# Patient Record
Sex: Male | Born: 1955 | Race: White | Hispanic: No | Marital: Married | Smoking: Former smoker
Health system: Southern US, Community
[De-identification: ages and names within clinical notes are randomized; demographics above are authoritative.]

## PROBLEM LIST (undated history)

## (undated) DIAGNOSIS — M1990 Unspecified osteoarthritis, unspecified site: Secondary | ICD-10-CM

## (undated) DIAGNOSIS — C641 Malignant neoplasm of right kidney, except renal pelvis: Secondary | ICD-10-CM

## (undated) DIAGNOSIS — I10 Essential (primary) hypertension: Secondary | ICD-10-CM

## (undated) DIAGNOSIS — T7840XA Allergy, unspecified, initial encounter: Secondary | ICD-10-CM

## (undated) DIAGNOSIS — Z800 Family history of malignant neoplasm of digestive organs: Secondary | ICD-10-CM

## (undated) DIAGNOSIS — D649 Anemia, unspecified: Secondary | ICD-10-CM

## (undated) DIAGNOSIS — Z85528 Personal history of other malignant neoplasm of kidney: Secondary | ICD-10-CM

## (undated) DIAGNOSIS — Z8042 Family history of malignant neoplasm of prostate: Secondary | ICD-10-CM

## (undated) DIAGNOSIS — K219 Gastro-esophageal reflux disease without esophagitis: Secondary | ICD-10-CM

## (undated) DIAGNOSIS — E042 Nontoxic multinodular goiter: Secondary | ICD-10-CM

## (undated) HISTORY — DX: Unspecified osteoarthritis, unspecified site: M19.90

## (undated) HISTORY — PX: CHOLECYSTECTOMY: SHX55

## (undated) HISTORY — PX: MENISECTOMY: SHX5181

## (undated) HISTORY — PX: HERNIA REPAIR: SHX51

## (undated) HISTORY — PX: THYROIDECTOMY, PARTIAL: SHX18

## (undated) HISTORY — PX: MENISCUS REPAIR: SHX5179

## (undated) HISTORY — DX: Allergy, unspecified, initial encounter: T78.40XA

## (undated) HISTORY — PX: NEPHRECTOMY RADICAL: SUR878

## (undated) HISTORY — DX: Gastro-esophageal reflux disease without esophagitis: K21.9

## (undated) HISTORY — DX: Family history of malignant neoplasm of prostate: Z80.42

## (undated) HISTORY — DX: Nontoxic multinodular goiter: E04.2

## (undated) HISTORY — PX: INCISION AND DRAINAGE / EXCISION THYROGLOSSAL CYST: SUR667

## (undated) HISTORY — DX: Personal history of other malignant neoplasm of kidney: Z85.528

## (undated) HISTORY — DX: Malignant neoplasm of right kidney, except renal pelvis: C64.1

## (undated) HISTORY — DX: Family history of malignant neoplasm of digestive organs: Z80.0

## (undated) HISTORY — DX: Anemia, unspecified: D64.9

## (undated) HISTORY — DX: Essential (primary) hypertension: I10

---

## 2019-09-02 LAB — PROTIME-INR: INR: 1.1 (ref 0.9–1.1)

## 2019-09-16 ENCOUNTER — Encounter: Payer: Self-pay | Admitting: Gastroenterology

## 2019-10-31 ENCOUNTER — Ambulatory Visit (INDEPENDENT_AMBULATORY_CARE_PROVIDER_SITE_OTHER): Payer: Medicare Other | Admitting: Gastroenterology

## 2019-10-31 ENCOUNTER — Encounter (INDEPENDENT_AMBULATORY_CARE_PROVIDER_SITE_OTHER): Payer: Self-pay

## 2019-10-31 ENCOUNTER — Encounter: Payer: Self-pay | Admitting: Gastroenterology

## 2019-10-31 VITALS — BP 136/68 | HR 93 | Ht 77.0 in | Wt 270.0 lb

## 2019-10-31 DIAGNOSIS — D509 Iron deficiency anemia, unspecified: Secondary | ICD-10-CM

## 2019-10-31 MED ORDER — SUTAB 1479-225-188 MG PO TABS: 1.0000 | ORAL_TABLET | Freq: Once | ORAL | 0 refills | Status: AC

## 2019-10-31 NOTE — Patient Instructions (Signed)
If you are age 66 or older, your body mass index should be between 23-30. Your Body mass index is 32.02 kg/m. If this is out of the aforementioned range listed, please consider follow up with your Primary Care Provider.  If you are age 58 or younger, your body mass index should be between 19-25. Your Body mass index is 32.02 kg/m. If this is out of the aformentioned range listed, please consider follow up with your Primary Care Provider.   You have been scheduled for an endoscopy and colonoscopy. Please follow the written instructions given to you at your visit today. Please pick up your prep supplies at the pharmacy within the next 1-3 days. If you use inhalers (even only as needed), please bring them with you on the day of your procedure.  Due to recent changes in healthcare laws, you may see the results of your imaging and laboratory studies on MyChart before your provider has had a chance to review them.  We understand that in some cases there may be results that are confusing or concerning to you. Not all laboratory results come back in the same time frame and the provider may be waiting for multiple results in order to interpret others.  Please give [place] 48 hours in order for your provider to thoroughly review all the results before contacting the office for clarification of your results.

## 2019-10-31 NOTE — Progress Notes (Signed)
10/31/2019 John [name] [id number] April 18, 1955   HISTORY OF PRESENT ILLNESS: This is a pleasant 64 year old male who is new to our practice.  He has been referred here by his PCP, Dr. Tawny Asal, for evaluation regarding new issues with anemia.  He tells me that he was supposed to have a knee replacement performed, but now they will not clear him because he is anemic.  He tells me that last year his hemoglobin was around 14 g.  Then when it was checked several months ago it was down to 12.6 g.  More recently in [name] it was down to 10.6 g.  MCV is low at 78.4.  Iron studies are low with serum iron 14, iron saturation less than 4.  TIBC is elevated at 414.  He tells me that recently he has seen a little bit of bright red blood on the toilet paper and has seen that on occasion in the past, but no significant amounts of bleeding.  No black or tarry looking stools.  He admits to NSAID use on and off for arthritic type pains.  He has never had EGD and colonoscopy in the past.  His PCP recommended that he take iron supplements twice daily.  He admits that he is only started them once a day because he had issues with taking iron supplements back in the 90s when he had his nephrectomy performed.   Past Medical History:  Diagnosis Date  . Anemia   . Arthritis   . Cancer of right kidney ([place])   . Hypertension   . Multiple thyroid nodules    Past Surgical History:  Procedure Laterality Date  . CHOLECYSTECTOMY    . HERNIA REPAIR    . INCISION AND DRAINAGE / EXCISION THYROGLOSSAL CYST    . MENISCUS REPAIR Left   . MENISECTOMY Right   . NEPHRECTOMY RADICAL    . THYROIDECTOMY, PARTIAL      reports that he quit smoking about 28 years ago. He has never used smokeless tobacco. He reports current alcohol use. He reports that he does not use drugs. family history includes Colon cancer in his maternal grandfather; Prostate cancer in his maternal grandfather; Stroke in his maternal grandfather; Valvular heart  disease in his father. Allergies  Allergen Reactions  . Lisinopril Cough      Outpatient Encounter Medications as of 10/31/2019  Medication Sig  . Ferrous Sulfate (IRON PO) Take 1 tablet by mouth daily.  Marland Kitchen telmisartan (MICARDIS) 40 MG tablet Take 40 mg by mouth daily.   No facility-administered encounter medications on file as of 10/31/2019.    REVIEW OF SYSTEMS  : All other systems reviewed and negative except where noted in the History of Present Illness.  PHYSICAL EXAM: BP 136/68   Pulse 93   Ht 6\' 5"  (1.956 m)   Wt 270 lb (122.5 kg)   SpO2 98%   BMI 32.02 kg/m  General: Well developed white male in no acute distress Head: Normocephalic and atraumatic Eyes:  Sclerae anicteric, conjunctiva pink. Ears: Normal auditory acuity Lungs: Clear throughout to auscultation; no W/R/R. Heart: Regular rate and rhythm; no M/R/G. Abdomen: Soft, non-distended.  BS present.  Non-tender.  Previous laparotomy scar noted. Rectal:  Will be done at the time of colonoscopy. Musculoskeletal: Symmetrical with no gross deformities  Skin: No lesions on visible extremities Extremities: No edema  Neurological: Alert oriented x 4, grossly non-focal Psychological:  Alert and cooperative. Normal mood and affect  ASSESSMENT AND PLAN: *IDA: He  reports that hemoglobin just over a year ago was 14 grams, then when it was checked several months ago it was down to 12.6 grams and then more recently in [name] it was down to 10.6 g.  MCV is low at 78.  Iron studies are very low with serum iron 14, iron saturation less than 4, TIBC elevated at 414.  It was recommended that he take iron supplements twice daily, but he admits that he has only started taking it once once a day as he had issues with taking iron back in the 90s after his nephrectomy.  He has never had colonoscopy or endoscopy in the past.  We will plan for the both of those with Dr. Bryan Lemma to look for causes of anemia/GI blood loss.  We discussed the  possibility of trying IV iron.  He would like to discuss with his PCP first to see if there somewhere more local in [place], [place] that he could have that performed.The risks, benefits, and alternatives to colonoscopy and EGD were discussed with the patient and he consents to proceed.    CC:  Josem Kaufmann, MD

## 2019-11-01 ENCOUNTER — Telehealth: Payer: Self-pay | Admitting: Gastroenterology

## 2019-11-01 NOTE — Telephone Encounter (Signed)
LMOM for patient to call back.

## 2019-11-01 NOTE — Telephone Encounter (Signed)
LMOM #2 for patient to call back

## 2019-11-01 NOTE — Telephone Encounter (Signed)
Pt Is requesting a call back from a nurse to discuss his iron infusion.  Pt is available after 2pm

## 2019-11-04 NOTE — Telephone Encounter (Signed)
Spoke to patient who would like to start iron infusions per Jessica's Zehr's last OV note. Message sent to [facility] for new orders.

## 2019-11-04 NOTE — Progress Notes (Signed)
Agree with the assessment and plan as outlined by Alonza Bogus, PA-C.  Plan for evaluation of IDA with EGD with biopsies and colonoscopy as outlined.  Treatment with IV iron as outlined.  Sharna Gabrys, DO, [facility]

## 2019-11-05 ENCOUNTER — Telehealth: Payer: Self-pay

## 2019-11-05 ENCOUNTER — Other Ambulatory Visit: Payer: Self-pay | Admitting: Gastroenterology

## 2019-11-05 DIAGNOSIS — D509 Iron deficiency anemia, unspecified: Secondary | ICD-10-CM

## 2019-11-05 NOTE — Telephone Encounter (Signed)
Spoke to patient to inform him that a referral has been sent to hematology/oncology. They will notify him of a date and time for his initial iron infusion,then repeat in 2 weeks. All questions answered. Patient voiced understanding.

## 2019-11-05 NOTE — Telephone Encounter (Signed)
-----   Message from Loralie Champagne, PA-C sent at 11/05/2019 11:00 AM EDT ----- Regarding: RE: iron infusion That is fine.  I am used to using Feraheme with one dose and repeat in 2 weeks, but I know there had been issues with insurances covering it.  I guess start with that unless you see it is not covered then I guess just see what will be covered by his insurance.  Thank you,  Jess  ----- Message ----- From: Angie Fava, LPN Sent: 1/96/2229   2:19 PM EDT To: Loralie Champagne, PA-C Subject: iron infusion                                  Hey Jess, You saw this patient last week for IDA. It was discussed the possibility of iron infusions. Patient states that he is ready to start them. If you will send me the order then I'll get that set up. He will plan to do them here rather then [place].  Thanks, [employer]

## 2019-11-20 ENCOUNTER — Telehealth: Payer: Self-pay | Admitting: Gastroenterology

## 2019-11-20 NOTE — Telephone Encounter (Signed)
Pt called to cancel appt for blood work on 10/13 at 1:00pm. He was not sure of who to contact to cancel.

## 2019-11-21 ENCOUNTER — Encounter: Payer: Self-pay | Admitting: Gastroenterology

## 2019-11-21 NOTE — Telephone Encounter (Signed)
Spoke to patient who states he needs to cancel his iron infusion appointment for next week. He was asked to contact that office to let them know. The number was provided to patient. He will contact them and reschedule.

## 2019-11-27 ENCOUNTER — Inpatient Hospital Stay: Payer: Medicare Other

## 2019-11-27 ENCOUNTER — Ambulatory Visit: Payer: Medicare Other | Admitting: Family

## 2019-12-04 ENCOUNTER — Other Ambulatory Visit: Payer: Self-pay | Admitting: Gastroenterology

## 2019-12-04 ENCOUNTER — Ambulatory Visit (AMBULATORY_SURGERY_CENTER): Payer: Medicare Other | Admitting: Gastroenterology

## 2019-12-04 ENCOUNTER — Other Ambulatory Visit: Payer: Self-pay

## 2019-12-04 ENCOUNTER — Encounter: Payer: Self-pay | Admitting: Gastroenterology

## 2019-12-04 ENCOUNTER — Other Ambulatory Visit: Payer: Medicare Other

## 2019-12-04 ENCOUNTER — Telehealth: Payer: Self-pay | Admitting: Gastroenterology

## 2019-12-04 VITALS — BP 114/58 | HR 76 | Temp 98.2°F | Resp 18 | Ht 77.0 in | Wt 270.0 lb

## 2019-12-04 DIAGNOSIS — K6389 Other specified diseases of intestine: Secondary | ICD-10-CM

## 2019-12-04 DIAGNOSIS — C187 Malignant neoplasm of sigmoid colon: Secondary | ICD-10-CM

## 2019-12-04 DIAGNOSIS — Z1211 Encounter for screening for malignant neoplasm of colon: Secondary | ICD-10-CM

## 2019-12-04 DIAGNOSIS — K219 Gastro-esophageal reflux disease without esophagitis: Secondary | ICD-10-CM

## 2019-12-04 DIAGNOSIS — K5730 Diverticulosis of large intestine without perforation or abscess without bleeding: Secondary | ICD-10-CM

## 2019-12-04 DIAGNOSIS — K269 Duodenal ulcer, unspecified as acute or chronic, without hemorrhage or perforation: Secondary | ICD-10-CM

## 2019-12-04 DIAGNOSIS — D509 Iron deficiency anemia, unspecified: Secondary | ICD-10-CM

## 2019-12-04 DIAGNOSIS — K5140 Inflammatory polyps of colon without complications: Secondary | ICD-10-CM | POA: Diagnosis not present

## 2019-12-04 DIAGNOSIS — K922 Gastrointestinal hemorrhage, unspecified: Secondary | ICD-10-CM

## 2019-12-04 DIAGNOSIS — D125 Benign neoplasm of sigmoid colon: Secondary | ICD-10-CM

## 2019-12-04 DIAGNOSIS — K259 Gastric ulcer, unspecified as acute or chronic, without hemorrhage or perforation: Secondary | ICD-10-CM

## 2019-12-04 DIAGNOSIS — K642 Third degree hemorrhoids: Secondary | ICD-10-CM

## 2019-12-04 DIAGNOSIS — K3189 Other diseases of stomach and duodenum: Secondary | ICD-10-CM | POA: Diagnosis not present

## 2019-12-04 DIAGNOSIS — K2970 Gastritis, unspecified, without bleeding: Secondary | ICD-10-CM | POA: Diagnosis not present

## 2019-12-04 DIAGNOSIS — K209 Esophagitis, unspecified without bleeding: Secondary | ICD-10-CM

## 2019-12-04 HISTORY — PX: UPPER GASTROINTESTINAL ENDOSCOPY: SHX188

## 2019-12-04 HISTORY — PX: COLONOSCOPY: SHX174

## 2019-12-04 MED ORDER — SUCRALFATE 1 G PO TABS: 1.0000 g | ORAL_TABLET | Freq: Four times a day (QID) | ORAL | 0 refills | Status: DC

## 2019-12-04 MED ORDER — PANTOPRAZOLE SODIUM 40 MG PO TBEC: 40.0000 mg | DELAYED_RELEASE_TABLET | Freq: Two times a day (BID) | ORAL | 3 refills | Status: DC

## 2019-12-04 MED ORDER — SODIUM CHLORIDE 0.9 % IV SOLN: 500.0000 mL | Freq: Once | INTRAVENOUS | Status: DC

## 2019-12-04 NOTE — Progress Notes (Signed)
A/ox3, pleased with MAC, report to RN 

## 2019-12-04 NOTE — Op Note (Signed)
[facility] Patient Name: John [name] Procedure Date: 12/04/2019 11:19 AM MRN: [id number] Endoscopist: Gerrit Heck , MD Age: 64 Referring MD:  Date of Birth: [birth year] Gender: Male Account #: [id number] Procedure:                Upper GI endoscopy Indications:              Iron deficiency anemia Medicines:                Monitored Anesthesia Care Procedure:                Pre-Anesthesia Assessment:                           - Prior to the procedure, a History and Physical                            was performed, and patient medications and                            allergies were reviewed. The patient's tolerance of                            previous anesthesia was also reviewed. The risks                            and benefits of the procedure and the sedation                            options and risks were discussed with the patient.                            All questions were answered, and informed consent                            was obtained. Prior Anticoagulants: The patient has                            taken no previous anticoagulant or antiplatelet                            agents. ASA Grade Assessment: II - A patient with                            mild systemic disease. After reviewing the risks                            and benefits, the patient was deemed in                            satisfactory condition to undergo the procedure.                           After obtaining informed consent, the endoscope was  passed under direct vision. Throughout the                            procedure, the patient's blood pressure, pulse, and                            oxygen saturations were monitored continuously. The                            Endoscope was introduced through the mouth, and                            advanced to the second part of duodenum. The upper                            GI endoscopy was accomplished  without difficulty.                            The patient tolerated the procedure well. Scope In: Scope Out: Findings:                 Moderately severe esophagitis with no bleeding was                            found at the gastroesophageal junction. This was                            characterized by erythematous, edematous mucosa.                            Biopsies were taken with a cold forceps for                            histology. Estimated blood loss was minimal.                           The upper third of the esophagus and middle third                            of the esophagus were normal.                           Hematin noted in the stomach upon entry. This was                            easily lavaged and cleared with suction. No active                            bleeding noted. Diffuse severe inflammation                            characterized by congestion (edema), erythema, and  multiple shallow ulcerations was found in the                            entire examined stomach. Several of the ulcers had                            pigmented material. Biopsies were taken with a cold                            forceps for histology. Estimated blood loss was                            minimal.                           One non-bleeding superficial duodenal ulcer was                            found in the duodenal bulb. The lesion was 4 mm in                            largest dimension and without high grade stigmata.                           The second portion of the duodenum was normal. Complications:            No immediate complications. Estimated Blood Loss:     Estimated blood loss was minimal. Impression:               - Esophagitis with no bleeding. Biopsied.                           - Normal upper third of esophagus and middle third                            of esophagus.                           - Gastritis. Biopsied.                            - Non-bleeding duodenal ulcer.                           - Normal second portion of the duodenum. Recommendation:           - Patient has a contact number available for                            emergencies. The signs and symptoms of potential                            delayed complications were discussed with the                            patient. Return to normal activities  tomorrow.                            Written discharge instructions were provided to the                            patient.                           - Resume previous diet.                           - Continue present medications.                           - Await pathology results.                           - Use Protonix (pantoprazole) 40 mg PO BID for 8                            weeks.                           - Use sucralfate tablets 1 gram PO QID for 4 weeks.                           - No ibuprofen, naproxen, or other non-steroidal                            anti-inflammatory drugs.                           - Perform a colonoscopy today.                           - Follow-up in the GI clinic to discuss role and                            timing of repeat EGD pending pathology results and                            colonoscopy findings.                           - IV iron as previously ordered. Gerrit Heck, MD 12/04/2019 12:09:17 PM

## 2019-12-04 NOTE — Telephone Encounter (Signed)
Pt called stating that he had 4 calls from [place] within the past 40 minutes. Pt had a procedure with Dr. Loletha Grayer this morning. However, nobody from the endoscopy center called him. If you call him, would you please call him again?

## 2019-12-04 NOTE — Progress Notes (Signed)
Called to room to assist during endoscopic procedure.  Patient ID and intended procedure confirmed with present staff. Received instructions for my participation in the procedure from the performing physician.  

## 2019-12-04 NOTE — Patient Instructions (Signed)
Handouts given:  Peptic ulcer, polyps Resume previous diet Continue current medications Await pathology results Perform CT of chest, abdomen and pelvis with contrast Go to lab today for CEA, BUN and creatinine  YOU HAD AN ENDOSCOPIC PROCEDURE TODAY AT [facility]:   Refer to the procedure report that was given to you for any specific questions about what was found during the examination.  If the procedure report does not answer your questions, please call your gastroenterologist to clarify.  If you requested that your care partner not be given the details of your procedure findings, then the procedure report has been included in a sealed envelope for you to review at your convenience later.  YOU SHOULD EXPECT: Some feelings of bloating in the abdomen. Passage of more gas than usual.  Walking can help get rid of the air that was put into your GI tract during the procedure and reduce the bloating. If you had a lower endoscopy (such as a colonoscopy or flexible sigmoidoscopy) you may notice spotting of blood in your stool or on the toilet paper. If you underwent a bowel prep for your procedure, you may not have a normal bowel movement for a few days.  Please Note:  You might notice some irritation and congestion in your nose or some drainage.  This is from the oxygen used during your procedure.  There is no need for concern and it should clear up in a day or so.  SYMPTOMS TO REPORT IMMEDIATELY:   Following lower endoscopy (colonoscopy or flexible sigmoidoscopy):  Excessive amounts of blood in the stool  Significant tenderness or worsening of abdominal pains  Swelling of the abdomen that is new, acute  Fever of 100F or higher   Following upper endoscopy (EGD)  Vomiting of blood or coffee ground material  New chest pain or pain under the shoulder blades  Painful or persistently difficult swallowing  New shortness of breath  Fever of 100F or higher  Black, tarry-looking  stools  For urgent or emergent issues, a gastroenterologist can be reached at any hour by calling [phone]. Do not use MyChart messaging for urgent concerns.    DIET:  We do recommend a small meal at first, but then you may proceed to your regular diet.  Drink plenty of fluids but you should avoid alcoholic beverages for 24 hours.  ACTIVITY:  You should plan to take it easy for the rest of today and you should NOT DRIVE or use heavy machinery until tomorrow (because of the sedation medicines used during the test).    FOLLOW UP: Our staff will call the number listed on your records 48-72 hours following your procedure to check on you and address any questions or concerns that you may have regarding the information given to you following your procedure. If we do not reach you, we will leave a message.  We will attempt to reach you two times.  During this call, we will ask if you have developed any symptoms of COVID 19. If you develop any symptoms (ie: fever, flu-like symptoms, shortness of breath, cough etc.) before then, please call [phone].  If you test positive for Covid 19 in the 2 weeks post procedure, please call and report this information to [place].    If any biopsies were taken you will be contacted by phone or by letter within the next 1-3 weeks.  Please call us at [phone] if you have not heard about the biopsies in 3 weeks.  SIGNATURES/CONFIDENTIALITY: You and/or your care partner have signed paperwork which will be entered into your electronic medical record.  These signatures attest to the fact that that the information above on your After Visit Summary has been reviewed and is understood.  Full responsibility of the confidentiality of this discharge information lies with you and/or your care-partner.

## 2019-12-04 NOTE — Progress Notes (Signed)
Pt's states no medical or surgical changes since previsit or office visit.  Vitals SM 

## 2019-12-04 NOTE — Op Note (Signed)
[facility] Patient Name: John [name] Procedure Date: 12/04/2019 11:18 AM MRN: [id number] Endoscopist: Gerrit Heck , MD Age: 64 Referring MD:  Date of Birth: 1955-11-08 Gender: Male Account #: [id number] Procedure:                Colonoscopy Indications:              Hematochezia, Iron deficiency anemia Medicines:                Monitored Anesthesia Care Procedure:                Pre-Anesthesia Assessment:                           - Prior to the procedure, a History and Physical                            was performed, and patient medications and                            allergies were reviewed. The patient's tolerance of                            previous anesthesia was also reviewed. The risks                            and benefits of the procedure and the sedation                            options and risks were discussed with the patient.                            All questions were answered, and informed consent                            was obtained. Prior Anticoagulants: The patient has                            taken no previous anticoagulant or antiplatelet                            agents. ASA Grade Assessment: II - A patient with                            mild systemic disease. After reviewing the risks                            and benefits, the patient was deemed in                            satisfactory condition to undergo the procedure.                           After obtaining informed consent, the colonoscope  was passed under direct vision. Throughout the                            procedure, the patient's blood pressure, pulse, and                            oxygen saturations were monitored continuously. The                            Colonoscope was introduced through the anus and                            advanced to the the sigmoid colon. The colonoscopy                            was technically  difficult and complex due to a                            obstructing mass in the sigmoid that could not be                            traversed. The patient tolerated the procedure                            well. The quality of the bowel preparation was                            good. The rectum was photographed. Scope In: 11:39:56 AM Scope Out: 12:01:11 PM Total Procedure Duration: 0 hours 21 minutes 15 seconds  Findings:                 Hemorrhoids were found on perianal exam.                           A frond-like/villous and ulcerated completely                            obstructing large mass was found in the sigmoid                            colon. The mass was circumferential. Oozing was                            present. This was not traversed. This was biopsied                            with a cold forceps for histology. Area 2-3 cm                            distal to the lesion was was tattooed with an                            injection of 2 mL of Spot (carbon black).  A 5 mm polyp was found in the sigmoid colon. The                            polyp was sessile. The polyp was removed with a                            cold snare. Resection and retrieval were complete.                            Estimated blood loss was minimal.                           Many small and large-mouthed diverticula were found                            in the sigmoid colon.                           Non-bleeding internal hemorrhoids were found during                            retroflexion. The hemorrhoids were medium-sized and                            Grade III (internal hemorrhoids that prolapse but                            require manual reduction). Complications:            No immediate complications. Estimated Blood Loss:     Estimated blood loss was minimal. Impression:               - Hemorrhoids found on perianal exam.                           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 Biopsied. Tattooed.                           - One 5 mm polyp in the sigmoid colon, removed with                            a cold snare. Resected and retrieved.                           - Diverticulosis in the sigmoid colon.                           - Non-bleeding internal hemorrhoids. Recommendation:           - Patient has a contact number available for                            emergencies. The  signs and symptoms of potential                            delayed complications were discussed with the                            patient. Return to normal activities tomorrow.                            Written discharge instructions were provided to the                            patient.                           - Resume previous diet.                           - Continue present medications.                           - Await pathology results.                           - Perform a CT scan (computed tomography) of chest                            with contrast, abdomen with contrast and pelvis                            with contrast at the next available appointment.                           - Check CEA at the next available appointment.                           - Refer to a colo-rectal surgeon at appointment to                            be scheduled.                           - Refer to an oncologist at appointment to be                            scheduled.                           - Return to GI office in 2-3 weeks. Gerrit Heck, MD 12/04/2019 12:16:09 PM

## 2019-12-04 NOTE — Progress Notes (Signed)
Order for labs placed per post -op nurse

## 2019-12-05 ENCOUNTER — Encounter: Payer: Self-pay | Admitting: *Deleted

## 2019-12-05 ENCOUNTER — Telehealth: Payer: Self-pay | Admitting: Gastroenterology

## 2019-12-05 LAB — BUN/CREATININE RATIO
BUN: 12 mg/dL (ref 7–25)
Creat: 1.14 mg/dL (ref 0.70–1.25)
GFR, Est African American: 78 mL/min/{1.73_m2} (ref >=60)
GFR, Est Non African American: 67 mL/min/{1.73_m2} (ref >=60)

## 2019-12-05 LAB — CEA: CEA: <0.5 ng/mL

## 2019-12-05 NOTE — Telephone Encounter (Signed)
LMOM for patient to call back.

## 2019-12-05 NOTE — Progress Notes (Signed)
Reached out to Joesph July to introduce myself as the office RN Navigator and explain our new patient process. Reviewed the reason for their referral and scheduled their new patient appointment along with labs. Provided address and directions to the office including call back phone number. Reviewed with patient any concerns they may have or any possible barriers to attending their appointment.   Informed patient about my role as a navigator and that I will meet with them prior to their New Patient appointment and more fully discuss what services I can provide. At this time patient has no further questions or needs.   Oncology Nurse Navigator Documentation  Oncology Nurse Navigator Flowsheets 12/05/2019  Abnormal Finding Date 12/04/2019  Diagnosis Status Additional Work Up  Navigator Follow Up Date: 12/11/2019  Navigator Follow Up Reason: New Patient Appointment  Navigator Location [facility]  Referral Date to RadOnc/MedOnc 12/04/2019  Navigator Encounter Type Introductory Phone Call  Patient Visit Type MedOnc  Treatment Phase Pre-Tx/Tx Discussion  Barriers/Navigation Needs Coordination of Care;Education  Education Other  Interventions Coordination of Care;Education  Acuity Level 2-Minimal Needs (1-2 Barriers Identified)  Coordination of Care Appts  Education Method Verbal;Teach-back  Time Spent with Patient 35

## 2019-12-05 NOTE — Telephone Encounter (Signed)
Pt is requesting a call back from a nurse to discuss the CT scans he has scheduled.Marland Kitchen

## 2019-12-06 ENCOUNTER — Telehealth: Payer: Self-pay

## 2019-12-06 NOTE — Telephone Encounter (Signed)
  Follow up Call-  Call back number 12/04/2019  Post procedure Call Back phone  # [phone]  Permission to leave phone message Yes     Patient questions:  Do you have a fever, pain , or abdominal swelling? No. Pain Score  0 *  Have you tolerated food without any problems? Yes.    Have you been able to return to your normal activities? Yes.    Do you have any questions about your discharge instructions: Diet   No. Medications  No. Follow up visit  No.  Do you have questions or concerns about your Care? No.  Actions: * If pain score is 4 or above: No action needed, pain <4.  1. Have you developed a fever since your procedure? no  2.   Have you had an respiratory symptoms (SOB or cough) since your procedure? no  3.   Have you tested positive for COVID 19 since your procedure no  4.   Have you had any family members/close contacts diagnosed with the COVID 19 since your procedure?  no   If yes to any of these questions please route to Joylene John, RN and Joella Prince, RN

## 2019-12-11 ENCOUNTER — Encounter: Payer: Self-pay | Admitting: *Deleted

## 2019-12-11 ENCOUNTER — Encounter: Payer: Self-pay | Admitting: Hematology & Oncology

## 2019-12-11 ENCOUNTER — Inpatient Hospital Stay: Payer: Medicare Other | Attending: Hematology & Oncology

## 2019-12-11 ENCOUNTER — Inpatient Hospital Stay (HOSPITAL_BASED_OUTPATIENT_CLINIC_OR_DEPARTMENT_OTHER): Payer: Medicare Other | Admitting: Hematology & Oncology

## 2019-12-11 ENCOUNTER — Other Ambulatory Visit: Payer: Self-pay

## 2019-12-11 DIAGNOSIS — C187 Malignant neoplasm of sigmoid colon: Secondary | ICD-10-CM | POA: Diagnosis present

## 2019-12-11 DIAGNOSIS — Z87891 Personal history of nicotine dependence: Secondary | ICD-10-CM

## 2019-12-11 DIAGNOSIS — Z8249 Family history of ischemic heart disease and other diseases of the circulatory system: Secondary | ICD-10-CM

## 2019-12-11 DIAGNOSIS — Z823 Family history of stroke: Secondary | ICD-10-CM | POA: Insufficient documentation

## 2019-12-11 DIAGNOSIS — Z79899 Other long term (current) drug therapy: Secondary | ICD-10-CM | POA: Insufficient documentation

## 2019-12-11 DIAGNOSIS — Z8042 Family history of malignant neoplasm of prostate: Secondary | ICD-10-CM | POA: Insufficient documentation

## 2019-12-11 DIAGNOSIS — K922 Gastrointestinal hemorrhage, unspecified: Secondary | ICD-10-CM | POA: Diagnosis not present

## 2019-12-11 DIAGNOSIS — D500 Iron deficiency anemia secondary to blood loss (chronic): Secondary | ICD-10-CM | POA: Diagnosis not present

## 2019-12-11 DIAGNOSIS — Z888 Allergy status to other drugs, medicaments and biological substances status: Secondary | ICD-10-CM | POA: Diagnosis not present

## 2019-12-11 DIAGNOSIS — Z905 Acquired absence of kidney: Secondary | ICD-10-CM

## 2019-12-11 DIAGNOSIS — C185 Malignant neoplasm of splenic flexure: Secondary | ICD-10-CM

## 2019-12-11 DIAGNOSIS — Z9049 Acquired absence of other specified parts of digestive tract: Secondary | ICD-10-CM | POA: Diagnosis not present

## 2019-12-11 DIAGNOSIS — Z85528 Personal history of other malignant neoplasm of kidney: Secondary | ICD-10-CM | POA: Diagnosis not present

## 2019-12-11 DIAGNOSIS — Z800 Family history of malignant neoplasm of digestive organs: Secondary | ICD-10-CM

## 2019-12-11 DIAGNOSIS — C189 Malignant neoplasm of colon, unspecified: Secondary | ICD-10-CM

## 2019-12-11 HISTORY — DX: Malignant neoplasm of colon, unspecified: C18.9

## 2019-12-11 HISTORY — DX: Gastrointestinal hemorrhage, unspecified: K92.2

## 2019-12-11 LAB — CBC WITH DIFFERENTIAL (CANCER CENTER ONLY)
Abs Immature Granulocytes: 0.03 10*3/uL (ref 0.00–0.07)
Basophils Absolute: 0.1 10*3/uL (ref 0.0–0.1)
Basophils Relative: 1 %
Eosinophils Absolute: 0.1 10*3/uL (ref 0.0–0.5)
Eosinophils Relative: 2 %
HCT: 35.5 % — ABNORMAL LOW (ref 39.0–52.0)
Hemoglobin: 10.9 g/dL — ABNORMAL LOW (ref 13.0–17.0)
Immature Granulocytes: 0 %
Lymphocytes Relative: 23 %
Lymphs Abs: 1.8 10*3/uL (ref 0.7–4.0)
MCH: 25.2 pg — ABNORMAL LOW (ref 26.0–34.0)
MCHC: 30.7 g/dL (ref 30.0–36.0)
MCV: 82.2 fL (ref 80.0–100.0)
Monocytes Absolute: 0.7 10*3/uL (ref 0.1–1.0)
Monocytes Relative: 9 %
Neutro Abs: 5.2 10*3/uL (ref 1.7–7.7)
Neutrophils Relative %: 65 %
Platelet Count: 388 10*3/uL (ref 150–400)
RBC: 4.32 MIL/uL (ref 4.22–5.81)
RDW: 16.2 % — ABNORMAL HIGH (ref 11.5–15.5)
WBC Count: 8 10*3/uL (ref 4.0–10.5)
nRBC: 0 % (ref 0.0–0.2)

## 2019-12-11 LAB — CMP (CANCER CENTER ONLY)
ALT: 11 U/L (ref 0–44)
AST: 10 U/L — ABNORMAL LOW (ref 15–41)
Albumin: 4 g/dL (ref 3.5–5.0)
Alkaline Phosphatase: 64 U/L (ref 38–126)
Anion gap: 7 (ref 5–15)
BUN: 14 mg/dL (ref 8–23)
CO2: 27 mmol/L (ref 22–32)
Calcium: 9.3 mg/dL (ref 8.9–10.3)
Chloride: 104 mmol/L (ref 98–111)
Creatinine: 1.1 mg/dL (ref 0.61–1.24)
GFR, Estimated: >60 mL/min (ref >60)
Glucose, Bld: 107 mg/dL — ABNORMAL HIGH (ref 70–99)
Potassium: 4.1 mmol/L (ref 3.5–5.1)
Sodium: 138 mmol/L (ref 135–145)
Total Bilirubin: 0.3 mg/dL (ref 0.3–1.2)
Total Protein: 6.9 g/dL (ref 6.5–8.1)

## 2019-12-11 LAB — LACTATE DEHYDROGENASE: LDH: 107 U/L (ref 98–192)

## 2019-12-11 NOTE — Progress Notes (Signed)
Initial RN Navigator Patient Visit  Name: John [name] Date of Referral : 12/04/19 Diagnosis: Colorectal Cancer  Met with patient prior to their visit with MD. [name] patient "Your Patient Navigator" handout which explains my role, areas in which I am able to help, and all the contact information for myself and the office. Also gave patient MD and Navigator business card. Reviewed with patient the general overview of expected course after initial diagnosis and time frame for all steps to be completed.  New patient packet given to patient which includes: orientation to office and staff; campus directory; education on My Chart and Advance Directives; and patient centered education on Colorectal Cancer.   Patient completed visit with Dr. Marin Olp.   Revisited with patient after MD visit.    Patient is already scheduled for CT scans on 12/13/19 and a consultation with the surgeon on 12/17/19.   Patient will need iron infusions. Since patient lives in [place], we will try to coordinate these infusions with existing appointments.   Patient understands all follow up procedures and expectations. They have my number to reach out for any further clarification or additional needs.   Oncology Nurse Navigator Documentation  Oncology Nurse Navigator Flowsheets 12/11/2019  Abnormal Finding Date -  Diagnosis Status -  Navigator Follow Up Date: 12/13/2019  Navigator Follow Up Reason: Scan Review  Navigator Location [facility]  Referral Date to RadOnc/MedOnc 12/04/2019  Navigator Encounter Type Initial MedOnc  Patient Visit Type MedOnc  Treatment Phase Pre-Tx/Tx Discussion  Barriers/Navigation Needs Coordination of Care;Education  Education Newly Diagnosed Cancer Education;Other  Interventions Coordination of Care;Education;Psycho-Social Support  Acuity Level 2-Minimal Needs (1-2 Barriers Identified)  Coordination of Care Appts  Education Method Verbal;Written  Support Groups/Services  Friends and Family  Time Spent with Patient -

## 2019-12-11 NOTE — Progress Notes (Signed)
Referral MD  Reason for Referral: Adenocarcinoma of the sigmoid colon; iron deficiency anemia  Chief Complaint  Patient presents with  . New Patient (Initial Visit)    Colon cancer diagnosis  : I have colon cancer.  HPI: [name] is a really nice 64 year old white male.  He is from [place], [place].  Comes in with his wife.  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  He is showing some of his craft.  I am very impressed with his ability.  He has never had a colonoscopy.  He was in the process of getting ready for surgery for his right knee.  His family doctor noted that his hemoglobin was going down gradually.  This was worked up.  He was found to be iron deficient.  Since he never had a colonoscopy, he was referred to gastroenterology.  Dr. Bryan Lemma went ahead and did a colonoscopy on him.  This was done on 12/04/2019.  Of note, an upper endoscopy was also done.  He probably was found to have a mass in the sigmoid colon.  This was obstructing.  The colonoscopy could not be passed through this.  There was some oozing.  Biopsies were taken.  The pathology report ([id number]) showed an adenocarcinoma.  He was kindly referred to the [facility] for an evaluation.  He is going to be seen surgical oncology next week.  He has had no problems with bowels or bladder.  There is no diarrhea.  There is no constipation.  He had no melena or hematochezia.  There was no abdominal pain.  There is no weight loss.  His appetite has been good.  There really is no family history of colon cancer.  A maternal grandfather had colon cancer and prostate cancer at age 26.  Mr. Greek has had multiple surgeries in the past.  He actually had kidney cancer 24 years ago.  His left kidney was removed in addition to his gallbladder.  He has had no problems with his bowels or bladder.  He does have fair skin.  He has had some skin lesions removed.  He is set up for a CAT scan on  Friday.  Currently, his performance status is ECOG 0.   Past Medical History:  Diagnosis Date  . Anemia   . Arthritis   . Cancer of right kidney ([place])   . Colon cancer ([place]) 12/11/2019  . Hypertension   . Multiple thyroid nodules   :  Past Surgical History:  Procedure Laterality Date  . CHOLECYSTECTOMY    . COLONOSCOPY  12/04/2019  . HERNIA REPAIR    . INCISION AND DRAINAGE / EXCISION THYROGLOSSAL CYST    . MENISCUS REPAIR Left   . MENISECTOMY Right   . NEPHRECTOMY RADICAL    . THYROIDECTOMY, PARTIAL    . UPPER GASTROINTESTINAL ENDOSCOPY  12/04/2019  :   Current Outpatient Medications:  .  Ferrous Sulfate (IRON PO), Take 1 tablet by mouth daily., Disp: , Rfl:  .  pantoprazole (PROTONIX) 40 MG tablet, Take 1 tablet (40 mg total) by mouth 2 (two) times daily., Disp: 90 tablet, Rfl: 3 .  sucralfate (CARAFATE) 1 g tablet, Take 1 tablet (1 g total) by mouth 4 (four) times daily for 28 days., Disp: 112 tablet, Rfl: 0 .  telmisartan (MICARDIS) 40 MG tablet, Take 40 mg by mouth daily., Disp: , Rfl: :  :  Allergies  Allergen Reactions  . Lisinopril Cough  :  Family History  Problem Relation Age of Onset  . Valvular heart disease Father   . Colon cancer Maternal Grandfather   . Prostate cancer Maternal Grandfather   . Stroke Maternal Grandfather   . Colon polyps Neg Hx   . Esophageal cancer Neg Hx   . Rectal cancer Neg Hx   . Stomach cancer Neg Hx   :  Social History   Socioeconomic History  . Marital status: Married    Spouse name: Not on file  . Number of children: Not on file  . Years of education: Not on file  . Highest education level: Not on file  Occupational History  . Not on file  Tobacco Use  . Smoking status: Former Smoker    Quit date: 1993    Years since quitting: 28.8  . Smokeless tobacco: Never Used  Vaping Use  . Vaping Use: Never assessed  Substance and Sexual Activity  . Alcohol use: Yes    Comment: occ  . Drug use: Never  . Sexual  activity: Not on file  Other Topics Concern  . Not on file  Social History Narrative  . Not on file   Social Determinants of Health   Financial Resource Strain:   . Difficulty of Paying Living Expenses: Not on file  Food Insecurity:   . Worried About Charity fundraiser in the Last Year: Not on file  . Ran Out of Food in the Last Year: Not on file  Transportation Needs:   . Lack of Transportation (Medical): Not on file  . Lack of Transportation (Non-Medical): Not on file  Physical Activity:   . Days of Exercise per Week: Not on file  . Minutes of Exercise per Session: Not on file  Stress:   . Feeling of Stress : Not on file  Social Connections:   . Frequency of Communication with Friends and Family: Not on file  . Frequency of Social Gatherings with Friends and Family: Not on file  . Attends Religious Services: Not on file  . Active Member of Clubs or Organizations: Not on file  . Attends Archivist Meetings: Not on file  . Marital Status: Not on file  Intimate Partner Violence:   . Fear of Current or Ex-Partner: Not on file  . Emotionally Abused: Not on file  . Physically Abused: Not on file  . Sexually Abused: Not on file  :  Review of Systems  Constitutional: Negative.   HENT: Negative.   Eyes: Negative.   Respiratory: Negative.   Cardiovascular: Negative.   Gastrointestinal: Negative.   Genitourinary: Negative.   Musculoskeletal: Negative.   Skin: Negative.   Neurological: Negative.   Endo/Heme/Allergies: Negative.   Psychiatric/Behavioral: Negative.      Exam:  This is a well-developed and well-nourished white male in no obvious distress.  Vital signs show temperature of 97.8.  Pulse 75.  Blood pressure 126/81.  Weight is 267 pounds.  Head and neck exam shows no ocular or oral lesions.  There is no scleral icterus.  He has no adenopathy in the neck.  Thyroid is nonpalpable.  He does have a surgical scar from a hemithyroidectomy.  His lungs are clear  bilaterally.  Cardiac exam regular rate and rhythm with no murmurs, rubs or bruits.  Abdomen is soft.  He has good bowel sounds.  There is no fluid wave.  There is no palpable liver or spleen tip.  Back exam shows no tenderness over the spine, ribs or hips.  Extremities shows  no clubbing, cyanosis or edema.  Neurological exam shows no focal neurological deficits.  Skin exam shows no rashes, ecchymoses or petechia.   @IPVITALS @   Recent Labs    12/11/19 1143  WBC 8.0  HGB 10.9*  HCT 35.5*  PLT 388   Recent Labs    12/11/19 1143  NA 138  K 4.1  CL 104  CO2 27  GLUCOSE 107*  BUN 14  CREATININE 1.10  CALCIUM 9.3    Blood smear review: None  Pathology: See above    Assessment and Plan: [name] is a very nice 64 year old white male.  He has colon cancer.  This is in the sigmoid colon.  This looks like is an obstructing lesion by the colonoscopy report even though he has no bowel symptoms.  I would like to hope that this is not metastatic.  I would think that even if it is, he still will need surgery because of the obstructing nature of this lesion.  He will see surgery next week.  They will have the report from the CT scan.  I am sure that he probably has some iron deficiency still.  We will give him some IV iron when he comes on Friday after his CT scan.  Whether or not he will need any adjuvant therapy will really depend on surgical results.  I spent a good hour with he and his wife.  They are both very very nice.  His wife is a retired [employer] scientist.  She is a CCU nurse.  I answered their questions.  I have no doubt that he will do well with surgery.  We will see what the CAT scan has to show.  We will plan to get him back into the office probably after Thanksgiving depending on what the surgery finds.

## 2019-12-12 ENCOUNTER — Telehealth: Payer: Self-pay | Admitting: Hematology & Oncology

## 2019-12-12 LAB — IRON AND TIBC
Iron: 31 ug/dL — ABNORMAL LOW (ref 42–163)
Saturation Ratios: 8 % — ABNORMAL LOW (ref 20–55)
TIBC: 380 ug/dL (ref 202–409)
UIBC: 349 ug/dL (ref 117–376)

## 2019-12-12 LAB — FERRITIN: Ferritin: 7 ng/mL — ABNORMAL LOW (ref 24–336)

## 2019-12-12 LAB — CEA (IN HOUSE-CHCC): CEA (CHCC-In House): <1 ng/mL (ref 0.00–5.00)

## 2019-12-12 NOTE — Telephone Encounter (Signed)
No los 10/27

## 2019-12-13 ENCOUNTER — Ambulatory Visit (HOSPITAL_BASED_OUTPATIENT_CLINIC_OR_DEPARTMENT_OTHER)
Admission: RE | Admit: 2019-12-13 | Discharge: 2019-12-13 | Disposition: A | Discharge status: Home / Self Care | Payer: Medicare Other | Source: Ambulatory Visit | Attending: Gastroenterology | Admitting: Gastroenterology

## 2019-12-13 ENCOUNTER — Inpatient Hospital Stay: Payer: Medicare Other

## 2019-12-13 ENCOUNTER — Encounter: Payer: Self-pay | Admitting: *Deleted

## 2019-12-13 ENCOUNTER — Encounter (HOSPITAL_BASED_OUTPATIENT_CLINIC_OR_DEPARTMENT_OTHER): Payer: Self-pay

## 2019-12-13 ENCOUNTER — Other Ambulatory Visit: Payer: Self-pay

## 2019-12-13 VITALS — BP 144/64 | HR 83 | Temp 98.9°F | Resp 18

## 2019-12-13 DIAGNOSIS — C187 Malignant neoplasm of sigmoid colon: Secondary | ICD-10-CM | POA: Diagnosis present

## 2019-12-13 DIAGNOSIS — K922 Gastrointestinal hemorrhage, unspecified: Secondary | ICD-10-CM

## 2019-12-13 DIAGNOSIS — D500 Iron deficiency anemia secondary to blood loss (chronic): Secondary | ICD-10-CM

## 2019-12-13 MED ORDER — SODIUM CHLORIDE 0.9 % IV SOLN
Freq: Once | INTRAVENOUS | Status: AC
  Filled 2019-12-13: qty 250

## 2019-12-13 MED ORDER — IOHEXOL 300 MG/ML  SOLN
100.0000 mL | Freq: Once | INTRAMUSCULAR | Status: AC | PRN
  Administered 2019-12-13: 100 mL via INTRAVENOUS

## 2019-12-13 MED ORDER — SODIUM CHLORIDE 0.9 % IV SOLN
510.0000 mg | Freq: Once | INTRAVENOUS | Status: AC
  Administered 2019-12-13: 510 mg via INTRAVENOUS
  Filled 2019-12-13: qty 510

## 2019-12-13 NOTE — Addendum Note (Signed)
Addended by: Burney Gauze R on: 12/13/2019 01:10 PM   Modules accepted: Orders

## 2019-12-13 NOTE — Patient Instructions (Signed)

## 2019-12-13 NOTE — Progress Notes (Signed)
Reviewed CT scan results with Dr Marin Olp. An order for MRI will be placed to further work up questions regarding the liver.  Called patient and reviewed results. Informed him that an order for MRI was placed and will be scheduled once insurance authorization is obtained.   Oncology Nurse Navigator Documentation  Oncology Nurse Navigator Flowsheets 12/13/2019  Abnormal Finding Date -  Diagnosis Status -  Navigator Follow Up Date: 12/17/2019  Navigator Follow Up Reason: Appointment Review  Navigator Location [facility]  Referral Date to RadOnc/MedOnc -  Navigator Encounter Type Scan Review;Telephone  Telephone Outgoing Call  Patient Visit Type MedOnc  Treatment Phase Pre-Tx/Tx Discussion  Barriers/Navigation Needs Coordination of Care;Education  Education Other  Interventions Education;Psycho-Social Support  Acuity Level 2-Minimal Needs (1-2 Barriers Identified)  Coordination of Care -  Education Method Verbal  Support Groups/Services Friends and Family  Time Spent with Patient 30

## 2019-12-17 ENCOUNTER — Encounter: Payer: Self-pay | Admitting: *Deleted

## 2019-12-17 NOTE — Progress Notes (Signed)
Patient was seen by surgery this am. Awaiting MRI results to determine treatment plan. Scheduled MRI for 12/19/2019 at [facility] and spoke with patient. Reviewed appointment time, date and location. Also educated patient on NPO after midnight. He confirmed with teach back.   Oncology Nurse Navigator Documentation  Oncology Nurse Navigator Flowsheets 12/17/2019  Abnormal Finding Date -  Diagnosis Status -  Navigator Follow Up Date: 12/19/2019  Navigator Follow Up Reason: Scan Review  Navigator Location [facility]  Referral Date to RadOnc/MedOnc -  Navigator Encounter Type Appt/Treatment Plan Review;Telephone  Telephone Outgoing Call  Patient Visit Type MedOnc  Treatment Phase Pre-Tx/Tx Discussion  Barriers/Navigation Needs Coordination of Care;Education  Education Other  Interventions Education;Psycho-Social Support  Acuity Level 2-Minimal Needs (1-2 Barriers Identified)  Coordination of Care Appts  Education Method Verbal;Teach-back  Support Groups/Services Friends and Family  Time Spent with Patient 1

## 2019-12-18 ENCOUNTER — Other Ambulatory Visit: Payer: Self-pay

## 2019-12-18 ENCOUNTER — Encounter: Payer: Self-pay | Admitting: *Deleted

## 2019-12-19 ENCOUNTER — Ambulatory Visit (HOSPITAL_COMMUNITY)
Admission: RE | Admit: 2019-12-19 | Discharge: 2019-12-19 | Disposition: A | Discharge status: Home / Self Care | Payer: Medicare Other | Source: Ambulatory Visit | Attending: Hematology & Oncology | Admitting: Hematology & Oncology

## 2019-12-19 ENCOUNTER — Other Ambulatory Visit: Payer: Self-pay

## 2019-12-19 ENCOUNTER — Encounter: Payer: Self-pay | Admitting: *Deleted

## 2019-12-19 ENCOUNTER — Encounter (HOSPITAL_COMMUNITY): Payer: Self-pay

## 2019-12-19 DIAGNOSIS — C185 Malignant neoplasm of splenic flexure: Secondary | ICD-10-CM

## 2019-12-19 NOTE — Progress Notes (Signed)
Patient's MRI was cancelled this morning due to patient having had IV iron recently. Dr Marin Olp was aware of this, but wanted to proceed with MRI.  [facility] and notified them that Dr Marin Olp wanted to proceed with MRI and that we couldn't wait the recommended three months as patient's surgical plan hinged on MRI result. MRI rescheduled to first available which is Wednesday 11/10 however they recommended calling back on Monday to see if he could be worked into the schedule.   Patient is aware of plan. Dr Marin Olp and Dr Dema Severin updated.   Oncology Nurse Navigator Documentation  Oncology Nurse Navigator Flowsheets 12/19/2019  Abnormal Finding Date -  Diagnosis Status -  Navigator Follow Up Date: 12/23/2019  Navigator Follow Up Reason: Appointment Review  Navigator Location [facility]  Referral Date to RadOnc/MedOnc -  Navigator Encounter Type Appt/Treatment Plan Review;Telephone  Telephone Outgoing Call  Patient Visit Type MedOnc  Treatment Phase Pre-Tx/Tx Discussion  Barriers/Navigation Needs Coordination of Care;Education  Education Other  Interventions Education;Psycho-Social Support  Acuity Level 2-Minimal Needs (1-2 Barriers Identified)  Coordination of Care Appts  Education Method Verbal;Teach-back  Support Groups/Services Friends and Family  Time Spent with Patient 61

## 2019-12-23 ENCOUNTER — Encounter: Payer: Self-pay | Admitting: Gastroenterology

## 2019-12-23 ENCOUNTER — Inpatient Hospital Stay: Payer: Medicare Other

## 2019-12-23 ENCOUNTER — Encounter: Payer: Self-pay | Admitting: *Deleted

## 2019-12-23 ENCOUNTER — Ambulatory Visit (HOSPITAL_COMMUNITY)
Admission: RE | Admit: 2019-12-23 | Discharge: 2019-12-23 | Disposition: A | Discharge status: Home / Self Care | Payer: Medicare Other | Source: Ambulatory Visit | Attending: Hematology & Oncology | Admitting: Hematology & Oncology

## 2019-12-23 ENCOUNTER — Ambulatory Visit (INDEPENDENT_AMBULATORY_CARE_PROVIDER_SITE_OTHER): Payer: Medicare Other | Admitting: Gastroenterology

## 2019-12-23 ENCOUNTER — Other Ambulatory Visit: Payer: Self-pay

## 2019-12-23 ENCOUNTER — Telehealth: Payer: Self-pay | Admitting: *Deleted

## 2019-12-23 VITALS — BP 132/60 | HR 82 | Ht 77.0 in | Wt 269.0 lb

## 2019-12-23 DIAGNOSIS — K2970 Gastritis, unspecified, without bleeding: Secondary | ICD-10-CM | POA: Diagnosis not present

## 2019-12-23 DIAGNOSIS — D509 Iron deficiency anemia, unspecified: Secondary | ICD-10-CM | POA: Diagnosis not present

## 2019-12-23 DIAGNOSIS — C185 Malignant neoplasm of splenic flexure: Secondary | ICD-10-CM | POA: Insufficient documentation

## 2019-12-23 DIAGNOSIS — C189 Malignant neoplasm of colon, unspecified: Secondary | ICD-10-CM

## 2019-12-23 MED ORDER — GADOBUTROL 1 MMOL/ML IV SOLN
10.0000 mL | Freq: Once | INTRAVENOUS | Status: AC | PRN
  Administered 2019-12-23: 10 mL via INTRAVENOUS

## 2019-12-23 NOTE — Telephone Encounter (Signed)
As noted below by Dr. Marin Olp, I informed the wife that there is NO cancer in the liver. All they see is a blood blister. Surgery is on!. I will fax this report to Dr. Nadeen Landau. She verbalized understanding.

## 2019-12-23 NOTE — Progress Notes (Signed)
P  Chief Complaint:    Colon cancer, iron deficiency anemia  GI History: 64 year old male with newly diagnosed colon cancer on index colonoscopy for work-up of IDA.  -CEA normal -CT C/A/P: 4.8 x 4.3 x 4.4 cm sigmoid mass, multiple prominent subcentimeter lymph nodes of the sigmoid mesocolon adjacent to the mass measuring no greater than 3 mm, concerning for nodal metastatic disease.  Subtle, hypodense lesion of the lateral liver dome measuring 1.5 x 1.5 cm, suspicious for hepatic metastatic disease.  Consider MRI or PET/CT.  Multiple tiny centrilobular pulmonary nodules -MRI abdomen: Scheduled for 12/25/2019 -12/11/2019: Seen in consultation by Dr. Marin Olp in the Oncology clinic.  Treated with IV iron -12/17/2019: Seen by Dr. Dema Severin in Colorectal Surgery clinic   Endoscopic History: -EGD (12/04/2019, Dr. Bryan Lemma): Moderate esophagitis at [facility] (path: Reflux changes), severe gastritis (path: Non-H. pylori gastritis), 4 mm DU, normal D2 (path benign).  Started on Protonix and sucralfate -Colonoscopy (12/04/2019, Dr. Bryan Lemma): Obstructing mass in the sigmoid colon (path: Adenocarcinoma; tattoo placed 2-3 cm distal to the mass), inflammatory polyp, diverticulosis, grade 3 hemorrhoids  HPI:     Patient is a 64 y.o. male presenting to the Gastroenterology Clinic for follow-up. Improvement in fatigue with IV iron.  Otherwise feels well without any complaints today.  Since time of EGD/colonoscopy had appoint with Dr. Marin Olp in Oncology and Dr. Dema Severin Colorectal Surgery.  Waiting MRI which could be done later today for on 12/25/2019 (was delayed due to recent IV iron).    Review of systems:     No chest pain, no SOB, no fevers, no urinary sx   Past Medical History:  Diagnosis Date  . Anemia   . Arthritis   . Cancer of right kidney ([place])   . Colon cancer ([place]) 12/11/2019  . Hypertension   . Lower GI bleed 12/11/2019  . Multiple thyroid nodules     Patient's surgical history, family  medical history, social history, medications and allergies were all reviewed in Epic    Current Outpatient Medications  Medication Sig Dispense Refill  . pantoprazole (PROTONIX) 40 MG tablet Take 1 tablet (40 mg total) by mouth 2 (two) times daily. 90 tablet 3  . sucralfate (CARAFATE) 1 g tablet Take 1 tablet (1 g total) by mouth 4 (four) times daily for 28 days. 112 tablet 0  . telmisartan (MICARDIS) 40 MG tablet Take 40 mg by mouth daily.    . Ferrous Sulfate (IRON PO) Take 1 tablet by mouth daily. (Patient not taking: Reported on 12/23/2019)     No current facility-administered medications for this visit.    Physical Exam:     BP 132/60   Pulse 82   Ht 6\' 5"  (1.956 m)   Wt 269 lb (122 kg)   BMI 31.90 kg/m   GENERAL:  Pleasant male in NAD PSYCH: : Cooperative, normal affect NEURO: Alert and oriented x 3, no focal neurologic deficits   IMPRESSION and PLAN:    1) Colon cancer 2) Iron deficiency anemia -Awaiting MRI to further evaluate 1.5 cm lesion of the liver.  Interestingly, he and his wife state that they had an MRI years ago that was notable for hepatic hemangioma.  Hopefully this is the reason for the lesion noted on CT which would not impact cancer management -Follow-up with Dr. Marin Olp and Dr. Dema Severin as planned -GI service will otherwise remain on standby for these issues   3) Gastritis 4) Reflux esophagitis -Complete PPI BID x8 weeks then QD -Complete  Carafate x4 weeks then can discontinue -Will defer repeat endoscopy to evaluate for mucosal healing given of the more pressing issues  I spent 30 minutes of time, including in depth chart review, independent review of results as outlined above, communicating results with the patient directly, face-to-face time with the patient, coordinating care, and ordering studies and medications as appropriate, and documentation.        [place] ,DO, FACG 12/23/2019, 10:59 AM

## 2019-12-23 NOTE — Patient Instructions (Signed)
If you are age 66 or older, your body mass index should be between 23-30. Your Body mass index is 31.9 kg/m. If this is out of the aforementioned range listed, please consider follow up with your Primary Care Provider.  If you are age 63 or younger, your body mass index should be between 19-25. Your Body mass index is 31.9 kg/m. If this is out of the aformentioned range listed, please consider follow up with your Primary Care Provider.    Due to recent changes in healthcare laws, you may see the results of your imaging and laboratory studies on MyChart before your provider has had a chance to review them.  We understand that in some cases there may be results that are confusing or concerning to you. Not all laboratory results come back in the same time frame and the provider may be waiting for multiple results in order to interpret others.  Please give [place] 48 hours in order for your provider to thoroughly review all the results before contacting the office for clarification of your results.   Thank you for choosing me and [facility] Dr Bryan Lemma

## 2019-12-23 NOTE — Telephone Encounter (Signed)
-----   Message from Volanda Napoleon, MD sent at 12/23/2019  2:24 PM EST ----- Calll - NO cancer in the liver!!  All they see is a blood blister!!  This is exactly what I thought it was!!!  Surgery is on.  Please make sure that his surgeon gets this report!!  Laurey Arrow

## 2019-12-24 ENCOUNTER — Encounter: Payer: Self-pay | Admitting: *Deleted

## 2019-12-24 NOTE — Progress Notes (Signed)
We were able to reschedule patient's MRI for yesterday. Results have returned, and sent to Dr Dema Severin. Patient will now proceed to surgery. Spoke to patient and answered several questions about timeline and expectation after surgery.   Patient's second iron infusion needed to be cancelled to avoid issues with his MRI. Message sent to schedulers to reschedule this appointment.   Oncology Nurse Navigator Documentation  Oncology Nurse Navigator Flowsheets 12/24/2019  Abnormal Finding Date -  Diagnosis Status -  Navigator Follow Up Date: -  Navigator Follow Up Reason: -  Navigator Location [facility]  Referral Date to RadOnc/MedOnc -  Navigator Encounter Type Diagnostic Results;Telephone  Telephone Outgoing Call;Diagnostic Results  Patient Visit Type MedOnc  Treatment Phase Pre-Tx/Tx Discussion  Barriers/Navigation Needs Coordination of Care;Education  Education Other  Interventions Education;Psycho-Social Support  Acuity Level 2-Minimal Needs (1-2 Barriers Identified)  Coordination of Care Appts  Education Method Verbal;Teach-back  Support Groups/Services Friends and Family  Time Spent with Patient 30

## 2019-12-25 ENCOUNTER — Ambulatory Visit (HOSPITAL_COMMUNITY): Payer: Medicare Other

## 2019-12-30 ENCOUNTER — Other Ambulatory Visit: Payer: Self-pay

## 2019-12-30 ENCOUNTER — Inpatient Hospital Stay: Payer: Medicare Other | Attending: Hematology & Oncology

## 2019-12-30 VITALS — BP 141/67 | HR 82 | Temp 98.2°F | Resp 17

## 2019-12-30 DIAGNOSIS — D500 Iron deficiency anemia secondary to blood loss (chronic): Secondary | ICD-10-CM | POA: Diagnosis not present

## 2019-12-30 DIAGNOSIS — C187 Malignant neoplasm of sigmoid colon: Secondary | ICD-10-CM | POA: Diagnosis present

## 2019-12-30 DIAGNOSIS — Z800 Family history of malignant neoplasm of digestive organs: Secondary | ICD-10-CM | POA: Insufficient documentation

## 2019-12-30 DIAGNOSIS — Z823 Family history of stroke: Secondary | ICD-10-CM | POA: Insufficient documentation

## 2019-12-30 DIAGNOSIS — Z8042 Family history of malignant neoplasm of prostate: Secondary | ICD-10-CM | POA: Insufficient documentation

## 2019-12-30 DIAGNOSIS — Z8249 Family history of ischemic heart disease and other diseases of the circulatory system: Secondary | ICD-10-CM | POA: Diagnosis not present

## 2019-12-30 DIAGNOSIS — Z79899 Other long term (current) drug therapy: Secondary | ICD-10-CM | POA: Diagnosis not present

## 2019-12-30 DIAGNOSIS — Z87891 Personal history of nicotine dependence: Secondary | ICD-10-CM | POA: Diagnosis not present

## 2019-12-30 DIAGNOSIS — K922 Gastrointestinal hemorrhage, unspecified: Secondary | ICD-10-CM | POA: Diagnosis not present

## 2019-12-30 MED ORDER — SODIUM CHLORIDE 0.9 % IV SOLN
510.0000 mg | Freq: Once | INTRAVENOUS | Status: AC
  Administered 2019-12-30: 510 mg via INTRAVENOUS
  Filled 2019-12-30: qty 17

## 2019-12-30 MED ORDER — SODIUM CHLORIDE 0.9 % IV SOLN
Freq: Once | INTRAVENOUS | Status: AC
  Filled 2019-12-30: qty 250

## 2019-12-30 NOTE — Patient Instructions (Signed)

## 2019-12-30 NOTE — Progress Notes (Signed)
Pt discharged in no apparent distress. Pt left ambulatory without assistance. Pt aware of discharge instructions and verbalized understanding and had no further questions.  

## 2020-01-02 ENCOUNTER — Encounter: Payer: Self-pay | Admitting: *Deleted

## 2020-01-02 NOTE — Progress Notes (Signed)
Oncology Nurse Navigator Documentation  Oncology Nurse Navigator Flowsheets 01/02/2020  Abnormal Finding Date -  Diagnosis Status -  Phase of Treatment Surgery  Expected Surgery Date 01/31/2020  Navigator Follow Up Date: 01/31/2020  Navigator Follow Up Reason: Surgery  Navigator Location [facility]  Referral Date to RadOnc/MedOnc -  Navigator Encounter Type Appt/Treatment Plan Review  Telephone -  Patient Visit Type MedOnc  Treatment Phase Pre-Tx/Tx Discussion  Barriers/Navigation Needs Coordination of Care;Education  Education -  Interventions None Required  Acuity Level 2-Minimal Needs (1-2 Barriers Identified)  Coordination of Care -  Education Method -  Support Groups/Services Friends and Family  Time Spent with Patient 15

## 2020-01-10 ENCOUNTER — Encounter: Payer: Self-pay | Admitting: Genetic Counselor

## 2020-01-13 ENCOUNTER — Encounter: Payer: Self-pay | Admitting: Genetic Counselor

## 2020-01-13 ENCOUNTER — Other Ambulatory Visit: Payer: Medicare Other

## 2020-01-13 ENCOUNTER — Inpatient Hospital Stay (HOSPITAL_BASED_OUTPATIENT_CLINIC_OR_DEPARTMENT_OTHER): Payer: Medicare Other | Admitting: Genetic Counselor

## 2020-01-13 DIAGNOSIS — Z800 Family history of malignant neoplasm of digestive organs: Secondary | ICD-10-CM

## 2020-01-13 DIAGNOSIS — Z85528 Personal history of other malignant neoplasm of kidney: Secondary | ICD-10-CM

## 2020-01-13 DIAGNOSIS — Z8042 Family history of malignant neoplasm of prostate: Secondary | ICD-10-CM

## 2020-01-13 DIAGNOSIS — C185 Malignant neoplasm of splenic flexure: Secondary | ICD-10-CM

## 2020-01-13 NOTE — Progress Notes (Signed)
REFERRING PROVIDER: Volanda Napoleon, MD [address] [place],  [place] [address]  PRIMARY PROVIDER:  Josem Kaufmann, MD  PRIMARY REASON FOR VISIT:  1. 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 Family history of colon cancer   3. Family history of prostate cancer   4. Personal history of kidney cancer      HISTORY OF PRESENT ILLNESS:  I connected with  John [name] on 01/13/2020 at 9:00 AM EDT by MyChart video conference and verified that I am speaking with the correct person using two identifiers.   Patient location: Home Provider location: [facility]  John [name], a 64 y.o. male, was seen for a [place] cancer genetics consultation at the request of Dr. Marin Olp due to a personal and family history of cancer.  John [name] presents to clinic today to discuss the possibility of a hereditary predisposition to cancer, genetic testing, and to further clarify his future cancer risks, as well as potential cancer risks for family members.   John [name] is a 64 y.o. male with a personal history of cancer.  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  In 2021, at the age of 75, John [name] was diagnosed with colon cancer.  CANCER HISTORY:  Oncology History   No history exists.      Past Medical History:  Diagnosis Date  . Anemia   . Arthritis   . Cancer of right kidney ([place])   . Colon cancer ([place]) 12/11/2019  . Family history of colon cancer   . Family history of prostate cancer   . Hypertension   . Lower GI bleed 12/11/2019  . Multiple thyroid nodules   . Personal history of kidney cancer     Past Surgical History:  Procedure Laterality Date  . CHOLECYSTECTOMY    . COLONOSCOPY  12/04/2019  . HERNIA REPAIR    . INCISION AND DRAINAGE / EXCISION THYROGLOSSAL CYST    . MENISCUS REPAIR Left   . MENISECTOMY Right   . NEPHRECTOMY RADICAL    .  THYROIDECTOMY, PARTIAL    . UPPER GASTROINTESTINAL ENDOSCOPY  12/04/2019    Social History   Socioeconomic History  . Marital status: Married    Spouse name: Not on file  . Number of children: Not on file  . Years of education: Not on file  . Highest education level: Not on file  Occupational History  . Not on file  Tobacco Use  . Smoking status: Former Smoker    Quit date: 1993    Years since quitting: 28.9  . Smokeless tobacco: Never Used  Vaping Use  . Vaping Use: Never used  Substance and Sexual Activity  . Alcohol use: Yes    Comment: occ  . Drug use: Never  . Sexual activity: Not on file  Other Topics Concern  . Not on file  Social History Narrative  . Not on file   Social Determinants of Health   Financial Resource Strain:   . Difficulty of Paying Living Expenses: Not on file  Food Insecurity:   . Worried About Charity fundraiser in the Last Year: Not on file  . Ran Out of Food in the Last Year: Not on file  Transportation Needs:   . Lack of Transportation (Medical): Not on file  . Lack of Transportation (Non-Medical): Not on file  Physical Activity:   . Days of Exercise per Week: Not on file  . Minutes of Exercise per Session: Not on file  Stress:   . Feeling of Stress : Not on file  Social Connections:   . Frequency of Communication with Friends and Family: Not on file  . Frequency of Social Gatherings with Friends and Family: Not on file  . Attends Religious Services: Not on file  . Active Member of Clubs or Organizations: Not on file  . Attends Archivist Meetings: Not on file  . Marital Status: Not on file     FAMILY HISTORY:  We obtained a detailed, 4-generation family history.  Significant diagnoses are listed below: Family History  Problem Relation Age of Onset  . Valvular heart disease Father   . Colon cancer Maternal Grandfather 90  . Prostate cancer Maternal Grandfather 90  . Stroke Maternal Grandfather   . Liver cancer  Paternal Uncle        probable mets, unknown primary  . Dementia Paternal Uncle   . Stroke Maternal Grandmother   . Stroke Paternal Grandmother   . AAA (abdominal aortic aneurysm) Paternal Grandmother   . Dementia Paternal Grandfather   . Other Paternal Uncle        Amyloidosis  . Colon polyps Neg Hx   . Esophageal cancer Neg Hx   . Rectal cancer Neg Hx   . Stomach cancer Neg Hx     The patient has two daughters who are cancer free.  He has three brothers who are cancer free.  Both parents are living.  The patient's mother is 79 and does not have cancer.  She has one sister who is living and is cancer free.  Her parents are deceased.  Her father had colon and prostate cancer diagnosed at age 66.  Her mother died of a stroke.  The patient's father is living at 7.  He has three brothers and four sisters. One brother had metastatic liver cancer.  There is no other family history of cancer.  John [name] is unaware of previous family history of genetic testing for hereditary cancer risks. Patient's maternal ancestors are of Caucasian descent, and paternal ancestors are of [place] descent. There is no reported Ashkenazi Jewish ancestry. There is no known consanguinity.   GENETIC COUNSELING ASSESSMENT: John [name] is a 64 y.o. male with a personal and family history of cancer which is somewhat suggestive of a hereditary cancer syndrome and predisposition to cancer given his young age of kidney cancer and the combination of cancer in the family. We, therefore, discussed and recommended the following at today's visit.   DISCUSSION: We discussed that 15 - 17% of kidney cancer is hereditary, with most cases associated with VHL.  About 5-7% of colon cancer is hereditary with most cases due to Lynch syndrome.  In some cases of Lynch syndrome, we can also see kidney cancer.  There are other genes that can be associated with hereditary colon cancer syndromes.  These include APC, MUTYH and others.  We discussed  that testing is beneficial for several reasons including knowing how to follow individuals after completing their treatment, i and understand if other family members could be at risk for cancer and allow them to undergo genetic testing.   We reviewed the characteristics, features and inheritance patterns of hereditary cancer syndromes. We also discussed genetic testing, including the appropriate family members to test, the process of testing, insurance coverage and turn-around-time for results. We discussed the  implications of a negative, positive, carrier and/or variant of uncertain significant result. We recommended John [name] pursue genetic testing for the multi-cancer gene panel. The Multi-Gene Panel offered by [facility] includes sequencing and/or deletion duplication testing of the following 85 genes: AIP, ALK, APC, ATM, AXIN2,BAP1,  BARD1, BLM, BMPR1A, BRCA1, BRCA2, BRIP1, CASR, CDC73, CDH1, CDK4, CDKN1B, CDKN1C, CDKN2A (p14ARF), CDKN2A (p16INK4a), CEBPA, CHEK2, CTNNA1, DICER1, DIS3L2, EGFR (c.2369C>T, p.Thr790Met variant only), EPCAM (Deletion/duplication testing only), FH, FLCN, GATA2, GPC3, GREM1 (Promoter region deletion/duplication testing only), HOXB13 (c.251G>A, p.Gly84Glu), HRAS, KIT, MAX, MEN1, MET, MITF (c.952G>A, p.Glu318Lys variant only), MLH1, MSH2, MSH3, MSH6, MUTYH, NBN, NF1, NF2, NTHL1, PALB2, PDGFRA, PHOX2B, PMS2, POLD1, POLE, POT1, PRKAR1A, PTCH1, PTEN, RAD50, RAD51C, RAD51D, RB1, RECQL4, RET, RNF43, RUNX1, SDHAF2, SDHA (sequence changes only), SDHB, SDHC, SDHD, SMAD4, SMARCA4, SMARCB1, SMARCE1, STK11, SUFU, TERC, TERT, TMEM127, TP53, TSC1, TSC2, VHL, WRN and WT1.    Based on John [name] personal and family history of cancer, he meets medical criteria for genetic testing. Despite that he meets criteria, he may still have an out of pocket cost. We discussed that if his out of pocket cost for testing is over $100, the laboratory will call and confirm whether he wants to proceed with testing.   If the out of pocket cost of testing is less than $100 he will be billed by the genetic testing laboratory.   PLAN: After considering the risks, benefits, and limitations, John [name] provided informed consent to pursue genetic testing.  A saliva kit will be sent to his out and he will be responsible to send the kit back to [facility] for analysis of the Multi-cancer panel. Results should be available within approximately 2-3 weeks' time, at which point they will be disclosed by telephone to John [name], as will any additional recommendations warranted by these results. John [name] will receive a summary of his genetic counseling visit and a copy of his results once available. This information will also be available in Epic.   Lastly, we encouraged John [name] to remain in contact with cancer genetics annually so that we can continuously update the family history and inform him of any changes in cancer genetics and testing that may be of benefit for this family.   John [name] questions were answered to his satisfaction today. Our contact information was provided should additional questions or concerns arise. Thank you for the referral and allowing [place] to share in the care of your patient.   Zacarias Krauter P. Florene [place], [place], [facility] Licensed, Insurance risk surveyor [email or web address] phone: [phone]  The patient was seen for a total of 45 minutes in face-to-face genetic counseling.  This patient was discussed with Drs. Magrinat, Lindi Adie and/or Burr Medico who agrees with the above.    _______________________________________________________________________ For Office Staff:  Number of people involved in session: 1 Was an Intern/ student involved with case: no

## 2020-01-21 NOTE — Progress Notes (Signed)
Pt. Needs orders for upcomming surgery.PAT and labs appointment on 01/22/20.Thanks.

## 2020-01-21 NOTE — Patient Instructions (Addendum)
DUE TO COVID-19 ONLY ONE VISITOR IS ALLOWED TO COME WITH YOU AND STAY IN THE WAITING ROOM ONLY DURING PRE OP AND PROCEDURE DAY OF SURGERY. THE 1 VISITOR  MAY VISIT WITH YOU AFTER SURGERY IN YOUR PRIVATE ROOM DURING VISITING HOURS ONLY!  YOU NEED TO HAVE A COVID 19 TEST ON: 01/28/20 @ 11:00 AM, THIS TEST MUST BE DONE BEFORE SURGERY,  COVID TESTING SITE [place] [address], IT IS ON THE RIGHT [address]ING OUT [address] APPROXIMATELY  2 MINUTES PAST [employer] ON THE RIGHT. ONCE YOUR COVID TEST IS COMPLETED,  PLEASE BEGIN THE QUARANTINE INSTRUCTIONS AS OUTLINED IN YOUR HANDOUT.                John [name]   Your procedure is scheduled on: 01/31/20   Report to [facility] Main  Entrance   Report to admitting at: 9:00 AM     Call this number if you have problems the morning of surgery [phone]    Remember: DRINK 2 PRESURGERY ENSURE DRINKS THE NIGHT BEFORE SURGERY AT  1000 PM AND 1 PRESURGERY DRINK THE DAY OF THE PROCEDURE 3 HOURS PRIOR TO SCHEDULED SURGERY. NO SOLIDS AFTER MIDNIGHT THE DAY PRIOR TO THE SURGERY. NOTHING BY MOUTH EXCEPT CLEAR LIQUIDS UNTIL THREE HOURS PRIOR TO SCHEDULED SURGERY( 8:00 am). PLEASE FINISH PRESURGERY ENSURE DRINK PER SURGEON ORDER 3 HOURS PRIOR TO SCHEDULED SURGERY TIME WHICH NEEDS TO BE COMPLETED AT: 8:00 am. PLEASE DRINK PLENTY CLEAR LIQUIDS.  CLEAR LIQUID DIET   Foods Allowed                                                                     Foods Excluded  Coffee and tea, regular and decaf                             liquids that you cannot  Plain Jell-O any favor except red or purple                                           see through such as: Fruit ices (not with fruit pulp)                                     milk, soups, orange juice  Iced Popsicles                                    All solid food Carbonated beverages, regular and diet                                    Cranberry, grape and apple  juices Sports drinks like Gatorade Lightly seasoned clear broth or consume(fat free) Sugar, honey syrup  Sample Menu Breakfast  Lunch                                     Supper Cranberry juice                    Beef broth                            Chicken broth Jell-O                                     Grape juice                           Apple juice Coffee or tea                        Jell-O                                      Popsicle                                                Coffee or tea                        Coffee or tea  _____________________________________________________________________  BRUSH YOUR TEETH MORNING OF SURGERY AND RINSE YOUR MOUTH OUT, NO CHEWING GUM CANDY OR MINTS.    Take these medicines the morning of surgery with A SIP OF WATER: protonix.                       You may not have any metal on your body including hair pins and              piercings  Do not wear jewelry, make-up, lotions, powders or perfumes, deodorant             Do not wear nail polish on your fingernails.  Do not shave  48 hours prior to surgery.      Do not bring valuables to the hospital. [place].  Contacts, dentures or bridgework may not be worn into surgery.  Leave suitcase in the car. After surgery it may be brought to your room.     Patients discharged the day of surgery will not be allowed to drive home. IF YOU ARE HAVING SURGERY AND GOING HOME THE SAME DAY, YOU MUST HAVE AN ADULT TO DRIVE YOU HOME AND BE WITH YOU FOR 24 HOURS. YOU MAY GO HOME BY TAXI OR UBER OR ORTHERWISE, BUT AN ADULT MUST ACCOMPANY YOU HOME AND STAY WITH YOU FOR 24 HOURS.  Name and phone number of your driver:  Special Instructions: N/A              Please read over the following fact sheets you were given: _____________________________________________________________________        [facility] - Preparing for  Surgery Before surgery, you can play an important role.  Because skin is not sterile, your skin needs to be as free of germs as possible.  You can reduce the number of germs on your skin by washing with CHG (chlorahexidine gluconate) soap before surgery.  CHG is an antiseptic cleaner which kills germs and bonds with the skin to continue killing germs even after washing. Please DO NOT use if you have an allergy to CHG or antibacterial soaps.  If your skin becomes reddened/irritated stop using the CHG and inform your nurse when you arrive at Short Stay. Do not shave (including legs and underarms) for at least 48 hours prior to the first CHG shower.  You may shave your face/neck. Please follow these instructions carefully:  1.  Shower with CHG Soap the night before surgery and the  morning of Surgery.  2.  If you choose to wash your hair, wash your hair first as usual with your  normal  shampoo.  3.  After you shampoo, rinse your hair and body thoroughly to remove the  shampoo.                           4.  Use CHG as you would any other liquid soap.  You can apply chg directly  to the skin and wash                       Gently with a scrungie or clean washcloth.  5.  Apply the CHG Soap to your body ONLY FROM THE NECK DOWN.   Do not use on face/ open                           Wound or open sores. Avoid contact with eyes, ears mouth and genitals (private parts).                       Wash face,  Genitals (private parts) with your normal soap.             6.  Wash thoroughly, paying special attention to the area where your surgery  will be performed.  7.  Thoroughly rinse your body with warm water from the neck down.  8.  DO NOT shower/wash with your normal soap after using and rinsing off  the CHG Soap.                9.  Pat yourself dry with a clean towel.            10.  Wear clean pajamas.            11.  Place clean sheets on your bed the night of your first shower and do not  sleep with pets. Day  of Surgery : Do not apply any lotions/deodorants the morning of surgery.  Please wear clean clothes to the hospital/surgery center.  FAILURE TO FOLLOW THESE INSTRUCTIONS MAY RESULT IN THE CANCELLATION OF YOUR SURGERY PATIENT SIGNATURE_________________________________  NURSE SIGNATURE__________________________________  ________________________________________________________________________

## 2020-01-22 ENCOUNTER — Ambulatory Visit: Payer: Self-pay | Admitting: Surgery

## 2020-01-22 ENCOUNTER — Other Ambulatory Visit: Payer: Self-pay

## 2020-01-22 ENCOUNTER — Encounter (HOSPITAL_COMMUNITY): Payer: Self-pay

## 2020-01-22 ENCOUNTER — Encounter (HOSPITAL_COMMUNITY)
Admission: RE | Admit: 2020-01-22 | Discharge: 2020-01-22 | Disposition: A | Discharge status: Home / Self Care | Payer: Medicare Other | Source: Ambulatory Visit | Attending: Surgery | Admitting: Surgery

## 2020-01-22 DIAGNOSIS — Z01818 Encounter for other preprocedural examination: Secondary | ICD-10-CM | POA: Insufficient documentation

## 2020-01-22 LAB — HEMOGLOBIN A1C
Hgb A1c MFr Bld: 5.2 % (ref 4.8–5.6)
Mean Plasma Glucose: 102.54 mg/dL

## 2020-01-22 LAB — COMPREHENSIVE METABOLIC PANEL
ALT: 16 U/L (ref 0–44)
AST: 16 U/L (ref 15–41)
Albumin: 4 g/dL (ref 3.5–5.0)
Alkaline Phosphatase: 77 U/L (ref 38–126)
Anion gap: 9 (ref 5–15)
BUN: 16 mg/dL (ref 8–23)
CO2: 25 mmol/L (ref 22–32)
Calcium: 9.1 mg/dL (ref 8.9–10.3)
Chloride: 103 mmol/L (ref 98–111)
Creatinine, Ser: 1.09 mg/dL (ref 0.61–1.24)
GFR, Estimated: >60 mL/min (ref >60)
Glucose, Bld: 94 mg/dL (ref 70–99)
Potassium: 4.1 mmol/L (ref 3.5–5.1)
Sodium: 137 mmol/L (ref 135–145)
Total Bilirubin: 0.8 mg/dL (ref 0.3–1.2)
Total Protein: 7.7 g/dL (ref 6.5–8.1)

## 2020-01-22 LAB — CBC WITH DIFFERENTIAL/PLATELET
Abs Immature Granulocytes: 0.04 10*3/uL (ref 0.00–0.07)
Basophils Absolute: 0.1 10*3/uL (ref 0.0–0.1)
Basophils Relative: 1 %
Eosinophils Absolute: 0.2 10*3/uL (ref 0.0–0.5)
Eosinophils Relative: 2 %
HCT: 42.5 % (ref 39.0–52.0)
Hemoglobin: 13.2 g/dL (ref 13.0–17.0)
Immature Granulocytes: 1 %
Lymphocytes Relative: 19 %
Lymphs Abs: 1.5 10*3/uL (ref 0.7–4.0)
MCH: 27 pg (ref 26.0–34.0)
MCHC: 31.1 g/dL (ref 30.0–36.0)
MCV: 87.1 fL (ref 80.0–100.0)
Monocytes Absolute: 0.5 10*3/uL (ref 0.1–1.0)
Monocytes Relative: 6 %
Neutro Abs: 5.5 10*3/uL (ref 1.7–7.7)
Neutrophils Relative %: 71 %
Platelets: 373 10*3/uL (ref 150–400)
RBC: 4.88 MIL/uL (ref 4.22–5.81)
RDW: 17.7 % — ABNORMAL HIGH (ref 11.5–15.5)
WBC: 7.8 10*3/uL (ref 4.0–10.5)
nRBC: 0 % (ref 0.0–0.2)

## 2020-01-22 LAB — PROTIME-INR
INR: 1.1 (ref 0.8–1.2)
Prothrombin Time: 13.5 seconds (ref 11.4–15.2)

## 2020-01-22 LAB — APTT: aPTT: 25 seconds (ref 24–36)

## 2020-01-22 NOTE — H&P (Signed)
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 He is found to be anemic on his lab work. His hemoglobin has fallen to the level of 10 from a normal level a couple of years ago. He has never had a colonoscopy. He was referred to gastroenterology for a colonoscopy which was completed with Dr. Bryan Lemma 12/04/19. This is notable for some hemorrhoids. A frond-like/villous ulcerated large mass was found in sigmoid colon. This was not able to be traversed with the colonoscope. This was biopsied. This was tattooed to 3 cm distal to the lesion. An additional 5 mm polyp was on the sigmoid that was sessile and removed. 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 He underwent staging CT chest/abdomen/pelvis after being seen at medical oncology, Dr. Marin Olp.  CT chest/abdomen/pelvis 12/13/19 showed a partially circumferential mass in the sigmoid measuring 4.8 x 4.4 x 4.3 cm. Multiple subcentimeter lymph nodes in the sigmoid mesocolon. Hypodense lesion in the lateral liver down was 1.5 x 1.5 cm suspicious for hepatic metastatic disease. MRI is currently pending. Multiple tiny centrilobular pulmonary nodules benign/calcified in appearance-3 mm.  He was referred to [place]. He currently is doing well. He denies any symptoms whatsoever related to this site from the iron deficiency anemia and mild fatigue. He denies any bloating, abdominal pain, cramps, constipation, diarrhea, or evident blood in the stool. He does not take any stool softeners or laxatives. He is passing gas and having a bowel movement every day to every other day. He reports that this is his baseline and has been this way his entire adult life.  PMH: HTN, GERD  PSH: Exploratory laparotomy with open left nephrectomy for kidney  cancer 1997 and [place]. Open supraumbilical incisional hernia repair with mesh, 2 years ago-[place]-Dr. Sherri Rad.  FHx: Only known family history of cancer is colon and prostate cancer in his father who is in his 58s at the time of diagnosis.  Social: Denies use of tobacco/EtOH/drugs. Previously worked in [employer]. Now does a lot of woodworking at home.  ROS: A comprehensive 10 system review of systems was completed with the patient and pertinent findings as noted above.  The patient is a 64 year old male.   Past Surgical History (Chanel Teressa Senter, [place]; 12/17/2019 8:51 AM) Colon Polyp Removal - Colonoscopy  Gallbladder Surgery - Open  Knee Surgery  Bilateral. Nephrectomy  Left. Oral Surgery  Ventral / Umbilical Hernia Surgery  Left.  Diagnostic Studies History [facility] Teressa Senter, [place]; 12/17/2019 8:51 AM) Colonoscopy  within last year  Allergies (Chanel Teressa Senter, CMA; 12/17/2019 8:51 AM) Lisinopril *ANTIHYPERTENSIVES*  Allergies Reconciled   Medication History (Chanel Teressa Senter, CMA; 12/17/2019 8:51 AM) Pantoprazole Sodium (40MG  Tablet DR, Oral) Active. Sucralfate (1GM Tablet, Oral) Active. Telmisartan (40MG  Tablet, Oral) Active. Medications Reconciled  Social History Antonietta Jewel, CMA; 12/17/2019 8:51 AM) Alcohol use  Occasional alcohol use. No drug use  Tobacco use  Former smoker.  Family History Antonietta Jewel, [place]; 12/17/2019 8:51 AM) Heart Disease  Father. Hypertension  Mother.  Other Problems (Chanel Teressa Senter, CMA; 12/17/2019 8:51 AM) Arthritis  Cancer  Cholelithiasis  Colon Cancer  Diverticulosis  Gastric Ulcer  Gastroesophageal Reflux Disease  High blood pressure  Thyroid Disease  Ventral Hernia Repair     Review of Systems (Chanel Nolan CMA; 12/17/2019 8:51 AM) General Not Present- Appetite  Loss, Chills, Fatigue, Fever, Night Sweats, Weight Gain and Weight Loss. Skin Not Present- Change in Wart/Mole, Dryness, Hives, Jaundice,  New Lesions, Non-Healing Wounds, Rash and Ulcer. HEENT Present- Hearing Loss, Ringing in the Ears and Wears glasses/contact lenses. Not Present- Earache, Hoarseness, Nose Bleed, Oral Ulcers, Seasonal Allergies, Sinus Pain, Sore Throat, Visual Disturbances and Yellow Eyes. Breast Not Present- Breast Mass, Breast Pain, Nipple Discharge and Skin Changes. Cardiovascular Not Present- Chest Pain, Difficulty Breathing Lying Down, Leg Cramps, Palpitations, Rapid Heart Rate, Shortness of Breath and Swelling of Extremities. Gastrointestinal Not Present- Abdominal Pain, Bloating, Bloody Stool, Change in Bowel Habits, Chronic diarrhea, Constipation, Difficulty Swallowing, Excessive gas, Gets full quickly at meals, Hemorrhoids, Indigestion, Nausea, Rectal Pain and Vomiting. Male Genitourinary Not Present- Blood in Urine, Change in Urinary Stream, Frequency, Impotence, Nocturia, Painful Urination, Urgency and Urine Leakage. Neurological Not Present- Decreased Memory, Fainting, Headaches, Numbness, Seizures, Tingling, Tremor, Trouble walking and Weakness. Psychiatric Not Present- Anxiety, Bipolar, Change in Sleep Pattern, Depression, Fearful and Frequent crying. Endocrine Not Present- Cold Intolerance, Excessive Hunger, Hair Changes, Heat Intolerance, Hot flashes and New Diabetes. Hematology Not Present- Blood Thinners, Easy Bruising, Excessive bleeding, Gland problems, HIV and Persistent Infections.  Vitals (Chanel Nolan CMA; 12/17/2019 8:52 AM) 12/17/2019 8:51 AM Weight: 269.13 lb Height: 77in Body Surface Area: 2.54 m Body Mass Index: 31.91 kg/m  Temp.: 97.34F  Pulse: 60 (Regular)  BP: 124/74(Sitting, Left Arm, Standard)       Physical Exam John Gave M. Lavance Beazer MD; 12/17/2019 9:35 AM) The physical exam findings are as follows: Note: Constitutional: No acute distress; conversant; no deformities; wearing mask Eyes: Moist conjunctiva; no lid lag; anicteric sclerae; pupils equal and  round Neck: Trachea midline; no palpable thyromegaly Lungs: Normal respiratory effort; no tactile fremitus CV: rrr; no palpable thrill; no pitting edema GI: Abdomen obese, soft, nontender, nondistended; no palpable hepatosplenomegaly. No palpable masses. Midline scar consistent with stated history. No palpable hernia. MSK: Normal gait; no clubbing/cyanosis Psychiatric: Appropriate affect; alert and oriented 3 Lymphatic: No palpable cervical or axillary lymphadenopathy    Assessment & Plan John Gave M. Orvil Faraone MD; 12/17/2019 9:41 AM) COLON CANCER (C18.9)  Mr. Nixon is a very pleasant 18yoM with hx HTN, GERD, kidney cancer (s/p open left nephrectomy 1997) here with asymptomatic sigmoid colon cancer -CT CAP 1.5 x 1.5 cm mass in lateral liver dome; MRI pending (notes hx of hemangioma in his liver ~82yrs ago on an MRI done in [place]); mass noted in sigmoid colon (I also reviewed); also tattoo'd -CEA <0.5  -The anatomy and physiology of the GI tract was discussed with the patient. The pathophysiology of colon cancer was discussed at length with associated pictures. -MRI abd showed liver lesion to be hemangioma and a right upper renal cyst was also noted. -MDT submission -Genetics referral -If no evidence of metastatic disease, we reviewed options going forward including surgery-robotic sigmoidectomy and scenarios where an open surgery may be necessary given his surgical history. Flexible sigmoidoscopy. Probable lysis of adhesions. -The planned procedure, material risks (including, but not limited to, pain, bleeding, infection, scarring, need for blood transfusion, damage to surrounding structures- blood vessels/nerves/viscus/organs, damage to ureter,leak from anastomosis, need for additional procedures, worsening of pre-existing medical conditions, need for stoma which may be permanent, hernia, recurrence of cancer, DVT/PE, pneumonia, heart attack, stroke, death) benefits and alternatives to  surgery were discussed at length. The patient's questions were answered to his satisfaction, he voiced understanding and elected to proceed with surgery. Additionally, we discussed typical postoperative expectations and the  recovery process.  This patient encounter took 65 minutes today to perform the following: take history, perform exam, review outside records, interpret imaging, counsel the patient on their diagnosis and document encounter, findings & plan in the EHR  Signed by Ileana Roup, MD (12/17/2019 9:42 AM)

## 2020-01-22 NOTE — Progress Notes (Addendum)
COVID Vaccine Completed: Yes Date COVID Vaccine completed: 01/14/20. [place]. COVID vaccine manufacturer:     Moderna      PCP - Dr. Pat Kocher Cardiologist -   Chest x-ray - CT chest: 12/13/19 EKG - Requested. Stress Test -  ECHO -  Cardiac Cath -  Pacemaker/ICD device last checked:  Sleep Study -  CPAP -   Fasting Blood Sugar -  Checks Blood Sugar _____ times a day  Blood Thinner Instructions: Aspirin Instructions: Last Dose:  Anesthesia review: Hx: HTN  Patient denies shortness of breath, fever, cough and chest pain at PAT appointment   Patient verbalized understanding of instructions that were given to them at the PAT appointment. Patient was also instructed that they will need to review over the PAT instructions again at home before surgery.

## 2020-01-28 ENCOUNTER — Other Ambulatory Visit (HOSPITAL_COMMUNITY)
Admission: RE | Admit: 2020-01-28 | Discharge: 2020-01-28 | Disposition: A | Discharge status: Home / Self Care | Payer: Medicare Other | Source: Ambulatory Visit | Attending: Surgery | Admitting: Surgery

## 2020-01-28 DIAGNOSIS — Z20822 Contact with and (suspected) exposure to covid-19: Secondary | ICD-10-CM | POA: Insufficient documentation

## 2020-01-28 DIAGNOSIS — Z01812 Encounter for preprocedural laboratory examination: Secondary | ICD-10-CM | POA: Insufficient documentation

## 2020-01-28 LAB — SARS CORONAVIRUS 2 (TAT 6-24 HRS): SARS Coronavirus 2: NEGATIVE

## 2020-01-30 MED ORDER — BUPIVACAINE LIPOSOME 1.3 % IJ SUSP
20.0000 mL | INTRAMUSCULAR | Status: DC
  Filled 2020-01-30: qty 20

## 2020-01-31 ENCOUNTER — Inpatient Hospital Stay (HOSPITAL_COMMUNITY): Payer: Medicare Other | Admitting: Registered Nurse

## 2020-01-31 ENCOUNTER — Encounter (HOSPITAL_COMMUNITY)
Admission: RE | Disposition: A | Discharge status: Home / Self Care | Payer: Self-pay | Source: Home / Self Care | Attending: Surgery

## 2020-01-31 ENCOUNTER — Other Ambulatory Visit: Payer: Self-pay

## 2020-01-31 ENCOUNTER — Encounter: Payer: Self-pay | Admitting: *Deleted

## 2020-01-31 ENCOUNTER — Inpatient Hospital Stay (HOSPITAL_COMMUNITY)
Admission: RE | Admit: 2020-01-31 | Discharge: 2020-02-04 | DRG: 330 | Disposition: A | Discharge status: Home / Self Care | Payer: Medicare Other | Attending: Surgery | Admitting: Surgery

## 2020-01-31 ENCOUNTER — Encounter (HOSPITAL_COMMUNITY): Payer: Self-pay | Admitting: Surgery

## 2020-01-31 DIAGNOSIS — Z823 Family history of stroke: Secondary | ICD-10-CM | POA: Diagnosis not present

## 2020-01-31 DIAGNOSIS — Z800 Family history of malignant neoplasm of digestive organs: Secondary | ICD-10-CM | POA: Diagnosis not present

## 2020-01-31 DIAGNOSIS — Z5331 Laparoscopic surgical procedure converted to open procedure: Secondary | ICD-10-CM

## 2020-01-31 DIAGNOSIS — Z8042 Family history of malignant neoplasm of prostate: Secondary | ICD-10-CM | POA: Diagnosis not present

## 2020-01-31 DIAGNOSIS — Z20822 Contact with and (suspected) exposure to covid-19: Secondary | ICD-10-CM | POA: Diagnosis present

## 2020-01-31 DIAGNOSIS — Z888 Allergy status to other drugs, medicaments and biological substances status: Secondary | ICD-10-CM | POA: Diagnosis not present

## 2020-01-31 DIAGNOSIS — D509 Iron deficiency anemia, unspecified: Secondary | ICD-10-CM | POA: Diagnosis present

## 2020-01-31 DIAGNOSIS — I10 Essential (primary) hypertension: Secondary | ICD-10-CM | POA: Diagnosis present

## 2020-01-31 DIAGNOSIS — K649 Unspecified hemorrhoids: Secondary | ICD-10-CM | POA: Diagnosis present

## 2020-01-31 DIAGNOSIS — Z87891 Personal history of nicotine dependence: Secondary | ICD-10-CM

## 2020-01-31 DIAGNOSIS — C187 Malignant neoplasm of sigmoid colon: Secondary | ICD-10-CM | POA: Diagnosis present

## 2020-01-31 DIAGNOSIS — Z8249 Family history of ischemic heart disease and other diseases of the circulatory system: Secondary | ICD-10-CM

## 2020-01-31 DIAGNOSIS — Z9049 Acquired absence of other specified parts of digestive tract: Secondary | ICD-10-CM

## 2020-01-31 DIAGNOSIS — C772 Secondary and unspecified malignant neoplasm of intra-abdominal lymph nodes: Secondary | ICD-10-CM | POA: Diagnosis present

## 2020-01-31 DIAGNOSIS — K219 Gastro-esophageal reflux disease without esophagitis: Secondary | ICD-10-CM | POA: Diagnosis present

## 2020-01-31 DIAGNOSIS — Z905 Acquired absence of kidney: Secondary | ICD-10-CM

## 2020-01-31 DIAGNOSIS — Z85528 Personal history of other malignant neoplasm of kidney: Secondary | ICD-10-CM | POA: Diagnosis not present

## 2020-01-31 HISTORY — PX: LYSIS OF ADHESION: SHX5961

## 2020-01-31 HISTORY — PX: FLEXIBLE SIGMOIDOSCOPY: SHX5431

## 2020-01-31 LAB — TYPE AND SCREEN
ABO/RH(D): O POS
Antibody Screen: NEGATIVE

## 2020-01-31 LAB — ABO/RH: ABO/RH(D): O POS

## 2020-01-31 SURGERY — COLECTOMY, SIGMOID, ROBOT-ASSISTED
Anesthesia: General | Site: Abdomen

## 2020-01-31 MED ORDER — SUGAMMADEX SODIUM 500 MG/5ML IV SOLN
INTRAVENOUS | Status: AC
  Filled 2020-01-31: qty 5

## 2020-01-31 MED ORDER — PHENYLEPHRINE HCL (PRESSORS) 10 MG/ML IV SOLN
INTRAVENOUS | Status: DC | PRN
  Administered 2020-01-31: 80 ug via INTRAVENOUS
  Administered 2020-01-31: 40 ug via INTRAVENOUS
  Administered 2020-01-31: 80 ug via INTRAVENOUS
  Administered 2020-01-31: 60 ug via INTRAVENOUS
  Administered 2020-01-31: 80 ug via INTRAVENOUS

## 2020-01-31 MED ORDER — PROPOFOL 10 MG/ML IV BOLUS
INTRAVENOUS | Status: AC
  Filled 2020-01-31: qty 20

## 2020-01-31 MED ORDER — ONDANSETRON HCL 4 MG/2ML IJ SOLN
INTRAMUSCULAR | Status: AC
  Filled 2020-01-31: qty 2

## 2020-01-31 MED ORDER — BUPIVACAINE-EPINEPHRINE (PF) 0.25% -1:200000 IJ SOLN
INTRAMUSCULAR | Status: AC
  Filled 2020-01-31: qty 30

## 2020-01-31 MED ORDER — SUCCINYLCHOLINE CHLORIDE 200 MG/10ML IV SOSY
PREFILLED_SYRINGE | INTRAVENOUS | Status: AC
  Filled 2020-01-31: qty 10

## 2020-01-31 MED ORDER — PHENYLEPHRINE HCL (PRESSORS) 10 MG/ML IV SOLN: INTRAVENOUS | Status: DC | PRN

## 2020-01-31 MED ORDER — ENSURE SURGERY PO LIQD
237.0000 mL | Freq: Two times a day (BID) | ORAL | Status: DC
  Administered 2020-02-01: 10:00:00 237 mL via ORAL

## 2020-01-31 MED ORDER — HEPARIN SODIUM (PORCINE) 5000 UNIT/ML IJ SOLN
5000.0000 [IU] | Freq: Three times a day (TID) | INTRAMUSCULAR | Status: DC
  Administered 2020-02-01 – 2020-02-04 (×10): 5000 [IU] via SUBCUTANEOUS
  Filled 2020-01-31 (×10): qty 1

## 2020-01-31 MED ORDER — ENSURE PRE-SURGERY PO LIQD
296.0000 mL | Freq: Once | ORAL | Status: DC
  Filled 2020-01-31: qty 296

## 2020-01-31 MED ORDER — SUFENTANIL CITRATE 50 MCG/ML IV SOLN
INTRAVENOUS | Status: AC
  Filled 2020-01-31: qty 1

## 2020-01-31 MED ORDER — DIPHENHYDRAMINE HCL 50 MG/ML IJ SOLN
12.5000 mg | Freq: Four times a day (QID) | INTRAMUSCULAR | Status: DC | PRN
Start: 2020-01-31 — End: 2020-02-04

## 2020-01-31 MED ORDER — 0.9 % SODIUM CHLORIDE (POUR BTL) OPTIME
TOPICAL | Status: DC | PRN
  Administered 2020-01-31: 13:00:00 2000 mL

## 2020-01-31 MED ORDER — DIPHENHYDRAMINE HCL 12.5 MG/5ML PO ELIX: 12.5000 mg | ORAL_SOLUTION | Freq: Four times a day (QID) | ORAL | Status: DC | PRN

## 2020-01-31 MED ORDER — CHLORHEXIDINE GLUCONATE 0.12 % MT SOLN
15.0000 mL | Freq: Once | OROMUCOSAL | Status: AC
  Administered 2020-01-31: 10:00:00 15 mL via OROMUCOSAL

## 2020-01-31 MED ORDER — OXYCODONE HCL 5 MG/5ML PO SOLN
5.0000 mg | Freq: Once | ORAL | Status: DC | PRN
Start: 2020-01-31 — End: 2020-01-31

## 2020-01-31 MED ORDER — BUPIVACAINE LIPOSOME 1.3 % IJ SUSP
INTRAMUSCULAR | Status: DC | PRN
  Administered 2020-01-31: 20 mL

## 2020-01-31 MED ORDER — ENSURE PRE-SURGERY PO LIQD
592.0000 mL | Freq: Once | ORAL | Status: DC
  Filled 2020-01-31: qty 592

## 2020-01-31 MED ORDER — PROPOFOL 10 MG/ML IV BOLUS
INTRAVENOUS | Status: DC | PRN
  Administered 2020-01-31: 50 mg via INTRAVENOUS
  Administered 2020-01-31: 160 mg via INTRAVENOUS

## 2020-01-31 MED ORDER — ONDANSETRON HCL 4 MG/2ML IJ SOLN
INTRAMUSCULAR | Status: DC | PRN
  Administered 2020-01-31: 4 mg via INTRAVENOUS

## 2020-01-31 MED ORDER — CHLORHEXIDINE GLUCONATE CLOTH 2 % EX PADS: 6.0000 | MEDICATED_PAD | Freq: Once | CUTANEOUS | Status: DC

## 2020-01-31 MED ORDER — ONDANSETRON HCL 4 MG PO TABS: 4.0000 mg | ORAL_TABLET | Freq: Four times a day (QID) | ORAL | Status: DC | PRN

## 2020-01-31 MED ORDER — PANTOPRAZOLE SODIUM 40 MG PO TBEC
40.0000 mg | DELAYED_RELEASE_TABLET | Freq: Two times a day (BID) | ORAL | Status: DC
  Administered 2020-01-31 – 2020-02-04 (×8): 40 mg via ORAL
  Filled 2020-01-31 (×8): qty 1

## 2020-01-31 MED ORDER — OXYCODONE HCL 5 MG PO TABS: 5.0000 mg | ORAL_TABLET | Freq: Once | ORAL | Status: DC | PRN

## 2020-01-31 MED ORDER — ACETAMINOPHEN 500 MG PO TABS
1000.0000 mg | ORAL_TABLET | ORAL | Status: AC
  Administered 2020-01-31: 10:00:00 1000 mg via ORAL
  Filled 2020-01-31: qty 2

## 2020-01-31 MED ORDER — HEPARIN SODIUM (PORCINE) 5000 UNIT/ML IJ SOLN
5000.0000 [IU] | Freq: Once | INTRAMUSCULAR | Status: AC
  Administered 2020-01-31: 10:00:00 5000 [IU] via SUBCUTANEOUS
  Filled 2020-01-31: qty 1

## 2020-01-31 MED ORDER — KETAMINE HCL 10 MG/ML IJ SOLN
INTRAMUSCULAR | Status: DC | PRN
  Administered 2020-01-31: 50 mg via INTRAVENOUS
  Administered 2020-01-31: 20 mg via INTRAVENOUS

## 2020-01-31 MED ORDER — SIMETHICONE 80 MG PO CHEW
40.0000 mg | CHEWABLE_TABLET | Freq: Four times a day (QID) | ORAL | Status: DC | PRN
  Administered 2020-01-31: 21:00:00 40 mg via ORAL
  Filled 2020-01-31: qty 1

## 2020-01-31 MED ORDER — DEXAMETHASONE SODIUM PHOSPHATE 10 MG/ML IJ SOLN
INTRAMUSCULAR | Status: DC | PRN
  Administered 2020-01-31: 9 mg via INTRAVENOUS

## 2020-01-31 MED ORDER — FENTANYL CITRATE (PF) 250 MCG/5ML IJ SOLN
INTRAMUSCULAR | Status: AC
  Filled 2020-01-31: qty 5

## 2020-01-31 MED ORDER — SUGAMMADEX SODIUM 500 MG/5ML IV SOLN
INTRAVENOUS | Status: DC | PRN
  Administered 2020-01-31: 300 mg via INTRAVENOUS

## 2020-01-31 MED ORDER — LACTATED RINGERS IV SOLN: INTRAVENOUS | Status: DC

## 2020-01-31 MED ORDER — PHENYLEPHRINE 40 MCG/ML (10ML) SYRINGE FOR IV PUSH (FOR BLOOD PRESSURE SUPPORT)
PREFILLED_SYRINGE | INTRAVENOUS | Status: AC
  Filled 2020-01-31: qty 10

## 2020-01-31 MED ORDER — ALVIMOPAN 12 MG PO CAPS
12.0000 mg | ORAL_CAPSULE | ORAL | Status: AC
  Administered 2020-01-31: 10:00:00 12 mg via ORAL
  Filled 2020-01-31: qty 1

## 2020-01-31 MED ORDER — ALBUMIN HUMAN 5 % IV SOLN: INTRAVENOUS | Status: DC | PRN

## 2020-01-31 MED ORDER — LIDOCAINE HCL (CARDIAC) PF 100 MG/5ML IV SOSY
PREFILLED_SYRINGE | INTRAVENOUS | Status: DC | PRN
  Administered 2020-01-31: 50 mg via INTRAVENOUS

## 2020-01-31 MED ORDER — OXYCODONE HCL 5 MG PO TABS
5.0000 mg | ORAL_TABLET | Freq: Four times a day (QID) | ORAL | Status: DC | PRN
  Administered 2020-01-31: 5 mg via ORAL
  Administered 2020-02-01 – 2020-02-04 (×3): 10 mg via ORAL
  Filled 2020-01-31: qty 2
  Filled 2020-01-31: qty 1
  Filled 2020-01-31 (×2): qty 2

## 2020-01-31 MED ORDER — LABETALOL HCL 5 MG/ML IV SOLN
INTRAVENOUS | Status: AC
  Filled 2020-01-31: qty 4

## 2020-01-31 MED ORDER — HYDRALAZINE HCL 20 MG/ML IJ SOLN: 10.0000 mg | INTRAMUSCULAR | Status: DC | PRN

## 2020-01-31 MED ORDER — IBUPROFEN 400 MG PO TABS
600.0000 mg | ORAL_TABLET | Freq: Four times a day (QID) | ORAL | Status: DC | PRN
  Administered 2020-02-01: 22:00:00 600 mg via ORAL
  Filled 2020-01-31: qty 1

## 2020-01-31 MED ORDER — ONDANSETRON HCL 4 MG/2ML IJ SOLN: 4.0000 mg | Freq: Once | INTRAMUSCULAR | Status: DC | PRN

## 2020-01-31 MED ORDER — BUPIVACAINE-EPINEPHRINE (PF) 0.25% -1:200000 IJ SOLN
INTRAMUSCULAR | Status: DC | PRN
  Administered 2020-01-31: 30 mL

## 2020-01-31 MED ORDER — ROCURONIUM BROMIDE 10 MG/ML (PF) SYRINGE
PREFILLED_SYRINGE | INTRAVENOUS | Status: DC | PRN
  Administered 2020-01-31: 80 mg via INTRAVENOUS
  Administered 2020-01-31 (×3): 20 mg via INTRAVENOUS

## 2020-01-31 MED ORDER — POLYETHYLENE GLYCOL 3350 17 GM/SCOOP PO POWD: 1.0000 | Freq: Once | ORAL | Status: DC

## 2020-01-31 MED ORDER — NEOMYCIN SULFATE 500 MG PO TABS
1000.0000 mg | ORAL_TABLET | ORAL | Status: DC
  Filled 2020-01-31: qty 2

## 2020-01-31 MED ORDER — MIDAZOLAM HCL 2 MG/2ML IJ SOLN
INTRAMUSCULAR | Status: AC
  Filled 2020-01-31: qty 2

## 2020-01-31 MED ORDER — ALVIMOPAN 12 MG PO CAPS
12.0000 mg | ORAL_CAPSULE | Freq: Two times a day (BID) | ORAL | Status: DC
  Administered 2020-02-01 – 2020-02-03 (×5): 12 mg via ORAL
  Filled 2020-01-31 (×5): qty 1

## 2020-01-31 MED ORDER — LIDOCAINE 20MG/ML (2%) 15 ML SYRINGE OPTIME
INTRAMUSCULAR | Status: DC | PRN
  Administered 2020-01-31: 1 mg/kg/h via INTRAVENOUS

## 2020-01-31 MED ORDER — PHENYLEPHRINE HCL (PRESSORS) 10 MG/ML IV SOLN
INTRAVENOUS | Status: AC
  Filled 2020-01-31: qty 1

## 2020-01-31 MED ORDER — PHENYLEPHRINE HCL-NACL 10-0.9 MG/250ML-% IV SOLN
INTRAVENOUS | Status: DC | PRN
  Administered 2020-01-31: 20 ug/min via INTRAVENOUS

## 2020-01-31 MED ORDER — ROCURONIUM BROMIDE 10 MG/ML (PF) SYRINGE
PREFILLED_SYRINGE | INTRAVENOUS | Status: AC
  Filled 2020-01-31: qty 10

## 2020-01-31 MED ORDER — LIDOCAINE HCL (PF) 2 % IJ SOLN
INTRAMUSCULAR | Status: AC
  Filled 2020-01-31: qty 5

## 2020-01-31 MED ORDER — HYDROMORPHONE HCL 1 MG/ML IJ SOLN: 0.5000 mg | INTRAMUSCULAR | Status: DC | PRN

## 2020-01-31 MED ORDER — IRBESARTAN 150 MG PO TABS
150.0000 mg | ORAL_TABLET | Freq: Every day | ORAL | Status: DC
  Administered 2020-02-01 – 2020-02-04 (×4): 150 mg via ORAL
  Filled 2020-01-31 (×4): qty 1

## 2020-01-31 MED ORDER — LABETALOL HCL 5 MG/ML IV SOLN
INTRAVENOUS | Status: DC | PRN
  Administered 2020-01-31: 5 mg via INTRAVENOUS

## 2020-01-31 MED ORDER — ORAL CARE MOUTH RINSE: 15.0000 mL | Freq: Once | OROMUCOSAL | Status: AC

## 2020-01-31 MED ORDER — HYDROMORPHONE HCL 1 MG/ML IJ SOLN
INTRAMUSCULAR | Status: DC | PRN
  Administered 2020-01-31 (×4): .5 mg via INTRAVENOUS

## 2020-01-31 MED ORDER — HYDROMORPHONE HCL 2 MG/ML IJ SOLN
INTRAMUSCULAR | Status: AC
  Filled 2020-01-31: qty 1

## 2020-01-31 MED ORDER — SODIUM CHLORIDE 0.9 % IV SOLN
2.0000 g | INTRAVENOUS | Status: AC
  Administered 2020-01-31: 12:00:00 2 g via INTRAVENOUS
  Filled 2020-01-31: qty 2

## 2020-01-31 MED ORDER — SUFENTANIL CITRATE 50 MCG/ML IV SOLN
INTRAVENOUS | Status: DC | PRN
  Administered 2020-01-31: 5 ug via INTRAVENOUS
  Administered 2020-01-31 (×5): 10 ug via INTRAVENOUS

## 2020-01-31 MED ORDER — LACTATED RINGERS IR SOLN
Status: DC | PRN
  Administered 2020-01-31: 1000 mL

## 2020-01-31 MED ORDER — SUCCINYLCHOLINE CHLORIDE 200 MG/10ML IV SOSY
PREFILLED_SYRINGE | INTRAVENOUS | Status: DC | PRN
  Administered 2020-01-31: 160 mg via INTRAVENOUS

## 2020-01-31 MED ORDER — ALUM & MAG HYDROXIDE-SIMETH 200-200-20 MG/5ML PO SUSP
30.0000 mL | Freq: Four times a day (QID) | ORAL | Status: DC | PRN
  Administered 2020-02-01 – 2020-02-02 (×2): 30 mL via ORAL
  Filled 2020-01-31 (×2): qty 30

## 2020-01-31 MED ORDER — ONDANSETRON HCL 4 MG/2ML IJ SOLN
4.0000 mg | Freq: Four times a day (QID) | INTRAMUSCULAR | Status: DC | PRN
  Administered 2020-02-02 (×2): 4 mg via INTRAVENOUS
  Filled 2020-01-31 (×3): qty 2

## 2020-01-31 MED ORDER — FENTANYL CITRATE (PF) 100 MCG/2ML IJ SOLN
25.0000 ug | INTRAMUSCULAR | Status: DC | PRN
  Administered 2020-01-31 (×3): 50 ug via INTRAVENOUS

## 2020-01-31 MED ORDER — ACETAMINOPHEN 500 MG PO TABS
1000.0000 mg | ORAL_TABLET | Freq: Four times a day (QID) | ORAL | Status: DC
  Administered 2020-01-31 – 2020-02-04 (×14): 1000 mg via ORAL
  Filled 2020-01-31 (×15): qty 2

## 2020-01-31 MED ORDER — BISACODYL 5 MG PO TBEC: 20.0000 mg | DELAYED_RELEASE_TABLET | Freq: Once | ORAL | Status: DC

## 2020-01-31 MED ORDER — METRONIDAZOLE 500 MG PO TABS
1000.0000 mg | ORAL_TABLET | ORAL | Status: DC
  Filled 2020-01-31: qty 2

## 2020-01-31 MED ORDER — FENTANYL CITRATE (PF) 100 MCG/2ML IJ SOLN
INTRAMUSCULAR | Status: AC
  Filled 2020-01-31: qty 4

## 2020-01-31 MED ORDER — AMISULPRIDE (ANTIEMETIC) 5 MG/2ML IV SOLN: 10.0000 mg | Freq: Once | INTRAVENOUS | Status: DC | PRN

## 2020-01-31 MED ORDER — MIDAZOLAM HCL 5 MG/5ML IJ SOLN
INTRAMUSCULAR | Status: DC | PRN
  Administered 2020-01-31: 2 mg via INTRAVENOUS
  Administered 2020-01-31: .5 mg via INTRAVENOUS

## 2020-01-31 MED ORDER — HYDROMORPHONE HCL 1 MG/ML IJ SOLN
0.5000 mg | INTRAMUSCULAR | Status: DC | PRN
Start: 2020-01-31 — End: 2020-02-04
  Administered 2020-01-31 – 2020-02-03 (×3): 0.5 mg via INTRAVENOUS
  Filled 2020-01-31 (×3): qty 0.5

## 2020-01-31 MED ORDER — DEXAMETHASONE SODIUM PHOSPHATE 10 MG/ML IJ SOLN
INTRAMUSCULAR | Status: AC
  Filled 2020-01-31: qty 1

## 2020-01-31 SURGICAL SUPPLY — 106 items
APPLIER CLIP 5 13 M/L LIGAMAX5 (MISCELLANEOUS)
APPLIER CLIP ROT 10 11.4 M/L (STAPLE)
BLADE EXTENDED COATED 6.5IN (ELECTRODE) ×8 IMPLANT
CANNULA REDUC XI 12-8 STAPL (CANNULA) ×1
CANNULA REDUC XI 12-8MM STAPL (CANNULA) ×1
CANNULA REDUCER 12-8 DVNC XI (CANNULA) ×2 IMPLANT
CELLS DAT CNTRL 66122 CELL SVR (MISCELLANEOUS) IMPLANT
CHLORAPREP W/TINT 26 (MISCELLANEOUS) ×4 IMPLANT
CLIP APPLIE 5 13 M/L LIGAMAX5 (MISCELLANEOUS) IMPLANT
CLIP APPLIE ROT 10 11.4 M/L (STAPLE) IMPLANT
CLIP VESOLOCK LG 6/CT PURPLE (CLIP) IMPLANT
CLIP VESOLOCK MED LG 6/CT (CLIP) IMPLANT
COVER SURGICAL LIGHT HANDLE (MISCELLANEOUS) ×8 IMPLANT
COVER TIP SHEARS 8 DVNC (MISCELLANEOUS) ×2 IMPLANT
COVER TIP SHEARS 8MM DA VINCI (MISCELLANEOUS) ×2
COVER WAND RF STERILE (DRAPES) IMPLANT
DECANTER SPIKE VIAL GLASS SM (MISCELLANEOUS) ×4 IMPLANT
DEVICE TROCAR PUNCTURE CLOSURE (ENDOMECHANICALS) IMPLANT
DRAIN CHANNEL 19F RND (DRAIN) ×4 IMPLANT
DRAPE ARM DVNC X/XI (DISPOSABLE) ×8 IMPLANT
DRAPE COLUMN DVNC XI (DISPOSABLE) ×2 IMPLANT
DRAPE DA VINCI XI ARM (DISPOSABLE) ×8
DRAPE DA VINCI XI COLUMN (DISPOSABLE) ×2
DRAPE SURG IRRIG POUCH 19X23 (DRAPES) ×4 IMPLANT
DRSG OPSITE POSTOP 4X10 (GAUZE/BANDAGES/DRESSINGS) ×4 IMPLANT
DRSG OPSITE POSTOP 4X6 (GAUZE/BANDAGES/DRESSINGS) IMPLANT
DRSG OPSITE POSTOP 4X8 (GAUZE/BANDAGES/DRESSINGS) IMPLANT
DRSG TEGADERM 2-3/8X2-3/4 SM (GAUZE/BANDAGES/DRESSINGS) ×4 IMPLANT
DRSG TEGADERM 4X4.75 (GAUZE/BANDAGES/DRESSINGS) ×4 IMPLANT
ELECT REM PT RETURN 15FT ADLT (MISCELLANEOUS) ×4 IMPLANT
ENDOLOOP SUT PDS II  0 18 (SUTURE)
ENDOLOOP SUT PDS II 0 18 (SUTURE) IMPLANT
EVACUATOR SILICONE 100CC (DRAIN) ×4 IMPLANT
GAUZE SPONGE 2X2 8PLY STRL LF (GAUZE/BANDAGES/DRESSINGS) ×2 IMPLANT
GAUZE SPONGE 4X4 12PLY STRL (GAUZE/BANDAGES/DRESSINGS) IMPLANT
GLOVE BIO SURGEON STRL SZ7.5 (GLOVE) ×12 IMPLANT
GLOVE INDICATOR 8.0 STRL GRN (GLOVE) ×12 IMPLANT
GOWN STRL REUS W/TWL XL LVL3 (GOWN DISPOSABLE) ×20 IMPLANT
GRASPER SUT TROCAR 14GX15 (MISCELLANEOUS) IMPLANT
HOLDER FOLEY CATH W/STRAP (MISCELLANEOUS) ×4 IMPLANT
KIT PROCEDURE DA VINCI SI (MISCELLANEOUS)
KIT PROCEDURE DVNC SI (MISCELLANEOUS) IMPLANT
KIT TURNOVER KIT A (KITS) IMPLANT
LIGASURE IMPACT 36 18CM CVD LR (INSTRUMENTS) ×4 IMPLANT
NEEDLE INSUFFLATION 14GA 120MM (NEEDLE) ×4 IMPLANT
PACK CARDIOVASCULAR III (CUSTOM PROCEDURE TRAY) ×4 IMPLANT
PACK COLON (CUSTOM PROCEDURE TRAY) ×4 IMPLANT
PAD POSITIONING PINK XL (MISCELLANEOUS) ×4 IMPLANT
PENCIL SMOKE EVACUATOR (MISCELLANEOUS) IMPLANT
PORT LAP GEL ALEXIS MED 5-9CM (MISCELLANEOUS) ×4 IMPLANT
PROTECTOR NERVE ULNAR (MISCELLANEOUS) ×8 IMPLANT
RELOAD GRN CONTOUR (ENDOMECHANICALS) ×4 IMPLANT
RELOAD STAPLER 3.5X45 BLU DVNC (STAPLE) IMPLANT
RELOAD STAPLER 4.3X45 GRN DVNC (STAPLE) IMPLANT
RETRACTOR WND ALEXIS 25 LRG (MISCELLANEOUS) ×2 IMPLANT
RTRCTR WOUND ALEXIS 18CM MED (MISCELLANEOUS)
RTRCTR WOUND ALEXIS 25CM LRG (MISCELLANEOUS) ×4
SCISSORS LAP 5X35 DISP (ENDOMECHANICALS) IMPLANT
SEAL CANN UNIV 5-8 DVNC XI (MISCELLANEOUS) ×8 IMPLANT
SEAL XI 5MM-8MM UNIVERSAL (MISCELLANEOUS) ×8
SEALER VESSEL DA VINCI XI (MISCELLANEOUS) ×2
SEALER VESSEL EXT DVNC XI (MISCELLANEOUS) ×2 IMPLANT
SET IRRIG TUBING LAPAROSCOPIC (IRRIGATION / IRRIGATOR) ×4 IMPLANT
SLEEVE ADV FIXATION 5X100MM (TROCAR) IMPLANT
SOLUTION ELECTROLUBE (MISCELLANEOUS) ×4 IMPLANT
SPONGE GAUZE 2X2 STER 10/PKG (GAUZE/BANDAGES/DRESSINGS) ×2
STAPLER CANNULA SEAL DVNC XI (STAPLE) ×2 IMPLANT
STAPLER CANNULA SEAL XI (STAPLE) ×2
STAPLER CVD CUT GN 40 RELOAD (ENDOMECHANICALS) ×4 IMPLANT
STAPLER ECHELON POWER CIR 29 (STAPLE) ×4 IMPLANT
STAPLER ECHELON POWER CIR 31 (STAPLE) IMPLANT
STAPLER RELOAD 3.5X45 BLU DVNC (STAPLE)
STAPLER RELOAD 3.5X45 BLUE (STAPLE)
STAPLER RELOAD 4.3X45 GREEN (STAPLE)
STAPLER RELOAD 4.3X45 GRN DVNC (STAPLE)
STAPLER SHEATH (SHEATH) ×2
STAPLER SHEATH ENDOWRIST DVNC (SHEATH) ×2 IMPLANT
STOPCOCK 4 WAY LG BORE MALE ST (IV SETS) ×8 IMPLANT
SURGILUBE 2OZ TUBE FLIPTOP (MISCELLANEOUS) ×4 IMPLANT
SUT MNCRL AB 4-0 PS2 18 (SUTURE) ×4 IMPLANT
SUT PDS AB 1 CT1 27 (SUTURE) IMPLANT
SUT PDS AB 1 TP1 96 (SUTURE) IMPLANT
SUT PROLENE 0 CT 2 (SUTURE) IMPLANT
SUT PROLENE 2 0 KS (SUTURE) ×4 IMPLANT
SUT PROLENE 2 0 SH DA (SUTURE) IMPLANT
SUT SILK 2 0 (SUTURE)
SUT SILK 2 0 SH CR/8 (SUTURE) IMPLANT
SUT SILK 2-0 18XBRD TIE 12 (SUTURE) IMPLANT
SUT SILK 3 0 (SUTURE) ×2
SUT SILK 3 0 SH CR/8 (SUTURE) ×4 IMPLANT
SUT SILK 3-0 18XBRD TIE 12 (SUTURE) ×2 IMPLANT
SUT V-LOC BARB 180 2/0GR6 GS22 (SUTURE)
SUT VIC AB 3-0 SH 18 (SUTURE) IMPLANT
SUT VIC AB 3-0 SH 27 (SUTURE)
SUT VIC AB 3-0 SH 27XBRD (SUTURE) IMPLANT
SUT VICRYL 0 UR6 27IN ABS (SUTURE) ×4 IMPLANT
SUTURE V-LC BRB 180 2/0GR6GS22 (SUTURE) IMPLANT
SYR 10ML LL (SYRINGE) ×4 IMPLANT
SYS LAPSCP GELPORT 120MM (MISCELLANEOUS)
SYSTEM LAPSCP GELPORT 120MM (MISCELLANEOUS) IMPLANT
TOWEL OR NON WOVEN STRL DISP B (DISPOSABLE) ×4 IMPLANT
TRAY FOLEY MTR SLVR 16FR STAT (SET/KITS/TRAYS/PACK) ×4 IMPLANT
TROCAR ADV FIXATION 5X100MM (TROCAR) ×4 IMPLANT
TUBING CONNECTING 10 (TUBING) ×6 IMPLANT
TUBING CONNECTING 10' (TUBING) ×2
TUBING INSUFFLATION 10FT LAP (TUBING) ×4 IMPLANT

## 2020-01-31 NOTE — Transfer of Care (Signed)
Immediate Anesthesia Transfer of Care Note  Patient: Joesph July  Procedure(s) Performed: ROBOTIC CONVERTED TO  OPEN  SIGMOIDECTOMY (N/A Abdomen) LYSIS OF ADHESION (N/A ) FLEXIBLE SIGMOIDOSCOPY (N/A )  Patient Location: PACU  Anesthesia Type:General  Level of Consciousness: awake, alert  and patient cooperative  Airway & Oxygen Therapy: Patient Spontanous Breathing and Patient connected to face mask oxygen  Post-op Assessment: Report given to RN, Post -op Vital signs reviewed and stable and Patient moving all extremities X 4  Post vital signs: stable  Last Vitals:  Vitals Value Taken Time  BP 149/79 01/31/20 1445  Temp 36.8 C 01/31/20 1443  Pulse 76 01/31/20 1451  Resp    SpO2 100 % 01/31/20 1451  Vitals shown include unvalidated device data.  Last Pain:  Vitals:   01/31/20 0932  TempSrc:   PainSc: 0-No pain      Patients Stated Pain Goal: 4 (15/04/13 6438)  Complications: No complications documented.

## 2020-01-31 NOTE — Anesthesia Preprocedure Evaluation (Signed)
Anesthesia Evaluation  Patient identified by MRN, date of birth, ID band Patient awake    Reviewed: Allergy & Precautions, NPO status , Patient's Chart, lab work & pertinent test results  History of Anesthesia Complications Negative for: history of anesthetic complications  Airway Mallampati: II  TM Distance: >3 FB Neck ROM: Full    Dental  (+) Teeth Intact   Pulmonary neg pulmonary ROS, former smoker,    Pulmonary exam normal        Cardiovascular hypertension, Pt. on medications Normal cardiovascular exam     Neuro/Psych negative neurological ROS  negative psych ROS   GI/Hepatic Neg liver ROS, GERD  Medicated,Sigmoid colon cancer   Endo/Other  negative endocrine ROS  Renal/GU Renal disease (h/o renal cancer s/p nephrectomy; Cr 1.09)  negative genitourinary   Musculoskeletal  (+) Arthritis ,   Abdominal   Peds  Hematology negative hematology ROS (+)   Anesthesia Other Findings   Reproductive/Obstetrics                            Anesthesia Physical Anesthesia Plan  ASA: II  Anesthesia Plan: General   Post-op Pain Management:    Induction: Intravenous  PONV Risk Score and Plan: 3 and Ondansetron, Dexamethasone, Treatment may vary due to age or medical condition and Midazolam  Airway Management Planned: Oral ETT and Video Laryngoscope Planned  Additional Equipment: None  Intra-op Plan:   Post-operative Plan: Extubation in OR  Informed Consent: I have reviewed the patients History and Physical, chart, labs and discussed the procedure including the risks, benefits and alternatives for the proposed anesthesia with the patient or authorized representative who has indicated his/her understanding and acceptance.     Dental advisory given  Plan Discussed with:   Anesthesia Plan Comments:         Anesthesia Quick Evaluation

## 2020-01-31 NOTE — Anesthesia Postprocedure Evaluation (Signed)
Anesthesia Post Note  Patient: John [name]  Procedure(s) Performed: ROBOTIC CONVERTED TO  OPEN  SIGMOIDECTOMY (N/A Abdomen) LYSIS OF ADHESION (N/A ) FLEXIBLE SIGMOIDOSCOPY (N/A )     Patient location during evaluation: PACU Anesthesia Type: General Level of consciousness: awake and alert Pain management: pain level controlled Vital Signs Assessment: post-procedure vital signs reviewed and stable Respiratory status: spontaneous breathing, nonlabored ventilation and respiratory function stable Cardiovascular status: blood pressure returned to baseline and stable Postop Assessment: no apparent nausea or vomiting Anesthetic complications: no   No complications documented.  Last Vitals:  Vitals:   01/31/20 1545 01/31/20 1618  BP: (!) 154/101 (!) 143/78  Pulse: 89 75  Resp: (!) 24 16  Temp: 36.7 C 36.4 C  SpO2: 100% 99%    Last Pain:  Vitals:   01/31/20 1618  TempSrc: Oral  PainSc: [place]

## 2020-01-31 NOTE — Anesthesia Procedure Notes (Signed)
Procedure Name: Intubation Date/Time: 01/31/2020 11:54 AM Performed by: Lissa Morales, CRNA Pre-anesthesia Checklist: Patient identified, Emergency Drugs available, Suction available and Patient being monitored Patient Re-evaluated:Patient Re-evaluated prior to induction Oxygen Delivery Method: Circle system utilized Preoxygenation: Pre-oxygenation with 100% oxygen Induction Type: IV induction Ventilation: Mask ventilation without difficulty Laryngoscope Size: 4 and Glidescope Grade View: Grade III Tube type: Oral Tube size: 8.0 mm Number of attempts: 1 Airway Equipment and Method: Stylet and Video-laryngoscopy Placement Confirmation: ETT inserted through vocal cords under direct vision,  positive ETCO2 and breath sounds checked- equal and bilateral Secured at: 25 cm Tube secured with: Tape Dental Injury: Teeth and Oropharynx as per pre-operative assessment  Difficulty Due To: Difficulty was anticipated, Difficult Airway- due to limited oral opening and Difficult Airway- due to dentition Comments: Pt stated tories on bottom of mouth tissue damaged after last intubation. Elective glidescope

## 2020-01-31 NOTE — Op Note (Addendum)
PATIENT: John [name]  64 y.o. male  Patient Care Team: Josem Kaufmann, MD as PCP - General (Family Medicine) Volanda Napoleon, MD as Medical Oncologist (Oncology) Cordelia Poche, RN as Oncology Nurse Navigator  PREOP DIAGNOSIS: Sigmoid colon cancer  POSTOP DIAGNOSIS: Same  PROCEDURE: 1. Robotic converted to open sigmoidectomy 2. Lysis of adhesions x 90 minutes 3. Flexible sigmoidoscopy 4. Bilateral transversus abdominus plane blocks  SURGEON: Sharon Mt. Amber Guthridge, MD  ASSISTANT: Leighton Ruff, MD  ANESTHESIA: General endotracheal  EBL: 100 mL Total I/O In: 1250 [I.V.:1000; IV Piggyback:250] Out: 150 [Urine:50; Blood:100]  DRAINS: None  SPECIMEN: Sigmoid colon  COUNTS: Sponge, needle and instrument counts were reported correct x2  FINDINGS: Omental adhesions to the upper half of the abdominal wall - much of these lysed to facilitate the procedure. No obvious metastatic disease on visceral parietal peritoneum or liver. Large bulky mass in the proximal sigmoid colon with relatively dense adhesions from prior open left nephrectomy. Tumor laid on left upper pelvic sidewall and lateral abdominal wall. A portion of the transversus abdominus was included with the specimen - unclear if adhesions/desmoplasia vs tumor therefore included. A 29 mm EEA colorectal anastomosis fashioned 19 cm from the anal verge by flexible sigmoidoscopy.  NARRATIVE: Informed consent was verified. He was taken to the operating room, placed supine on the operating table and SCD's were applied. General endotracheal anesthesia was induced without difficulty. The patient was then positioned in the lithotomy position with Allen stirrups.  A foley catheter was then placed by nursing under sterile conditions. Hair on the abdomen was clipped.  A foley catheter was placed under sterile conditions. Pressure points were then evaluated and padded. The abdomen was then prepped and draped in the standard sterile fashion.  Surgical timeout was called indicating the correct patient, procedure, positioning and need for preoperative antibiotics.   An OG tube was placed by anesthesia and confirmed to be to suction.  At Palmer's point, a stab incision was created and the Veress needle was introduced into the peritoneal cavity on the first attempt.  Intraperitoneal location was confirmed by the aspiration and saline drop test.  Pneumoperitoneum was established to a maximum pressure of 15 mmHg using CO2.  Following this, the abdomen was marked for planned trocar sites.  Just to the right and cephalad to the umbilicus, an 8 mm incision was created and an 8 mm blunt tipped robotic trocar was cautiously placed into the peritoneal cavity.  The laparoscope was inserted and demonstrated no evidence of trocar site nor Veress needle site complications.  The Veress needle was removed.  Bilateral transversus abdominis plane blocks were then created using a dilute mixture of Exparel with Marcaine.  3 additional 8 mm robotic trochars were placed under direct visualization roughly in a line extending from the right ASIS towards the left upper quadrant. The bladder was inspected and noted to be at/below the pubic symphysis.  An additional 5 mm assist port was placed in the right lateral abdomen under direct visualization.  The abdomen was surveyed and there were omental adhesions in the upper abdomen. These were lysed sharply to facilitate the procedure. He was positioned in Trendelenburg with some left side up.  Small bowel was carefully retracted out of the pelvis.  The robot was then docked and I went to the console. There is an obvious mass at the proximal sigmoid colon with adhesions of the colon to the left abdominal wall and upper pelvic side wall.   Attachments of  the sigmoid colon were taken down from the intersigmoid fossa.  There were fairly dense adhesions along the proximal sigmoid colon to the lateral left abdominal wall and upper left  pelvic sidewall.  It is unclear if these are desmoplastic, related to prior surgery, or malignant type adhesions but certainly appear abnormal. This encroaches on the left gonadal vessels as well and he has known hydrocele which may be explained by this finding as well. Regardless, the decision is made to proceed with resecting this tissue en bloc and leave no tumor behind.  Staying within a nice soft healthy plane lateral to this, a small portion of the transversus abdominis muscle was included.  The specimen was then approached medially rectosigmoid colon was elevated.  The mesorectal plane was identified medial to the right ureter.  The peritoneum was incised.  The avascular plane between the fascia propria and presacral fascia was gained.  He has a very large bulky mesorectum.  Working up cephalad, due to the thickness of the mesorectum and the weight of the colon, is not feasible to adequately retract it to see further medially.  We then returned to the lateral plane.  Working proximally, there were some attachments of the descending colon that were freed from the underlying retroperitoneum.  The descending colon now reached to the distal sigmoid colon.  Given the difficulty dissecting and retracting as well as the amount of scar tissue from his prior nephrectomy, the decision was made to convert to an open procedure at this point.  The robotic instruments were removed.  The robot was undocked.  I scrubbed back in.  A low midline incision was made extending from the level of the umbilicus down to 2 fingerbreadths above the pubic symphysis.  The peritoneum was entered.  An Alexis wound protector was placed and a Presenter, broadcasting was used.  The mass was again identified.  This was circumferentially cleared from its surrounding attachments.  The abdominal wall was inspected noted to be hemostatic.  The distal point of transection was then identified.  He is a fairly tall gentleman with a long rectum.  At an  area where the appendices epiploica had splayed and roughly at or just above the level of sacral promontory, the distal point of transection was identified.  A window was created in the mesorectum at this level.  A contoured green load stapler was used to divide the colon at this level.  The intervening mesentery was then divided using the LigaSure device.  The proximal point of transection was selected including a gross 5 cm proximal margin.  This is at the level of the proximal sigmoid colon.  A pursestring clamp was applied at this level after clearing the mesentery out to it.  A 2-0 Prolene on a Keith needle was passed.  The specimen was then divided and passed off with the open end proximal.  The colon this level is healthy.  There is a visible pulse in the marginal artery that required suture ligation.  A 29 mm EEA is selected.  Belt loops of 3-0 silk suture placed around the pursestring.  The anvil was placed in the pursestring was tied.  Additional descending colon and associated mesentery was carefully mobilized out of the retroperitoneum.  There was plenty of reach at this point to reach the planned anastomosis.  The pelvis was inspected and hemostasis appreciated.  The peritoneum along the medial sides of the rectum was incised to give more than adequate amount of length for the  anastomosis.  I then went below.  EEA sizers were passed up the rectum.  The 20 mm stapler was passed.  The spike was deployed just anterior to the staple line.  The components were then mated and orientation was confirmed such that there is no twisting or small bowel underneath the cut edge of the mesentery.  The stapler was then closed, held, and fired.  This was removed and the donuts were inspected and noted to be complete and intact.  The proximal donut was passed off which would technically serve as additional proximal margin.  The colon proximally anastomosis was then clamped.  The pelvis was flooded with sterile saline.   Under direct visualization the flexible sigmoidoscope was passed the anastomosis was approached and inspected.  It was hemostatic.  It is pink in color.  There is no air leak.  This is roughly 19 cm from the anal verge by flexible sigmoidoscopy. I scrubbed back in.  Irrigation was evacuated from the pelvis.  The abdomen and pelvis are surveyed and noted to be completely hemostatic without any apparent injury. All trochars are removed.  The Ocean wound protector was removed.  Gowns/gloves are changed and a fresh set of clean instruments utilized. Additional sterile drapes were placed around the field.   Sponge, needle, and instrument counts were reported correct.  The midline incision was closed with 2 running #1 looped PDS sutures tied in the middle.  He does have a small umbilical hernia defect that is exposed and had been included in her incision.  The subcutaneous tissue over the fascia was incised and good fascial edges were identified.  This is included in the closure. Additional anesthetic was infiltrated into the incision.  Sponge, needle, and instrument counts were then again reported correct. Skin incisions are closed with staples.   Honeycomb was placed over the midline and Telfa/2 x 2's over the port site incisions.  He was then taken out of lithotomy, awakened from anesthesia, extubated, and transferred to a stretcher for transport to PACU in satisfactory condition having tolerated the procedure well.

## 2020-01-31 NOTE — H&P (Signed)
CC: Here today for surgery  HPI: John [name] is a very pleasant [phone] with hx of iron deficiency anemia who is undergoing a preoperative evaluation with his primary care physician for a right knee surgery. He was found to be anemic on his lab work. His hemoglobin has fallen to the level of 10 from a normal level a couple of years ago. He has never had a colonoscopy. He was referred to gastroenterology for a colonoscopy which was completed with Dr. Bryan Lemma 12/04/19. This is notable for some hemorrhoids. A frond-like/villous ulcerated large mass was found in sigmoid colon. This was not able to be traversed with the colonoscope. This was biopsied. This was tattooed to 3 cm distal to the lesion. An additional 5 mm polyp was on the sigmoid that was sessile and removed. 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 He underwent staging CT chest/abdomen/pelvis after being seen at medical oncology, Dr. Marin Olp.  CT chest/abdomen/pelvis 12/13/19 showed a partially circumferential mass in the sigmoid measuring 4.8 x 4.4 x 4.3 cm. Multiple subcentimeter lymph nodes in the sigmoid mesocolon. Hypodense lesion in the lateral liver down was 1.5 x 1.5 cm suspicious for hepatic metastatic disease. MRI is currently pending. Multiple tiny centrilobular pulmonary nodules benign/calcified in appearance-3 mm.  He was referred to [place]. He denies any symptoms whatsoever related to this site from the iron deficiency anemia and mild fatigue. He denies any bloating, abdominal pain, cramps, constipation, diarrhea, or evident blood in the stool. He does not take any stool softeners or laxatives. He is passing gas and having a bowel movement every day to every other day. He reports that this is his baseline and has been this way his entire adult life.  He denies any changes in his health or health history since we met. Tolerated his bowel prep without issue. Stools clear at completion.  Denies any complaints today  PMH: HTN, GERD  PSH: Exploratory laparotomy with open left nephrectomy for kidney cancer 1997 and [place]. Open supraumbilical incisional hernia repair with mesh, 2 years ago-[place]-Dr. Sherri Rad.  FHx: Only known family history of cancer is colon and prostate cancer in his father who is in his 90s at the time of diagnosis.  Social: Denies use of tobacco/EtOH/drugs. Previously worked in [employer]. Now does a lot of woodworking at home.  ROS: A comprehensive 10 system review of systems was completed with the patient and pertinent findings as noted above.  Past Medical History:  Diagnosis Date  . Anemia   . Arthritis   . Cancer of right kidney ([place])    Lt. nephrectomy  . Colon cancer ([place]) 12/11/2019  . Family history of colon cancer   . Family history of prostate cancer   . Hypertension   . Lower GI bleed 12/11/2019  . Multiple thyroid nodules   . Personal history of kidney cancer     Past Surgical History:  Procedure Laterality Date  . CHOLECYSTECTOMY    . COLONOSCOPY  12/04/2019  . HERNIA REPAIR    . INCISION AND DRAINAGE / EXCISION THYROGLOSSAL CYST    . MENISCUS REPAIR Left   . MENISECTOMY Right   . NEPHRECTOMY RADICAL    . THYROIDECTOMY, PARTIAL    . UPPER GASTROINTESTINAL ENDOSCOPY  12/04/2019    Family History  Problem Relation Age of Onset  . Valvular heart disease Father   . Colon cancer Maternal Grandfather 90  . Prostate cancer Maternal Grandfather 90  . Stroke Maternal Grandfather   .  Liver cancer Paternal Uncle        probable mets, unknown primary  . Dementia Paternal Uncle   . Stroke Maternal Grandmother   . Stroke Paternal Grandmother   . AAA (abdominal aortic aneurysm) Paternal Grandmother   . Dementia Paternal Grandfather   . Other Paternal Uncle        Amyloidosis  . Colon polyps Neg Hx   . Esophageal cancer Neg Hx   . Rectal cancer Neg Hx   . Stomach cancer Neg Hx     Social:   reports that he quit smoking about 28 years ago. He has never used smokeless tobacco. He reports current alcohol use. He reports that he does not use drugs.  Allergies:  Allergies  Allergen Reactions  . Erythromycin     Upset stomach   . Lisinopril Cough  . Other Swelling    Bee Sting     Medications: I have reviewed the patient's current medications.  No results found for this or any previous visit (from the past 48 hour(s)).  No results found.  ROS - all of the below systems have been reviewed with the patient and positives are indicated with bold text General: chills, fever or night sweats Eyes: blurry vision or double vision ENT: epistaxis or sore throat Allergy/Immunology: itchy/watery eyes or nasal congestion Hematologic/Lymphatic: bleeding problems, blood clots or swollen lymph nodes Endocrine: temperature intolerance or unexpected weight changes Breast: new or changing breast lumps or nipple discharge Resp: cough, shortness of breath, or wheezing CV: chest pain or dyspnea on exertion GI: as per HPI GU: dysuria, trouble voiding, or hematuria MSK: joint pain or joint stiffness Neuro: TIA or stroke symptoms Derm: pruritus and skin lesion changes Psych: anxiety and depression  PE Blood pressure (!) 175/87, pulse 88, temperature 98.1 F (36.7 C), temperature source Oral, resp. rate 17, height '6\' 5"'  (1.956 m), weight 117.9 kg, SpO2 99 %. Constitutional: NAD; conversant; no deformities Eyes: Moist conjunctiva; no lid lag; anicteric; PERRL Neck: Trachea midline; no thyromegaly Lungs: Normal respiratory effort; no tactile fremitus CV: RRR; no palpable thrills; no pitting edema GI: Abd soft, NT/ND; midline scar consistent with history; no palpable hepatosplenomegaly MSK: Normal range of motion of extremities; no clubbing/cyanosis Psychiatric: Appropriate affect; alert and oriented x3 Lymphatic: No palpable cervical or axillary lymphadenopathy  No results found for this  or any previous visit (from the past 48 hour(s)).  No results found.   A/P: John [name] is a very pleasant [phone] with hx HTN, GERD, kidney cancer (s/p open left nephrectomy 1997) here with asymptomatic sigmoid colon cancer -CT CAP 1.5 x 1.5 cm mass in lateral liver dome; MRI pending (notes hx of hemangioma in his liver ~[place]yr ago [place]; mass noted in sigmoid colon (I also reviewed); also tattoo'd -CEA <0.5  -The anatomy and physiology of the GI tract was discussed with the patient. The pathophysiology of colon cancer was discussed at length with associated pictures. -MRI abd showed liver lesion to be hemangioma and a right upper renal cyst was also noted.  -No evident metastatic disease. We have reviewed options going forward including surgery-robotic sigmoidectomy and scenarios where an open surgery may be necessary particularly given his surgical history. Flexible sigmoidoscopy. Probable lysis of adhesions. -The planned procedure, material risks (including, but not limited to, pain, bleeding, infection, scarring, need for blood transfusion, damage to surrounding structures- blood vessels/nerves/viscus/organs, damage to ureter,leak from anastomosis, need for additional procedures, worsening of pre-existing medical conditions, need for  stoma which may be permanent, hernia, recurrence of cancer, DVT/PE, pneumonia, heart attack, stroke, death) benefits and alternatives to surgery were discussed at length. The patient's questions were answered to his satisfaction, he voiced understanding and elected to proceed with surgery. Additionally, we discussed typical postoperative expectations and the recovery process.  Sharon Mt. Dema Severin, M.D. [facility] Use [email or web address] to contact on call provider

## 2020-01-31 NOTE — Progress Notes (Signed)
Oncology Nurse Navigator Documentation  Oncology Nurse Navigator Flowsheets 01/31/2020  Abnormal Finding Date -  Diagnosis Status -  Phase of Treatment -  Expected Surgery Date -  Surgery Actual Start Date: 01/31/2020  Navigator Follow Up Date: 02/03/2020  Navigator Follow Up Reason: Pathology  Navigator Location [facility]  Referral Date to RadOnc/MedOnc -  Navigator Encounter Type Appt/Treatment Plan Review  Telephone -  Patient Visit Type MedOnc  Treatment Phase Active Tx  Barriers/Navigation Needs Coordination of Care;Education  Education -  Interventions None Required  Acuity Level 2-Minimal Needs (1-2 Barriers Identified)  Coordination of Care -  Education Method -  Support Groups/Services Friends and Family  Time Spent with Patient 15

## 2020-02-01 ENCOUNTER — Encounter (HOSPITAL_COMMUNITY): Payer: Self-pay | Admitting: Surgery

## 2020-02-01 LAB — BASIC METABOLIC PANEL
Anion gap: 11 (ref 5–15)
BUN: 11 mg/dL (ref 8–23)
CO2: 22 mmol/L (ref 22–32)
Calcium: 8.6 mg/dL — ABNORMAL LOW (ref 8.9–10.3)
Chloride: 102 mmol/L (ref 98–111)
Creatinine, Ser: 0.99 mg/dL (ref 0.61–1.24)
GFR, Estimated: >60 mL/min (ref >60)
Glucose, Bld: 124 mg/dL — ABNORMAL HIGH (ref 70–99)
Potassium: 4.5 mmol/L (ref 3.5–5.1)
Sodium: 135 mmol/L (ref 135–145)

## 2020-02-01 LAB — CBC
HCT: 36.4 % — ABNORMAL LOW (ref 39.0–52.0)
Hemoglobin: 11.2 g/dL — ABNORMAL LOW (ref 13.0–17.0)
MCH: 27 pg (ref 26.0–34.0)
MCHC: 30.8 g/dL (ref 30.0–36.0)
MCV: 87.7 fL (ref 80.0–100.0)
Platelets: 330 10*3/uL (ref 150–400)
RBC: 4.15 MIL/uL — ABNORMAL LOW (ref 4.22–5.81)
RDW: 17.2 % — ABNORMAL HIGH (ref 11.5–15.5)
WBC: 10.4 10*3/uL (ref 4.0–10.5)
nRBC: 0 % (ref 0.0–0.2)

## 2020-02-01 NOTE — Progress Notes (Signed)
1 Day Post-Op   Subjective/Chief Complaint: Patient bloated.  No flatus.  No nausea or vomiting.  Pain well controlled.   Objective: Vital signs in last 24 hours: Temp:  [97.6 F (36.4 C)-98.4 F (36.9 C)] 98.1 F (36.7 C) (12/18 1007) Pulse Rate:  [66-92] 92 (12/18 1007) Resp:  [13-24] 18 (12/18 1007) BP: (124-163)/(71-101) 163/84 (12/18 1007) SpO2:  [97 %-100 %] 98 % (12/18 1007) Last BM Date: 01/31/20  Intake/Output from previous day: 12/17 0701 - 12/18 0700 In: 3095 [P.O.:320; I.V.:2525; IV Piggyback:250] Out: 1325 [Urine:1225; Blood:100] Intake/Output this shift: Total I/O In: 250 [P.O.:250] Out: -   General appearance: alert and cooperative Resp: clear to auscultation bilaterally Cardio: regular rate and rhythm, S1, S2 normal, no murmur, click, rub or gallop Incision/Wound: Dressing clean dry intact.  No significant drainage.  Distention noted.  Nontender though.  Bowel sounds absent  Lab Results:  Recent Labs    02/01/20 0518  WBC 10.4  HGB 11.2*  HCT 36.4*  PLT 330   BMET Recent Labs    02/01/20 0518  NA 135  K 4.5  CL 102  CO2 22  GLUCOSE 124*  BUN 11  CREATININE 0.99  CALCIUM 8.6*   PT/INR No results for input(s): LABPROT, INR in the last 72 hours. ABG No results for input(s): PHART, HCO3 in the last 72 hours.  Invalid input(s): PCO2, PO2  Studies/Results: No results found.  Anti-infectives: Anti-infectives (From admission, onward)   Start     Dose/Rate Route Frequency Ordered Stop   01/31/20 1400  neomycin (MYCIFRADIN) tablet 1,000 mg  Status:  Discontinued       "And" Linked Group Details   1,000 mg Oral 3 times per day 01/31/20 0910 01/31/20 1438   01/31/20 1400  metroNIDAZOLE (FLAGYL) tablet 1,000 mg  Status:  Discontinued       "And" Linked Group Details   1,000 mg Oral 3 times per day 01/31/20 0910 01/31/20 1438   01/31/20 0915  cefoTEtan (CEFOTAN) 2 g in sodium chloride 0.9 % 100 mL IVPB        2 g 200 mL/hr over 30 Minutes  Intravenous On call to O.R. 01/31/20 0910 01/31/20 1147      Assessment/Plan: s/p Procedure(s): ROBOTIC CONVERTED TO  OPEN  SIGMOIDECTOMY (N/A) LYSIS OF ADHESION (N/A) FLEXIBLE SIGMOIDOSCOPY (N/A) Continue to encourage ambulation  Continue on clear liquids as long as he has no nausea or vomiting  Await return of bowel function  LOS: 1 day    Turner Daniels MD 02/01/2020

## 2020-02-02 LAB — BASIC METABOLIC PANEL
Anion gap: 11 (ref 5–15)
BUN: 13 mg/dL (ref 8–23)
CO2: 23 mmol/L (ref 22–32)
Calcium: 9 mg/dL (ref 8.9–10.3)
Chloride: 101 mmol/L (ref 98–111)
Creatinine, Ser: 1.14 mg/dL (ref 0.61–1.24)
GFR, Estimated: >60 mL/min (ref >60)
Glucose, Bld: 97 mg/dL (ref 70–99)
Potassium: 3.8 mmol/L (ref 3.5–5.1)
Sodium: 135 mmol/L (ref 135–145)

## 2020-02-02 LAB — CBC
HCT: 36.7 % — ABNORMAL LOW (ref 39.0–52.0)
Hemoglobin: 11.6 g/dL — ABNORMAL LOW (ref 13.0–17.0)
MCH: 27.2 pg (ref 26.0–34.0)
MCHC: 31.6 g/dL (ref 30.0–36.0)
MCV: 86.2 fL (ref 80.0–100.0)
Platelets: 313 10*3/uL (ref 150–400)
RBC: 4.26 MIL/uL (ref 4.22–5.81)
RDW: 17.5 % — ABNORMAL HIGH (ref 11.5–15.5)
WBC: 8.8 10*3/uL (ref 4.0–10.5)
nRBC: 0 % (ref 0.0–0.2)

## 2020-02-02 MED ORDER — KCL IN DEXTROSE-NACL 20-5-0.9 MEQ/L-%-% IV SOLN
INTRAVENOUS | Status: DC
  Filled 2020-02-02 (×5): qty 1000

## 2020-02-02 NOTE — Progress Notes (Signed)
2 Days Post-Op   Subjective/Chief Complaint: Patient bloated. Some  flatus.  nausea or vomiting x1 .  Pain well controlled.    Objective: Vital signs in last 24 hours: Temp:  [97.6 F (36.4 C)-98.8 F (37.1 C)] 97.6 F (36.4 C) (12/19 0513) Pulse Rate:  [76-92] 76 (12/19 0513) Resp:  [16-18] 18 (12/19 0513) BP: (135-163)/(79-86) 138/85 (12/19 0513) SpO2:  [96 %-99 %] 99 % (12/19 0513) Last BM Date: 01/31/20  Intake/Output from previous day: 12/18 0701 - 12/19 0700 In: 750 [P.O.:750] Out: -  Intake/Output this shift: Total I/O In: -  Out: 200 [Emesis/NG output:200]   General appearance: alert and cooperative Resp: clear to auscultation bilaterally Cardio: regular rate and rhythm, S1, S2 normal, no murmur, click, rub or gallop Incision/Wound: Dressing clean dry intact.  No significant drainage.  Distention noted.  Nontender though.  Lab Results:  Recent Labs    02/01/20 0518 02/02/20 0525  WBC 10.4 8.8  HGB 11.2* 11.6*  HCT 36.4* 36.7*  PLT 330 313   BMET Recent Labs    02/01/20 0518 02/02/20 0525  NA 135 135  K 4.5 3.8  CL 102 101  CO2 22 23  GLUCOSE 124* 97  BUN 11 13  CREATININE 0.99 1.14  CALCIUM 8.6* 9.0   PT/INR No results for input(s): LABPROT, INR in the last 72 hours. ABG No results for input(s): PHART, HCO3 in the last 72 hours.  Invalid input(s): PCO2, PO2  Studies/Results: No results found.  Anti-infectives: Anti-infectives (From admission, onward)   Start     Dose/Rate Route Frequency Ordered Stop   01/31/20 1400  neomycin (MYCIFRADIN) tablet 1,000 mg  Status:  Discontinued       "And" Linked Group Details   1,000 mg Oral 3 times per day 01/31/20 0910 01/31/20 1438   01/31/20 1400  metroNIDAZOLE (FLAGYL) tablet 1,000 mg  Status:  Discontinued       "And" Linked Group Details   1,000 mg Oral 3 times per day 01/31/20 0910 01/31/20 1438   01/31/20 0915  cefoTEtan (CEFOTAN) 2 g in sodium chloride 0.9 % 100 mL IVPB        2 g 200  mL/hr over 30 Minutes Intravenous On call to O.R. 01/31/20 0910 01/31/20 1147      Assessment/Plan: s/p Procedure(s): ROBOTIC CONVERTED TO  OPEN  SIGMOIDECTOMY (N/A) LYSIS OF ADHESION (N/A) FLEXIBLE SIGMOIDOSCOPY (N/A) Continue to encourage ambulation  Continue on clear liquids FOR NOW   Await return of bowel function  LOS: 2 days    Turner Daniels MD  02/02/2020

## 2020-02-03 LAB — COMPREHENSIVE METABOLIC PANEL
ALT: 15 U/L (ref 0–44)
AST: 15 U/L (ref 15–41)
Albumin: 3.3 g/dL — ABNORMAL LOW (ref 3.5–5.0)
Alkaline Phosphatase: 52 U/L (ref 38–126)
Anion gap: 11 (ref 5–15)
BUN: 10 mg/dL (ref 8–23)
CO2: 22 mmol/L (ref 22–32)
Calcium: 8.7 mg/dL — ABNORMAL LOW (ref 8.9–10.3)
Chloride: 106 mmol/L (ref 98–111)
Creatinine, Ser: 0.97 mg/dL (ref 0.61–1.24)
GFR, Estimated: >60 mL/min (ref >60)
Glucose, Bld: 114 mg/dL — ABNORMAL HIGH (ref 70–99)
Potassium: 3.9 mmol/L (ref 3.5–5.1)
Sodium: 139 mmol/L (ref 135–145)
Total Bilirubin: 0.6 mg/dL (ref 0.3–1.2)
Total Protein: 6.5 g/dL (ref 6.5–8.1)

## 2020-02-03 NOTE — Care Management Important Message (Signed)
Important Message  Patient Details IM Letter given to the Patient. Name: John [name] MRN: [id number] Date of Birth: 11/30/1955   Medicare Important Message Given:  Yes     Kerin Salen 02/03/2020, 3:33 PM

## 2020-02-03 NOTE — Progress Notes (Signed)
Subjective No acute events. Feeling much better today. Having flatus and BMs. Denies n/v today - has had liquids. Did have emesis x1 yesterday PM but then had BM and felt much better. Pain well controlled. Ambulating well on his own.  Objective: Vital signs in last 24 hours: Temp:  [98.6 F (37 C)-99.1 F (37.3 C)] 98.6 F (37 C) (12/20 0600) Pulse Rate:  [72-94] 72 (12/20 0600) Resp:  [14-18] 17 (12/20 0600) BP: (130-146)/(76-84) 130/76 (12/20 0600) SpO2:  [96 %-98 %] 98 % (12/20 0600) Last BM Date: 02/01/20  Intake/Output from previous day: 12/19 0701 - 12/20 0700 In: 2848.4 [P.O.:1010; I.V.:1838.4] Out: 200 [Emesis/NG output:200] Intake/Output this shift: No intake/output data recorded.  Gen: NAD, comfortable CV: RRR Pulm: Normal work of breathing Abd: Soft, nontender, nondistended; incisions c/d/i without erythema or drainage. Staples in place Ext: SCDs in place  Lab Results: CBC  Recent Labs    02/01/20 0518 02/02/20 0525  WBC 10.4 8.8  HGB 11.2* 11.6*  HCT 36.4* 36.7*  PLT 330 313   BMET Recent Labs    02/02/20 0525 02/03/20 0437  NA 135 139  K 3.8 3.9  CL 101 106  CO2 23 22  GLUCOSE 97 114*  BUN 13 10  CREATININE 1.14 0.97  CALCIUM 9.0 8.7*   PT/INR No results for input(s): LABPROT, INR in the last 72 hours. ABG No results for input(s): PHART, HCO3 in the last 72 hours.  Invalid input(s): PCO2, PO2  Studies/Results:  Anti-infectives: Anti-infectives (From admission, onward)   Start     Dose/Rate Route Frequency Ordered Stop   01/31/20 1400  neomycin (MYCIFRADIN) tablet 1,000 mg  Status:  Discontinued       "And" Linked Group Details   1,000 mg Oral 3 times per day 01/31/20 0910 01/31/20 1438   01/31/20 1400  metroNIDAZOLE (FLAGYL) tablet 1,000 mg  Status:  Discontinued       "And" Linked Group Details   1,000 mg Oral 3 times per day 01/31/20 0910 01/31/20 1438   01/31/20 0915  cefoTEtan (CEFOTAN) 2 g in sodium chloride 0.9 % 100 mL IVPB         2 g 200 mL/hr over 30 Minutes Intravenous On call to O.R. 01/31/20 0910 01/31/20 1147       Assessment/Plan: Patient Active Problem List   Diagnosis Date Noted  . S/P laparoscopic-assisted sigmoidectomy 01/31/2020  . Family history of colon cancer   . Family history of prostate cancer   . Personal history of kidney cancer   . Colon cancer ([place]) 12/11/2019  . Lower GI bleed 12/11/2019  . Iron deficiency anemia 10/31/2019   s/p Procedure(s): ROBOTIC CONVERTED TO  OPEN  SIGMOIDECTOMY LYSIS OF ADHESION FLEXIBLE SIGMOIDOSCOPY 01/31/2020  -Recovering well - improving appropriately -Advance to soft diet -Ambulate -Reviewed surgery again today, what we found, expectations and long-term plans with him and his wife today -PPx: SQH, SCDs  LOS: 3 days   Sharon Mt. Dema Severin, M.D. [facility] Use [email or web address] to contact on call provider

## 2020-02-04 ENCOUNTER — Encounter: Payer: Self-pay | Admitting: *Deleted

## 2020-02-04 MED ORDER — TRAMADOL HCL 50 MG PO TABS: 50.0000 mg | ORAL_TABLET | Freq: Four times a day (QID) | ORAL | 0 refills | Status: AC | PRN

## 2020-02-04 MED ORDER — GUAIFENESIN-DM 100-10 MG/5ML PO SYRP
10.0000 mL | ORAL_SOLUTION | ORAL | Status: DC | PRN
  Administered 2020-02-04: 02:00:00 10 mL via ORAL
  Filled 2020-02-04: qty 10

## 2020-02-04 NOTE — Discharge Summary (Signed)
Patient ID: John [name] MRN: [id number] DOB/AGE: 66-Dec-1957 66 y.o.  Admit date: 01/31/2020 Discharge date: 02/04/2020  Discharge Diagnoses Patient Active Problem List   Diagnosis Date Noted  . S/P laparoscopic-assisted sigmoidectomy 01/31/2020  . Family history of colon cancer   . Family history of prostate cancer   . Personal history of kidney cancer   . Colon cancer (John [name]) 12/11/2019  . Lower GI bleed 12/11/2019  . Iron deficiency anemia 10/31/2019    Procedures 01/31/20 -  PROCEDURE: 1. Robotic converted to open sigmoidectomy 2. Lysis of adhesions x 90 minutes 3. Flexible sigmoidoscopy 4. Bilateral transversus abdominus plane blocks  Hospital Course: He was admitted postoperatively and recovered appropriately. On 02/04/20 he was ambulating well on his own, tolerating soft diet without nausea/vomiting, pain well controlled, having flatus and BMs. He was comfortable with and stable for discharge home.  AF VSS NAD, comfortable and in good spirits RRR Abdomen is soft, NT/ND; incisions c/d/i without erythema or drainage - staples in place Ext - no pitting edema  Pathology returned with stage III colon cancer - lymph node positive disease. A referral to medical oncology is pending as an outpatient. Follow-up with me arranged    Allergies as of 02/04/2020      Reactions   Erythromycin    Upset stomach    Lisinopril Cough   Other Swelling   Bee Sting       Medication List    TAKE these medications   pantoprazole 40 MG tablet Commonly known as: PROTONIX Take 1 tablet (40 mg total) by mouth 2 (two) times daily.   telmisartan 40 MG tablet Commonly known as: MICARDIS Take 40 mg by mouth daily.   traMADol 50 MG tablet Commonly known as: Ultram Take 1 tablet (50 mg total) by mouth every 6 (six) hours as needed for up to 5 days (postop pain not controlled with tylenol first).         Follow-up Information    Ileana Roup, MD. Schedule an appointment  as soon as possible for a visit in 2 week(s).   Specialty: General Surgery Contact information: [place] [address] [phone]               Camilah Spillman M. Dema Severin, M.D. [facility]

## 2020-02-04 NOTE — Discharge Instructions (Signed)
POST OP INSTRUCTIONS AFTER COLON SURGERY  1. DIET: Be sure to include lots of fluids daily to stay hydrated - 64oz of water per day (8, 8 oz glasses).  Avoid fast food or heavy meals for the first couple of weeks as your are more likely to get nauseated. Avoid raw/uncooked fruits or vegetables for the first 4 weeks (its ok to have these if they are blended into smoothie form). If you have fruits/vegetables, make sure they are cooked until soft enough to mash on the roof of your mouth and chew your food well. Otherwise, diet as tolerated.  2. Take your usually prescribed home medications unless otherwise directed.  3. PAIN CONTROL: a. Pain is best controlled by a usual combination of three different methods TOGETHER: i. Ice/Heat ii. Over the counter pain medication iii. Prescription pain medication b. Most patients will experience some swelling and bruising around the surgical site.  Ice packs or heating pads (30-60 minutes up to 6 times a day) will help. Some people prefer to use ice alone, heat alone, alternating between ice & heat.  Experiment to what works for you.  Swelling and bruising can take several weeks to resolve.   c. It is helpful to take an over-the-counter pain medication regularly for the first few weeks: i. Ibuprofen (Motrin/Advil) - 200mg  tabs - take 3 tabs (600mg ) every 6 hours as needed for pain (unless you have been directed previously to avoid NSAIDs/ibuprofen) ii. Acetaminophen (Tylenol) - you may take 650mg  every 6 hours as needed. You can take this with motrin as they act differently on the body. If you are taking a narcotic pain medication that has acetaminophen in it, do not take over the counter tylenol at the same time. iii. NOTE: You may take both of these medications together - most patients  find it most helpful when alternating between the two (i.e. Ibuprofen at 6am, tylenol at 9am, ibuprofen at 12pm ...) d. A  prescription for pain medication should be given to you  upon discharge.  Take your pain medication as prescribed if your pain is not adequatly controlled with the over-the-counter pain reliefs mentioned above.  4. Avoid getting constipated.  Between the surgery and the pain medications, it is common to experience some constipation.  Increasing fluid intake and taking a fiber supplement (such as Metamucil, Citrucel, FiberCon, MiraLax, etc) 1-2 times a day regularly will usually help prevent this problem from occurring.  A mild laxative (prune juice, Milk of Magnesia, MiraLax, etc) should be taken according to package directions if there are no bowel movements after 48 hours.    5. Dressing:  You may shower normally with soap/water over wounds. Your staples will be removed at your postop follow-up appointment. Avoid lakes/pools/oceans/baths until your wounds have fully healed and the staples are out.  6. ACTIVITIES as tolerated:   a. Avoid heavy lifting (>10lbs or 1 gallon of milk) for the next 6 weeks. b. You may resume regular daily activities as tolerated--such as daily self-care, walking, climbing stairs--gradually increasing activities as tolerated.  If you can walk 30 minutes without difficulty, it is safe to try more intense activity such as jogging, treadmill, bicycling, low-impact aerobics.  c. DO NOT PUSH THROUGH PAIN.  Let pain be your guide: If it hurts to do something, don't do it. d. Dennis Bast may drive when you are no longer taking prescription pain medication, you can comfortably wear a seatbelt, and you can safely maneuver your car and apply brakes.  7. FOLLOW  UP in our office a. Please call [facility] at [phone] to set up an appointment to see your surgeon in the office for a follow-up appointment approximately 2 weeks after your surgery. b. Make sure that you call for this appointment the day you arrive home to insure a convenient appointment time.  9. If you have disability or family leave forms that need to be completed, you may have them  completed by your primary care physician's office; for return to work instructions, please ask our office staff and they will be happy to assist you in obtaining this documentation   When to call us [phone]: 1. Poor pain control 2. Reactions / problems with new medications (rash/itching, etc)  3. Fever over 101.5 F (38.5 C) 4. Inability to urinate 5. Nausea/vomiting 6. Worsening swelling or bruising 7. Continued bleeding from incision. 8. Increased pain, redness, or drainage from the incision  The clinic staff is available to answer your questions during regular business hours (8:30am-5pm).  Please don't hesitate to call and ask to speak to one of our nurses for clinical concerns.   A surgeon from [facility] is always on call at the hospitals   If you have a medical emergency, go to the nearest emergency room or call 911.  [facility], [place] [address] [place], [place], [place]  [address] MAIN: [phone] FAX: [phone] [email or web address]

## 2020-02-04 NOTE — Progress Notes (Signed)
Reviewed path with Dr Marin Olp. Follow up appointment needed in 4 weeks. Appointment scheduled and reviewed with patient.   Oncology Nurse Navigator Documentation  Oncology Nurse Navigator Flowsheets 02/04/2020  Abnormal Finding Date -  Diagnosis Status -  Phase of Treatment -  Expected Surgery Date -  Surgery Actual Start Date: -  Navigator Follow Up Date: 02/28/2020  Navigator Follow Up Reason: Follow-up Appointment  Navigator Location [facility]  Referral Date to RadOnc/MedOnc -  Navigator Encounter Type Pathology Review;Appt/Treatment Plan Review  Telephone -  Patient Visit Type MedOnc  Treatment Phase Active Tx  Barriers/Navigation Needs Coordination of Care;Education  Education Other  Interventions Coordination of Care;Education;Psycho-Social Support  Acuity Level 2-Minimal Needs (1-2 Barriers Identified)  Coordination of Care Appts  Education Method Verbal  Support Groups/Services Friends and Family  Time Spent with Patient 30

## 2020-02-05 ENCOUNTER — Telehealth: Payer: Self-pay | Admitting: Licensed Clinical Social Worker

## 2020-02-05 LAB — SURGICAL PATHOLOGY

## 2020-02-05 NOTE — Telephone Encounter (Signed)
Notified John [name] that his genetic testing sample failed in the lab. Gave him options for how to proceed, he would like to have another saliva kit mailed to him to try again. Saliva kit sent.

## 2020-02-12 ENCOUNTER — Other Ambulatory Visit: Payer: Self-pay

## 2020-02-12 NOTE — Progress Notes (Signed)
The proposed treatment discussed in conference is for discussion purposes only and is not a binding recommendation.  The patients have not been physically examined, or presented with their treatment options.  Therefore, final treatment plans cannot be decided.   

## 2020-02-25 ENCOUNTER — Encounter: Payer: Self-pay | Admitting: *Deleted

## 2020-02-27 ENCOUNTER — Other Ambulatory Visit: Payer: Self-pay | Admitting: *Deleted

## 2020-02-27 DIAGNOSIS — C185 Malignant neoplasm of splenic flexure: Secondary | ICD-10-CM

## 2020-02-28 ENCOUNTER — Telehealth: Payer: Self-pay

## 2020-02-28 ENCOUNTER — Encounter: Payer: Self-pay | Admitting: Hematology & Oncology

## 2020-02-28 ENCOUNTER — Encounter: Payer: Self-pay | Admitting: *Deleted

## 2020-02-28 ENCOUNTER — Inpatient Hospital Stay: Payer: Medicare Other | Attending: Hematology & Oncology | Admitting: Hematology & Oncology

## 2020-02-28 ENCOUNTER — Other Ambulatory Visit: Payer: Self-pay

## 2020-02-28 ENCOUNTER — Inpatient Hospital Stay: Payer: Medicare Other

## 2020-02-28 DIAGNOSIS — C187 Malignant neoplasm of sigmoid colon: Secondary | ICD-10-CM | POA: Diagnosis present

## 2020-02-28 DIAGNOSIS — C779 Secondary and unspecified malignant neoplasm of lymph node, unspecified: Secondary | ICD-10-CM | POA: Diagnosis not present

## 2020-02-28 DIAGNOSIS — C189 Malignant neoplasm of colon, unspecified: Secondary | ICD-10-CM

## 2020-02-28 DIAGNOSIS — Z5111 Encounter for antineoplastic chemotherapy: Secondary | ICD-10-CM | POA: Diagnosis present

## 2020-02-28 DIAGNOSIS — C185 Malignant neoplasm of splenic flexure: Secondary | ICD-10-CM

## 2020-02-28 DIAGNOSIS — Z79899 Other long term (current) drug therapy: Secondary | ICD-10-CM | POA: Insufficient documentation

## 2020-02-28 HISTORY — DX: Malignant neoplasm of colon, unspecified: C18.9

## 2020-02-28 LAB — COMPREHENSIVE METABOLIC PANEL
ALT: 18 U/L (ref 0–44)
AST: 14 U/L — ABNORMAL LOW (ref 15–41)
Albumin: 4.3 g/dL (ref 3.5–5.0)
Alkaline Phosphatase: 69 U/L (ref 38–126)
Anion gap: 8 (ref 5–15)
BUN: 13 mg/dL (ref 8–23)
CO2: 28 mmol/L (ref 22–32)
Calcium: 9.5 mg/dL (ref 8.9–10.3)
Chloride: 104 mmol/L (ref 98–111)
Creatinine, Ser: 1.05 mg/dL (ref 0.61–1.24)
GFR, Estimated: >60 mL/min (ref >60)
Glucose, Bld: 90 mg/dL (ref 70–99)
Potassium: 4.1 mmol/L (ref 3.5–5.1)
Sodium: 140 mmol/L (ref 135–145)
Total Bilirubin: 0.5 mg/dL (ref 0.3–1.2)
Total Protein: 6.7 g/dL (ref 6.5–8.1)

## 2020-02-28 LAB — CBC WITH DIFFERENTIAL (CANCER CENTER ONLY)
Abs Immature Granulocytes: 0.05 10*3/uL (ref 0.00–0.07)
Basophils Absolute: 0 10*3/uL (ref 0.0–0.1)
Basophils Relative: 1 %
Eosinophils Absolute: 0.1 10*3/uL (ref 0.0–0.5)
Eosinophils Relative: 1 %
HCT: 37.6 % — ABNORMAL LOW (ref 39.0–52.0)
Hemoglobin: 11.9 g/dL — ABNORMAL LOW (ref 13.0–17.0)
Immature Granulocytes: 1 %
Lymphocytes Relative: 29 %
Lymphs Abs: 2 10*3/uL (ref 0.7–4.0)
MCH: 27.3 pg (ref 26.0–34.0)
MCHC: 31.6 g/dL (ref 30.0–36.0)
MCV: 86.2 fL (ref 80.0–100.0)
Monocytes Absolute: 0.6 10*3/uL (ref 0.1–1.0)
Monocytes Relative: 9 %
Neutro Abs: 4 10*3/uL (ref 1.7–7.7)
Neutrophils Relative %: 59 %
Platelet Count: 297 10*3/uL (ref 150–400)
RBC: 4.36 MIL/uL (ref 4.22–5.81)
RDW: 16.8 % — ABNORMAL HIGH (ref 11.5–15.5)
WBC Count: 6.7 10*3/uL (ref 4.0–10.5)
nRBC: 0 % (ref 0.0–0.2)

## 2020-02-28 NOTE — Progress Notes (Signed)
Hematology and Oncology Follow Up Visit  John [name] [id number] [birth year] 65 y.o. 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  This is a second office visit.  He did undergo surgery for the colon cancer.  I think this was on 01/28/2020.  The pathology report ([id number]) showed a moderately differentiated adenocarcinoma.  It extended into the pericolonic tissue.  All margins were negative.  There was 1/21 positive lymph nodes.  There was no lymphovascular space invasion.  There is no perineural invasion.  There was normal MMR and MSI.  He recovered quite nicely.  He was in the hospital for about 4 days.  There is been no problems postop.  Overall, his stage is stage IIIb (T3N1M0).  He is eating well.  He is having no problems with bowels or bladder.  There is no bleeding.  He has had no cough or shortness of breath.  There has been no leg swelling.  He has had no rashes.  There has been no headache.  Overall, his performance status is ECOG 0.  Medications:  Current Outpatient Medications:  .  pantoprazole (PROTONIX) 40 MG tablet, Take 1 tablet (40 mg total) by mouth 2 (two) times daily., Disp: 90 tablet, Rfl: 3 .  telmisartan (MICARDIS) 40 MG tablet, Take 40 mg by mouth daily., Disp: , Rfl:   Allergies:  Allergies  Allergen Reactions  . Erythromycin     Upset stomach   . Lisinopril Cough  . Other Swelling    Bee Sting     Past Medical History, Surgical history, Social history, and Family History were reviewed and updated.  Review of Systems: Review of Systems  Constitutional: Negative.   HENT:  Negative.   Eyes: Negative.   Respiratory: Negative.   Cardiovascular: Negative.   Gastrointestinal: Negative.    Endocrine: Negative.   Genitourinary: Negative.    Musculoskeletal: Negative.   Skin: Negative.   Neurological: Negative.   Hematological: Negative.   Psychiatric/Behavioral: Negative.     Physical Exam:  weight is 262 lb (118.8 kg). His oral temperature is 98.3 F (36.8 C). His blood pressure is 148/84 (abnormal) and his pulse is 64. His respiration is 18 and oxygen saturation is 97%.   Wt Readings from Last 3 Encounters:  02/28/20 262 lb (118.8 kg)  01/31/20 260 lb (117.9 kg)  01/22/20 260 lb (117.9 kg)    Physical Exam Vitals reviewed.  HENT:     Head: Normocephalic and atraumatic.     Mouth/Throat:     Mouth: Oropharynx is clear and moist.  Eyes:     Extraocular Movements: EOM normal.     Pupils: Pupils are equal, round, and reactive to light.  Cardiovascular:     Rate and Rhythm: Normal rate and regular rhythm.     Heart sounds: Normal heart sounds.  Pulmonary:     Effort: Pulmonary effort is normal.     Breath sounds: Normal breath sounds.  Abdominal:     General: Bowel sounds are normal.     Palpations: Abdomen is soft.     Comments: Abdominal exam shows well-healed laparoscopy scars.  He has no abdominal distention.  He has good bowel sounds.  There is no fluid wave.  There is no palpable liver or  spleen tip.  Musculoskeletal:        General: No tenderness, deformity or edema. Normal range of motion.     Cervical back: Normal range of motion.  Lymphadenopathy:     Cervical: No cervical adenopathy.  Skin:    General: Skin is warm and dry.     Findings: No erythema or rash.  Neurological:     Mental Status: He is alert and oriented to person, place, and time.  Psychiatric:        Mood and Affect: Mood and affect normal.        Behavior: Behavior normal.        Thought Content: Thought content normal.        Judgment: Judgment normal.      Lab Results  Component Value Date   WBC 6.7 02/28/2020   HGB 11.9 (L) 02/28/2020   HCT 37.6 (L) 02/28/2020    MCV 86.2 02/28/2020   PLT 297 02/28/2020     Chemistry      Component Value Date/Time   NA 140 02/28/2020 1149   K 4.1 02/28/2020 1149   CL 104 02/28/2020 1149   CO2 28 02/28/2020 1149   BUN 13 02/28/2020 1149   CREATININE 1.05 02/28/2020 1149   CREATININE 1.10 12/11/2019 1143   CREATININE 1.14 12/04/2019 1310      Component Value Date/Time   CALCIUM 9.5 02/28/2020 1149   ALKPHOS 69 02/28/2020 1149   AST 14 (L) 02/28/2020 1149   AST 10 (L) 12/11/2019 1143   ALT 18 02/28/2020 1149   ALT 11 12/11/2019 1143   BILITOT 0.5 02/28/2020 1149   BILITOT 0.3 12/11/2019 1143      Impression and Plan: John [name] is a a very nice 65 year old white male.  He has stage IIIb sigmoid colon cancer.  I think he would be a good candidate for adjuvant chemotherapy.  I would recommend FOLFOX for him.  I think this would be reasonable.  I think he would benefit from 8 cycles of adjuvant FOLFOX.  He will need a Port-A-Cath.  Went over side effects with he and his wife.  He was given information sheets about the FOLFOX protocol.  I think that he should really do well.  I cannot imagine that he would have toxicity that would be significant.  We will start treatment on January 27.  I will see him back myself when he has cycle #2 of treatment in February.   Volanda Napoleon, MD 1/14/20222:11 PM

## 2020-02-28 NOTE — Progress Notes (Signed)
START ON PATHWAY REGIMEN - Colorectal ? ? ?  A cycle is every 14 days: ?    Oxaliplatin  ?    Leucovorin  ?    Fluorouracil  ?    Fluorouracil  ? ?**Always confirm dose/schedule in your pharmacy ordering system** ? ?Patient Characteristics: ?Postoperative without Neoadjuvant Therapy (Pathologic Staging), Colon, Stage III, Low Risk (pT1-3, pN1) ?Tumor Location: Colon ?Therapeutic Status: Postoperative without Neoadjuvant Therapy (Pathologic Staging) ?AJCC M Category: cM0 ?AJCC T Category: pT3 ?AJCC N Category: pN1a ?AJCC 8 Stage Grouping: IIIB ?Intent of Therapy: ?Curative Intent, Discussed with Patient ?

## 2020-02-28 NOTE — Telephone Encounter (Signed)
appts made per 02/28/20 los, pt states he would like to view on mychart    aom

## 2020-02-28 NOTE — Progress Notes (Signed)
Visited with patient prior to MD visit. Patient is feeling well after surgery. He states he is healing well and feeling closer to baseline. He has no complaints. He is ready to discuss the next step to his treatment, which per Dr Marin Olp will be chemo.   Scheduled patient for chemo education and treatment initiation. Message sent to IR schedule to get port placed.  Oncology Nurse Navigator Documentation  Oncology Nurse Navigator Flowsheets 02/28/2020  Abnormal Finding Date -  Diagnosis Status -  Phase of Treatment -  Expected Surgery Date -  Surgery Actual Start Date: -  Navigator Follow Up Date: 03/03/2020  Navigator Follow Up Reason: Chemo Class  Navigator Location [facility]  Referral Date to RadOnc/MedOnc -  Navigator Encounter Type Follow-up Appt  Telephone -  Patient Visit Type MedOnc  Treatment Phase Pre-Tx/Tx Discussion  Barriers/Navigation Needs Coordination of International aid/development worker for Upcoming Surgery/ Treatment  Interventions Coordination of Care;Education;Psycho-Social Support  Acuity Level 2-Minimal Needs (1-2 Barriers Identified)  Coordination of Care Other;Appts  Education Method Verbal  Support Groups/Services Friends and Family  Time Spent with Patient 80

## 2020-03-02 ENCOUNTER — Telehealth: Payer: Self-pay | Admitting: Genetic Counselor

## 2020-03-02 ENCOUNTER — Encounter: Payer: Self-pay | Admitting: Genetic Counselor

## 2020-03-02 ENCOUNTER — Ambulatory Visit: Payer: Self-pay | Admitting: Genetic Counselor

## 2020-03-02 ENCOUNTER — Encounter: Payer: Self-pay | Admitting: *Deleted

## 2020-03-02 ENCOUNTER — Other Ambulatory Visit: Payer: Self-pay | Admitting: *Deleted

## 2020-03-02 DIAGNOSIS — Z1379 Encounter for other screening for genetic and chromosomal anomalies: Secondary | ICD-10-CM

## 2020-03-02 DIAGNOSIS — C189 Malignant neoplasm of colon, unspecified: Secondary | ICD-10-CM

## 2020-03-02 MED ORDER — LIDOCAINE-PRILOCAINE 2.5-2.5 % EX CREA: TOPICAL_CREAM | CUTANEOUS | 3 refills | Status: DC

## 2020-03-02 MED ORDER — ONDANSETRON HCL 8 MG PO TABS: 8.0000 mg | ORAL_TABLET | Freq: Two times a day (BID) | ORAL | 1 refills | Status: DC | PRN

## 2020-03-02 MED ORDER — DEXAMETHASONE 4 MG PO TABS: 8.0000 mg | ORAL_TABLET | Freq: Every day | ORAL | 1 refills | Status: DC

## 2020-03-02 MED ORDER — PROCHLORPERAZINE MALEATE 10 MG PO TABS: 10.0000 mg | ORAL_TABLET | Freq: Four times a day (QID) | ORAL | 1 refills | Status: DC | PRN

## 2020-03-02 NOTE — Progress Notes (Signed)
Patient needs port placed prior to initiation of treatment on 03/10/20. Called and left message with Juliann Pulse in IR. She will look at schedule and try to get patient scheduled before date.   Port scheduled for 03/05/2020.   Oncology Nurse Navigator Documentation  Oncology Nurse Navigator Flowsheets 03/02/2020  Abnormal Finding Date -  Diagnosis Status -  Phase of Treatment -  Expected Surgery Date -  Surgery Actual Start Date: -  Navigator Follow Up Date: 03/03/2020  Navigator Follow Up Reason: Chemo Class  Navigator Location [facility]  Referral Date to RadOnc/MedOnc -  Navigator Encounter Type Appt/Treatment Plan Review  Telephone -  Patient Visit Type MedOnc  Treatment Phase Pre-Tx/Tx Discussion  Barriers/Navigation Needs Coordination of Care;Education  Education -  Interventions Coordination of Care  Acuity Level 2-Minimal Needs (1-2 Barriers Identified)  Coordination of Care Radiology  Education Method -  Support Groups/Services Friends and Family  Time Spent with Patient -

## 2020-03-02 NOTE — Progress Notes (Signed)
HPI:  [name] [name] was previously seen in the [facility] due to a personal and family history of colon cancer and concerns regarding a hereditary predisposition to cancer. Please refer to our prior cancer genetics clinic note for more information regarding our discussion, assessment and recommendations, at the time. [name] [name] recent genetic test results were disclosed to him, as were recommendations warranted by these results. These results and recommendations are discussed in more detail below.  CANCER HISTORY:  Oncology History   No history exists.    FAMILY HISTORY:  We obtained a detailed, 4-generation family history.  Significant diagnoses are listed below: Family History  Problem Relation Age of Onset  . Valvular heart disease Father   . Colon cancer Maternal Grandfather 90  . Prostate cancer Maternal Grandfather 90  . Stroke Maternal Grandfather   . Liver cancer Paternal Uncle        probable mets, unknown primary  . Dementia Paternal Uncle   . Stroke Maternal Grandmother   . Stroke Paternal Grandmother   . AAA (abdominal aortic aneurysm) Paternal Grandmother   . Dementia Paternal Grandfather   . Other Paternal Uncle        Amyloidosis  . Colon polyps Neg Hx   . Esophageal cancer Neg Hx   . Rectal cancer Neg Hx   . Stomach cancer Neg Hx     The patient has two daughters who are cancer free.  He has three brothers who are cancer free.  Both parents are living.  The patient's mother is 61 and does not have cancer.  She has one sister who is living and is cancer free.  Her parents are deceased.  Her father had colon and prostate cancer diagnosed at age 30.  Her mother died of a stroke.  The patient's father is living at 64.  He has three brothers and four sisters. One brother had metastatic liver cancer.  There is no other family history of cancer.  [name] [name] is unaware of previous family history of genetic testing for hereditary cancer risks.  Patient's maternal ancestors are of Caucasian descent, and paternal ancestors are of [place] descent. There is no reported Ashkenazi Jewish ancestry. There is no known consanguinity.     GENETIC TEST RESULTS: Genetic testing reported out on March 01, 2020 through the CancerNext-Expanded+RNAinsight cancer panel found no pathogenic mutations. The CancerNext-Expanded gene panel offered by [facility] and includes sequencing and rearrangement analysis for the following 77 genes: AIP, ALK, APC*, ATM*, AXIN2, BAP1, BARD1, BLM, BMPR1A, BRCA1*, BRCA2*, BRIP1*, CDC73, CDH1*, CDK4, CDKN1B, CDKN2A, CHEK2*, CTNNA1, DICER1, FANCC, FH, FLCN, GALNT12, KIF1B, LZTR1, MAX, MEN1, MET, MLH1*, MSH2*, MSH3, MSH6*, MUTYH*, NBN, NF1*, NF2, NTHL1, PALB2*, PHOX2B, PMS2*, POT1, PRKAR1A, PTCH1, PTEN*, RAD51C*, RAD51D*, RB1, RECQL, RET, SDHA, SDHAF2, SDHB, SDHC, SDHD, SMAD4, SMARCA4, SMARCB1, SMARCE1, STK11, SUFU, TMEM127, TP53*, TSC1, TSC2, VHL and XRCC2 (sequencing and deletion/duplication); EGFR, EGLN1, HOXB13, KIT, MITF, PDGFRA, POLD1, and POLE (sequencing only); EPCAM and GREM1 (deletion/duplication only). DNA and RNA analyses performed for * genes. The test report has been scanned into EPIC and is located under the Molecular Pathology section of the Results Review tab.  A portion of the result report is included below for reference.     We discussed with John [name] [name] that because current genetic testing is not perfect, it is possible there may be a gene mutation in one of these genes that current testing cannot detect, but that chance is small.  We also discussed, that  there could be another gene that has not yet been discovered, or that we have not yet tested, that is responsible for the cancer diagnoses in the family. It is also possible there is a hereditary cause for the cancer in the family that [name] [name] did not inherit and therefore was not identified in his testing.  Therefore, it is important to remain in touch with  cancer genetics in the future so that we can continue to offer [name] [name] the most up to date genetic testing.   ADDITIONAL GENETIC TESTING: We discussed with [name] [name] that his genetic testing was fairly extensive.  If there are genes identified to increase cancer risk that can be analyzed in the future, we would be happy to discuss and coordinate this testing at that time.    CANCER SCREENING RECOMMENDATIONS: [name] [name] test result is considered negative (normal).  This means that we have not identified a hereditary cause for his personal and family history of colon cancer at this time. Most cancers happen by chance and this negative test suggests that his cancer may fall into this category.    While reassuring, this does not definitively rule out a hereditary predisposition to cancer. It is still possible that there could be genetic mutations that are undetectable by current technology. There could be genetic mutations in genes that have not been tested or identified to increase cancer risk.  Therefore, it is recommended he continue to follow the cancer management and screening guidelines provided by his oncology and primary healthcare provider.   An individual's cancer risk and medical management are not determined by genetic test results alone. Overall cancer risk assessment incorporates additional factors, including personal medical history, family history, and any available genetic information that may result in a personalized plan for cancer prevention and surveillance  RECOMMENDATIONS FOR FAMILY MEMBERS:  Individuals in this family might be at some increased risk of developing cancer, over the general population risk, simply due to the family history of cancer.  We recommended women in this family have a yearly mammogram beginning at age 50, or 41 years younger than the earliest onset of cancer, an annual clinical breast exam, and perform monthly breast self-exams. Women in this family should also  have a gynecological exam as recommended by their primary provider. All family members should be referred for colonoscopy starting at age 57.  FOLLOW-UP: Lastly, we discussed with [name] [name] that cancer genetics is a rapidly advancing field and it is possible that new genetic tests will be appropriate for him and/or his family members in the future. We encouraged him to remain in contact with cancer genetics on an annual basis so we can update his personal and family histories and let him know of advances in cancer genetics that may benefit this family.   Our contact number was provided. [name] [name] questions were answered to his satisfaction, and he knows he is welcome to call us at anytime with additional questions or concerns.   Roma Kayser, [place], [facility] Licensed, Certified Genetic Counselor [email or web address]

## 2020-03-02 NOTE — Telephone Encounter (Signed)
Revealed negative genetic testing.  Discussed that we do not know why he has colon cancer or why there is cancer in the family. It could be due to a different gene that we are not testing, or maybe our current technology may not be able to pick something up.  It will be important for him to keep in contact with genetics to keep up with whether additional testing may be needed. ° ° °

## 2020-03-03 ENCOUNTER — Other Ambulatory Visit: Payer: Self-pay | Admitting: Radiology

## 2020-03-03 ENCOUNTER — Other Ambulatory Visit: Payer: Self-pay

## 2020-03-03 ENCOUNTER — Encounter: Payer: Self-pay | Admitting: *Deleted

## 2020-03-03 ENCOUNTER — Inpatient Hospital Stay: Payer: Medicare Other

## 2020-03-03 NOTE — Progress Notes (Unsigned)
Patient in chemotherapy education class with  Wife Vickie.  Telephone session done since patient is snowed in [place].  Wife Vickie on speakerphone.  Discussed side effects of  5FU, Oxaliplatin, Leucovorin  which include but are not limited to myelosuppression, decreased appetite, fatigue, fever, allergic or infusional reaction, mucositis, cardiac toxicity, cough, SOB, altered taste, nausea and vomiting, diarrhea, constipation, elevated LFTs myalgia and arthralgias, hair loss or thinning, rash, skin dryness, nail changes, peripheral neuropathy, discolored urine, delayed wound healing, mental changes (Chemo brain), increased risk of infections, weight loss.  Reviewed infusion room and office policy and procedure and phone numbers 24 hours x 7 days a week.  Reviewed ambulatory pump specifics and how to manage safe handling at home.  Reviewed when to call the office with any concerns or problems.  Scientist, clinical (histocompatibility and immunogenetics) given.  Discussed portacath insertion and EMLA cream administration.  Antiemetic protocol and chemotherapy schedule reviewed. Patient verbalized understanding of chemotherapy indications and possible side effects.  Teachback done

## 2020-03-03 NOTE — Progress Notes (Signed)
Pharmacist Chemotherapy Monitoring - Initial Assessment    Anticipated start date: 03/11/19  Regimen:  . Are orders appropriate based on the patient's diagnosis, regimen, and cycle? Yes . Does the plan date match the patient's scheduled date? Yes . Is the sequencing of drugs appropriate? Yes . Are the premedications appropriate for the patient's regimen? Yes . Prior Authorization for treatment is: Approved o If applicable, is the correct biosimilar selected based on the patient's insurance? not applicable  Organ Function and Labs: Marland Kitchen Are dose adjustments needed based on the patient's renal function, hepatic function, or hematologic function? No . Are appropriate labs ordered prior to the start of patient's treatment? Yes . Other organ system assessment, if indicated: N/A . The following baseline labs, if indicated, have been ordered: N/A  Dose Assessment: . Are the drug doses appropriate? Yes . Are the following correct: o Drug concentrations Yes o IV fluid compatible with drug Yes o Administration routes Yes o Timing of therapy Yes . If applicable, does the patient have documented access for treatment and/or plans for port-a-cath placement? yes . If applicable, have lifetime cumulative doses been properly documented and assessed? not applicable Lifetime Dose Tracking  No doses have been documented on this patient for the following tracked chemicals: Doxorubicin, Epirubicin, Idarubicin, Daunorubicin, Mitoxantrone, Bleomycin, Oxaliplatin, Carboplatin, Liposomal Doxorubicin  o   Toxicity Monitoring/Prevention: . The patient has the following take home antiemetics prescribed: Ondansetron, Prochlorperazine and Dexamethasone . The patient has the following take home medications prescribed: N/A . Medication allergies and previous infusion related reactions, if applicable, have been reviewed and addressed. Yes . The patient's current medication list has been assessed for drug-drug  interactions with their chemotherapy regimen. no significant drug-drug interactions were identified on review.  Order Review: . Are the treatment plan orders signed? Yes . Is the patient scheduled to see a provider prior to their treatment? No  I verify that I have reviewed each item in the above checklist and answered each question accordingly.  Savaya Hakes, Jacqlyn Larsen 03/03/2020 3:27 PM

## 2020-03-04 ENCOUNTER — Other Ambulatory Visit: Payer: Self-pay | Admitting: Radiology

## 2020-03-05 ENCOUNTER — Encounter (HOSPITAL_COMMUNITY): Payer: Self-pay

## 2020-03-05 ENCOUNTER — Ambulatory Visit (HOSPITAL_COMMUNITY)
Admission: RE | Admit: 2020-03-05 | Discharge: 2020-03-05 | Disposition: A | Discharge status: Home / Self Care | Payer: Medicare Other | Source: Ambulatory Visit | Attending: Hematology & Oncology | Admitting: Hematology & Oncology

## 2020-03-05 ENCOUNTER — Other Ambulatory Visit: Payer: Self-pay | Admitting: Hematology & Oncology

## 2020-03-05 ENCOUNTER — Other Ambulatory Visit: Payer: Self-pay

## 2020-03-05 DIAGNOSIS — Z79899 Other long term (current) drug therapy: Secondary | ICD-10-CM | POA: Insufficient documentation

## 2020-03-05 DIAGNOSIS — Z87891 Personal history of nicotine dependence: Secondary | ICD-10-CM | POA: Insufficient documentation

## 2020-03-05 DIAGNOSIS — I10 Essential (primary) hypertension: Secondary | ICD-10-CM | POA: Insufficient documentation

## 2020-03-05 DIAGNOSIS — C189 Malignant neoplasm of colon, unspecified: Secondary | ICD-10-CM

## 2020-03-05 HISTORY — PX: IR IMAGING GUIDED PORT INSERTION: IMG5740

## 2020-03-05 LAB — CBC WITH DIFFERENTIAL/PLATELET
Abs Immature Granulocytes: 0.03 10*3/uL (ref 0.00–0.07)
Basophils Absolute: 0.1 10*3/uL (ref 0.0–0.1)
Basophils Relative: 1 %
Eosinophils Absolute: 0.1 10*3/uL (ref 0.0–0.5)
Eosinophils Relative: 1 %
HCT: 39.1 % (ref 39.0–52.0)
Hemoglobin: 12.3 g/dL — ABNORMAL LOW (ref 13.0–17.0)
Immature Granulocytes: 0 %
Lymphocytes Relative: 30 %
Lymphs Abs: 2.2 10*3/uL (ref 0.7–4.0)
MCH: 27.2 pg (ref 26.0–34.0)
MCHC: 31.5 g/dL (ref 30.0–36.0)
MCV: 86.3 fL (ref 80.0–100.0)
Monocytes Absolute: 0.5 10*3/uL (ref 0.1–1.0)
Monocytes Relative: 7 %
Neutro Abs: 4.6 10*3/uL (ref 1.7–7.7)
Neutrophils Relative %: 61 %
Platelets: 295 10*3/uL (ref 150–400)
RBC: 4.53 MIL/uL (ref 4.22–5.81)
RDW: 16.9 % — ABNORMAL HIGH (ref 11.5–15.5)
WBC: 7.5 10*3/uL (ref 4.0–10.5)
nRBC: 0 % (ref 0.0–0.2)

## 2020-03-05 MED ORDER — MIDAZOLAM HCL 2 MG/2ML IJ SOLN
INTRAMUSCULAR | Status: AC
  Filled 2020-03-05: qty 4

## 2020-03-05 MED ORDER — FENTANYL CITRATE (PF) 100 MCG/2ML IJ SOLN
INTRAMUSCULAR | Status: AC
  Filled 2020-03-05: qty 2

## 2020-03-05 MED ORDER — HEPARIN SOD (PORK) LOCK FLUSH 100 UNIT/ML IV SOLN
INTRAVENOUS | Status: AC | PRN
  Administered 2020-03-05: 500 [IU] via INTRAVENOUS

## 2020-03-05 MED ORDER — SODIUM CHLORIDE 0.9 % IV SOLN: INTRAVENOUS | Status: DC

## 2020-03-05 MED ORDER — MIDAZOLAM HCL 2 MG/2ML IJ SOLN
INTRAMUSCULAR | Status: AC | PRN
  Administered 2020-03-05 (×4): 1 mg via INTRAVENOUS

## 2020-03-05 MED ORDER — FENTANYL CITRATE (PF) 100 MCG/2ML IJ SOLN
INTRAMUSCULAR | Status: AC | PRN
  Administered 2020-03-05 (×2): 50 ug via INTRAVENOUS

## 2020-03-05 MED ORDER — HEPARIN SOD (PORK) LOCK FLUSH 100 UNIT/ML IV SOLN
INTRAVENOUS | Status: AC
  Filled 2020-03-05: qty 5

## 2020-03-05 MED ORDER — LIDOCAINE-EPINEPHRINE 1 %-1:100000 IJ SOLN
INTRAMUSCULAR | Status: AC | PRN
  Administered 2020-03-05 (×2): 10 mL via INTRADERMAL

## 2020-03-05 MED ORDER — CEFAZOLIN SODIUM-DEXTROSE 2-4 GM/100ML-% IV SOLN: 2.0000 g | INTRAVENOUS | Status: AC

## 2020-03-05 MED ORDER — LIDOCAINE-EPINEPHRINE 1 %-1:100000 IJ SOLN
INTRAMUSCULAR | Status: AC
  Filled 2020-03-05: qty 1

## 2020-03-05 MED ORDER — CEFAZOLIN SODIUM-DEXTROSE 2-4 GM/100ML-% IV SOLN
INTRAVENOUS | Status: AC
  Administered 2020-03-05: 2 g via INTRAVENOUS
  Filled 2020-03-05: qty 100

## 2020-03-05 NOTE — H&P (Signed)
Chief Complaint: Patient was seen in consultation today for Stage III colon cancer  Referring Physician(s): Ennever,Peter R  Supervising Physician: Arne Cleveland  Patient Status: [place]  History of Present Illness: John [name] is a 65 y.o. 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  He is s/p robotic converted to open sigmoidectomy with lysis of adhesions.  He now has plans for upcoming chemotherapy and is in need of durable venous access. IR consulted for Port-A-Cath placement at the request of Dr. Marin Olp.   Patient assessed at bedside this afternoon.  He is in good spirits and denies new concerns or symptoms.  He did speak with Joelle at the cancer center over the phone to discuss Port and is aware of the goals of procedure today.  He has been NPO. Denies fever, chills, cough, shortness of breath, nausea, vomiting, abdominal pain, dysuria.  He is agreeable to Port-A-Cath placement today.   Past Medical History:  Diagnosis Date  . Anemia   . Arthritis   . Cancer of right kidney ([place])    Lt. nephrectomy  . Colon cancer ([place]) 12/11/2019  . Family history of colon cancer   . Family history of prostate cancer   . Hypertension   . Lower GI bleed 12/11/2019  . Multiple thyroid nodules   . Personal history of kidney cancer   . Stage III carcinoma of colon ([place]) 02/28/2020    Past Surgical History:  Procedure Laterality Date  . CHOLECYSTECTOMY    . COLONOSCOPY  12/04/2019  . FLEXIBLE SIGMOIDOSCOPY N/A 01/31/2020   Procedure: FLEXIBLE SIGMOIDOSCOPY;  Surgeon: Ileana Roup, MD;  Location: [facility];  Service: General;  Laterality: N/A;  . HERNIA REPAIR    . INCISION AND DRAINAGE / EXCISION THYROGLOSSAL CYST    . LYSIS OF ADHESION N/A 01/31/2020   Procedure: LYSIS OF ADHESION;  Surgeon: Ileana Roup, MD;  Location: [facility];  Service: General;  Laterality:  N/A;  . MENISCUS REPAIR Left   . MENISECTOMY Right   . NEPHRECTOMY RADICAL    . THYROIDECTOMY, PARTIAL    . UPPER GASTROINTESTINAL ENDOSCOPY  12/04/2019    Allergies: Erythromycin, Lisinopril, and Other  Medications: Prior to Admission medications   Medication Sig Start Date End Date Taking? Authorizing Provider  pantoprazole (PROTONIX) 40 MG tablet Take 1 tablet (40 mg total) by mouth 2 (two) times daily. 12/04/19 01/21/20 Yes Cirigliano, Vito V, DO  telmisartan (MICARDIS) 40 MG tablet Take 40 mg by mouth daily. 09/09/19  Yes [provider]  dexamethasone (DECADRON) 4 MG tablet Take 2 tablets (8 mg total) by mouth daily. Start the day after chemotherapy for 2 days. Take with food. 03/02/20   Volanda Napoleon, MD  lidocaine-prilocaine (EMLA) cream Apply to affected area once 03/02/20   Volanda Napoleon, MD  ondansetron (ZOFRAN) 8 MG tablet Take 1 tablet (8 mg total) by mouth 2 (two) times daily as needed for refractory nausea / vomiting. Start on day 3 after chemotherapy. 03/02/20   Volanda Napoleon, MD  prochlorperazine (COMPAZINE) 10 MG tablet Take 1 tablet (10 mg total) by mouth every 6 (six) hours as needed (Nausea or vomiting). 03/02/20   Volanda Napoleon, MD     Family History  Problem Relation Age of Onset  . Valvular heart disease Father   . Colon cancer Maternal Grandfather 90  . Prostate cancer Maternal Grandfather 90  .  Stroke Maternal Grandfather   . Liver cancer Paternal Uncle        probable mets, unknown primary  . Dementia Paternal Uncle   . Stroke Maternal Grandmother   . Stroke Paternal Grandmother   . AAA (abdominal aortic aneurysm) Paternal Grandmother   . Dementia Paternal Grandfather   . Other Paternal Uncle        Amyloidosis  . Colon polyps Neg Hx   . Esophageal cancer Neg Hx   . Rectal cancer Neg Hx   . Stomach cancer Neg Hx     Social History   Socioeconomic History  . Marital status: Married    Spouse name: Not on file  . Number of  children: Not on file  . Years of education: Not on file  . Highest education level: Not on file  Occupational History  . Not on file  Tobacco Use  . Smoking status: Former Smoker    Quit date: 1993    Years since quitting: 29.0  . Smokeless tobacco: Never Used  Vaping Use  . Vaping Use: Never used  Substance and Sexual Activity  . Alcohol use: Yes    Comment: occ  . Drug use: Never  . Sexual activity: Not on file  Other Topics Concern  . Not on file  Social History Narrative  . Not on file   Social Determinants of Health   Financial Resource Strain: Not on file  Food Insecurity: Not on file  Transportation Needs: Not on file  Physical Activity: Not on file  Stress: Not on file  Social Connections: Not on file     Review of Systems: A 12 point ROS discussed and pertinent positives are indicated in the HPI above.  All other systems are negative.  Review of Systems  Constitutional: Negative for fatigue and fever.  Respiratory: Negative for cough and shortness of breath.   Cardiovascular: Negative for chest pain.  Gastrointestinal: Negative for abdominal pain, nausea and vomiting.  Genitourinary: Negative for dysuria.  Musculoskeletal: Negative for back pain.  Psychiatric/Behavioral: Negative for behavioral problems and confusion.    Vital Signs: BP (!) 165/80 (BP Location: Right Arm)   Pulse 70   Temp 97.8 F (36.6 C) (Oral)   Resp 18   SpO2 100%   Physical Exam Vitals and nursing note reviewed.  Constitutional:      General: He is not in acute distress.    Appearance: Normal appearance. He is not ill-appearing.  HENT:     Mouth/Throat:     Mouth: Mucous membranes are moist.     Pharynx: Oropharynx is clear.  Cardiovascular:     Rate and Rhythm: Normal rate and regular rhythm.     Pulses: Normal pulses.  Pulmonary:     Effort: Pulmonary effort is normal. No respiratory distress.     Breath sounds: Normal breath sounds.  Abdominal:     General:  Abdomen is flat.     Palpations: Abdomen is soft.  Musculoskeletal:     Cervical back: Normal range of motion and neck supple.  Skin:    General: Skin is warm and dry.  Neurological:     General: No focal deficit present.     Mental Status: He is alert and oriented to person, place, and time. Mental status is at baseline.  Psychiatric:        Mood and Affect: Mood normal.        Behavior: Behavior normal.        Thought Content:  Thought content normal.        Judgment: Judgment normal.      MD Evaluation Airway: WNL Heart: WNL Abdomen: WNL Chest/ Lungs: WNL ASA  Classification: 3 Mallampati/Airway Score: Two   Imaging: No results found.  Labs:  CBC: Recent Labs    02/01/20 0518 02/02/20 0525 02/28/20 1149 03/05/20 1315  WBC 10.4 8.8 6.7 7.5  HGB 11.2* 11.6* 11.9* 12.3*  HCT 36.4* 36.7* 37.6* 39.1  PLT 330 313 297 295    COAGS: Recent Labs    09/02/19 0000 01/22/20 1200  INR 1.1 1.1  APTT  --  25    BMP: Recent Labs    12/04/19 1310 12/11/19 1143 02/01/20 0518 02/02/20 0525 02/03/20 0437 02/28/20 1149  NA  --    < > 135 135 139 140  K  --    < > 4.5 3.8 3.9 4.1  CL  --    < > 102 101 106 104  CO2  --    < > 22 23 22 28   GLUCOSE  --    < > 124* 97 114* 90  BUN 12   < > 11 13 10 13   CALCIUM  --    < > 8.6* 9.0 8.7* 9.5  CREATININE 1.14   < > 0.99 1.14 0.97 1.05  GFRNONAA 67   < > >60 >60 >60 >60  GFRAA 78  --   --   --   --   --    < > = values in this interval not displayed.    LIVER FUNCTION TESTS: Recent Labs    12/11/19 1143 01/22/20 1200 02/03/20 0437 02/28/20 1149  BILITOT 0.3 0.8 0.6 0.5  AST 10* 16 15 14*  ALT 11 16 15 18   ALKPHOS 64 77 52 69  PROT 6.9 7.7 6.5 6.7  ALBUMIN 4.0 4.0 3.3* 4.3    TUMOR MARKERS: Recent Labs    12/04/19 1310  CEA <0.5    Assessment and Plan: Patient with past medical history of HTN, left renal cancer s/p nephrectomy presents with complaint of stage III adenocarcinoma of the sigmoid  colon. He is s/p surgical resection, now with plans for chemotherapy. IR consulted for Port-A-Cath placement at the request of Dr. Marin Olp. Case reviewed by Dr. Vernard Gambles who approves patient for procedure.  Patient presents today in their usual state of health.  He has been NPO and is not currently on blood thinners.    Risks and benefits of image guided port-a-catheter placement was discussed with the patient including, but not limited to bleeding, infection, pneumothorax, or fibrin sheath development and need for additional procedures.  All of the patient's questions were answered, patient is agreeable to proceed. Consent signed and in chart.  Thank you for this interesting consult.  I greatly enjoyed meeting Estiben Vlad and look forward to participating in their care.  A copy of this report was sent to the requesting provider on this date.  Electronically Signed: Docia Barrier, PA 03/05/2020, 2:24 PM   I spent a total of  30 Minutes   in face to face in clinical consultation, greater than 50% of which was counseling/coordinating care for stage III colon cancer.

## 2020-03-05 NOTE — Discharge Instructions (Signed)
Please call Interventional Radiology clinic [phone] with any questions or concerns.  You may remove your dressing and shower tomorrow.  DO NOT use EMLA cream for 2 weeks after port placement as this cream will remove surgical glue on your incision.   Implanted Port Insertion, Care After This sheet gives you information about how to care for yourself after your procedure. Your health care provider may also give you more specific instructions. If you have problems or questions, contact your health care provider. What can I expect after the procedure? After the procedure, it is common to have:  Discomfort at the port insertion site.  Bruising on the skin over the port. This should improve over 3-4 days. Follow these instructions at home: Port care  After your port is placed, you will get a manufacturer's information card. The card has information about your port. Keep this card with you at all times.  Take care of the port as told by your health care provider. Ask your health care provider if you or a family member can get training for taking care of the port at home. A home health care nurse may also take care of the port.  Make sure to remember what type of port you have. Incision care  Follow instructions from your health care provider about how to take care of your port insertion site. Make sure you: ? Wash your hands with soap and water before and after you change your bandage (dressing). If soap and water are not available, use hand sanitizer. ? Change your dressing as told by your health care provider. ? Leave stitches (sutures), skin glue, or adhesive strips in place. These skin closures may need to stay in place for 2 weeks or longer. If adhesive strip edges start to loosen and curl up, you may trim the loose edges. Do not remove adhesive strips completely unless your health care provider tells you to do that.  Check your port insertion site every day for signs of infection.  Check for: ? Redness, swelling, or pain. ? Fluid or blood. ? Warmth. ? Pus or a bad smell.      Activity  Return to your normal activities as told by your health care provider. Ask your health care provider what activities are safe for you.  Do not lift anything that is heavier than 10 lb (4.5 kg), or the limit that you are told, until your health care provider says that it is safe. General instructions  Take over-the-counter and prescription medicines only as told by your health care provider.  Do not take baths, swim, or use a hot tub until your health care provider approves. Ask your health care provider if you may take showers. You may only be allowed to take sponge baths.  Do not drive for 24 hours if you were given a sedative during your procedure.  Wear a medical alert bracelet in case of an emergency. This will tell any health care providers that you have a port.  Keep all follow-up visits as told by your health care provider. This is important. Contact a health care provider if:  You cannot flush your port with saline as directed, or you cannot draw blood from the port.  You have a fever or chills.  You have redness, swelling, or pain around your port insertion site.  You have fluid or blood coming from your port insertion site.  Your port insertion site feels warm to the touch.  You have pus or a   bad smell coming from the port insertion site. Get help right away if:  You have chest pain or shortness of breath.  You have bleeding from your port that you cannot control. Summary  Take care of the port as told by your health care provider. Keep the manufacturer's information card with you at all times.  Change your dressing as told by your health care provider.  Contact a health care provider if you have a fever or chills or if you have redness, swelling, or pain around your port insertion site.  Keep all follow-up visits as told by your health care  provider. This information is not intended to replace advice given to you by your health care provider. Make sure you discuss any questions you have with your health care provider. Document Revised: 08/29/2017 Document Reviewed: 08/29/2017 Elsevier Patient Education  2021 Elsevier Inc.    Moderate Conscious Sedation, Adult, Care After This sheet gives you information about how to care for yourself after your procedure. Your health care provider may also give you more specific instructions. If you have problems or questions, contact your health care provider. What can I expect after the procedure? After the procedure, it is common to have:  Sleepiness for several hours.  Impaired judgment for several hours.  Difficulty with balance.  Vomiting if you eat too soon. Follow these instructions at home: For the time period you were told by your health care provider:  Rest.  Do not participate in activities where you could fall or become injured.  Do not drive or use machinery.  Do not drink alcohol.  Do not take sleeping pills or medicines that cause drowsiness.  Do not make important decisions or sign legal documents.  Do not take care of children on your own.      Eating and drinking  Follow the diet recommended by your health care provider.  Drink enough fluid to keep your urine pale yellow.  If you vomit: ? Drink water, juice, or soup when you can drink without vomiting. ? Make sure you have little or no nausea before eating solid foods.   General instructions  Take over-the-counter and prescription medicines only as told by your health care provider.  Have a responsible adult stay with you for the time you are told. It is important to have someone help care for you until you are awake and alert.  Do not smoke.  Keep all follow-up visits as told by your health care provider. This is important. Contact a health care provider if:  You are still sleepy or having  trouble with balance after 24 hours.  You feel light-headed.  You keep feeling nauseous or you keep vomiting.  You develop a rash.  You have a fever.  You have redness or swelling around the IV site. Get help right away if:  You have trouble breathing.  You have new-onset confusion at home. Summary  After the procedure, it is common to feel sleepy, have impaired judgment, or feel nauseous if you eat too soon.  Rest after you get home. Know the things you should not do after the procedure.  Follow the diet recommended by your health care provider and drink enough fluid to keep your urine pale yellow.  Get help right away if you have trouble breathing or new-onset confusion at home. This information is not intended to replace advice given to you by your health care provider. Make sure you discuss any questions you have with your health   care provider. Document Revised: 05/31/2019 Document Reviewed: 12/27/2018 Elsevier Patient Education  2021 Elsevier Inc.  

## 2020-03-05 NOTE — Procedures (Signed)
  Procedure: R IJ Port catheter   EBL:   minimal Complications:  none immediate  See full dictation in [employer].  Dillard Cannon MD Main # [phone] Pager  [phone] Mobile [phone]

## 2020-03-09 ENCOUNTER — Encounter: Payer: Self-pay | Admitting: *Deleted

## 2020-03-09 DIAGNOSIS — Z95828 Presence of other vascular implants and grafts: Secondary | ICD-10-CM | POA: Insufficient documentation

## 2020-03-10 ENCOUNTER — Inpatient Hospital Stay: Payer: Medicare Other

## 2020-03-10 ENCOUNTER — Encounter: Payer: Self-pay | Admitting: *Deleted

## 2020-03-10 ENCOUNTER — Other Ambulatory Visit: Payer: Self-pay

## 2020-03-10 VITALS — BP 155/75 | HR 69 | Temp 98.1°F | Resp 18

## 2020-03-10 DIAGNOSIS — C189 Malignant neoplasm of colon, unspecified: Secondary | ICD-10-CM

## 2020-03-10 DIAGNOSIS — D500 Iron deficiency anemia secondary to blood loss (chronic): Secondary | ICD-10-CM

## 2020-03-10 DIAGNOSIS — Z5111 Encounter for antineoplastic chemotherapy: Secondary | ICD-10-CM | POA: Diagnosis not present

## 2020-03-10 DIAGNOSIS — K922 Gastrointestinal hemorrhage, unspecified: Secondary | ICD-10-CM

## 2020-03-10 LAB — CMP (CANCER CENTER ONLY)
ALT: 18 U/L (ref 0–44)
AST: 14 U/L — ABNORMAL LOW (ref 15–41)
Albumin: 4.2 g/dL (ref 3.5–5.0)
Alkaline Phosphatase: 69 U/L (ref 38–126)
Anion gap: 9 (ref 5–15)
BUN: 17 mg/dL (ref 8–23)
CO2: 26 mmol/L (ref 22–32)
Calcium: 9.5 mg/dL (ref 8.9–10.3)
Chloride: 105 mmol/L (ref 98–111)
Creatinine: 1.03 mg/dL (ref 0.61–1.24)
GFR, Estimated: >60 mL/min (ref >60)
Glucose, Bld: 93 mg/dL (ref 70–99)
Potassium: 3.8 mmol/L (ref 3.5–5.1)
Sodium: 140 mmol/L (ref 135–145)
Total Bilirubin: 0.7 mg/dL (ref 0.3–1.2)
Total Protein: 6.7 g/dL (ref 6.5–8.1)

## 2020-03-10 LAB — CBC WITH DIFFERENTIAL (CANCER CENTER ONLY)
Abs Immature Granulocytes: 0.03 10*3/uL (ref 0.00–0.07)
Basophils Absolute: 0.1 10*3/uL (ref 0.0–0.1)
Basophils Relative: 1 %
Eosinophils Absolute: 0 10*3/uL (ref 0.0–0.5)
Eosinophils Relative: 1 %
HCT: 36.6 % — ABNORMAL LOW (ref 39.0–52.0)
Hemoglobin: 11.8 g/dL — ABNORMAL LOW (ref 13.0–17.0)
Immature Granulocytes: 1 %
Lymphocytes Relative: 27 %
Lymphs Abs: 1.7 10*3/uL (ref 0.7–4.0)
MCH: 27.4 pg (ref 26.0–34.0)
MCHC: 32.2 g/dL (ref 30.0–36.0)
MCV: 84.9 fL (ref 80.0–100.0)
Monocytes Absolute: 0.5 10*3/uL (ref 0.1–1.0)
Monocytes Relative: 8 %
Neutro Abs: 4.1 10*3/uL (ref 1.7–7.7)
Neutrophils Relative %: 62 %
Platelet Count: 266 10*3/uL (ref 150–400)
RBC: 4.31 MIL/uL (ref 4.22–5.81)
RDW: 16.3 % — ABNORMAL HIGH (ref 11.5–15.5)
WBC Count: 6.5 10*3/uL (ref 4.0–10.5)
nRBC: 0 % (ref 0.0–0.2)

## 2020-03-10 MED ORDER — DEXTROSE 5 % IV SOLN
Freq: Once | INTRAVENOUS | Status: AC
  Filled 2020-03-10: qty 250

## 2020-03-10 MED ORDER — SODIUM CHLORIDE 0.9 % IV SOLN
2400.0000 mg/m2 | INTRAVENOUS | Status: DC
  Administered 2020-03-10: 6100 mg via INTRAVENOUS
  Filled 2020-03-10: qty 122

## 2020-03-10 MED ORDER — PALONOSETRON HCL INJECTION 0.25 MG/5ML
INTRAVENOUS | Status: AC
  Filled 2020-03-10: qty 5

## 2020-03-10 MED ORDER — PALONOSETRON HCL INJECTION 0.25 MG/5ML
0.2500 mg | Freq: Once | INTRAVENOUS | Status: AC
Start: 2020-03-10 — End: 2020-03-10
  Administered 2020-03-10: 0.25 mg via INTRAVENOUS

## 2020-03-10 MED ORDER — OXALIPLATIN CHEMO INJECTION 100 MG/20ML
79.0000 mg/m2 | Freq: Once | INTRAVENOUS | Status: AC
  Administered 2020-03-10: 200 mg via INTRAVENOUS
  Filled 2020-03-10: qty 40

## 2020-03-10 MED ORDER — DEXTROSE 5 % IV SOLN
400.0000 mg/m2 | Freq: Once | INTRAVENOUS | Status: AC
  Administered 2020-03-10: 1016 mg via INTRAVENOUS
  Filled 2020-03-10: qty 50.8

## 2020-03-10 MED ORDER — FLUOROURACIL CHEMO INJECTION 2.5 GM/50ML
400.0000 mg/m2 | Freq: Once | INTRAVENOUS | Status: AC
  Administered 2020-03-10: 1000 mg via INTRAVENOUS
  Filled 2020-03-10: qty 20

## 2020-03-10 MED ORDER — SODIUM CHLORIDE 0.9 % IV SOLN
10.0000 mg | Freq: Once | INTRAVENOUS | Status: AC
  Administered 2020-03-10: 10 mg via INTRAVENOUS
  Filled 2020-03-10: qty 10

## 2020-03-10 MED ORDER — SODIUM CHLORIDE 0.9% FLUSH
10.0000 mL | Freq: Once | INTRAVENOUS | Status: AC | PRN
  Administered 2020-03-10: 10 mL
  Filled 2020-03-10: qty 10

## 2020-03-10 NOTE — Progress Notes (Signed)
Oncology Nurse Navigator Documentation  Oncology Nurse Navigator Flowsheets 03/10/2020  Abnormal Finding Date -  Diagnosis Status -  Phase of Treatment Chemo  Chemotherapy Actual Start Date: 03/10/2020  Expected Surgery Date -  Surgery Actual Start Date: -  Navigator Follow Up Date: 03/24/2020  Navigator Follow Up Reason: Follow-up Appointment;Chemotherapy  Navigator Location [facility]  Referral Date to RadOnc/MedOnc -  Navigator Encounter Type Treatment  Telephone -  Treatment Initiated Date 03/10/2020  Patient Visit Type MedOnc  Treatment Phase First Chemo Tx  Barriers/Navigation Needs Coordination of Care;Education  Education Other  Interventions Coordination of Care;Education;Psycho-Social Support  Acuity Level 2-Minimal Needs (1-2 Barriers Identified)  Coordination of Care Other  Education Method Verbal  Support Groups/Services Friends and Family  Time Spent with Patient 30

## 2020-03-10 NOTE — Patient Instructions (Addendum)
[facility] Discharge Instructions for Patients Receiving Chemotherapy  Today you received the following chemotherapy agents Oxaliplatin/Leucovorin/5 FU   To help prevent nausea and vomiting after your treatment, we encourage you to take your nausea medication as prescribed.   If you develop nausea and vomiting that is not controlled by your nausea medication, call the clinic.   BELOW ARE SYMPTOMS THAT SHOULD BE REPORTED IMMEDIATELY:  *FEVER GREATER THAN 100.5 F  *CHILLS WITH OR WITHOUT FEVER  NAUSEA AND VOMITING THAT IS NOT CONTROLLED WITH YOUR NAUSEA MEDICATION  *UNUSUAL SHORTNESS OF BREATH  *UNUSUAL BRUISING OR BLEEDING  TENDERNESS IN MOUTH AND THROAT WITH OR WITHOUT PRESENCE OF ULCERS  *URINARY PROBLEMS  *BOWEL PROBLEMS  UNUSUAL RASH Items with * indicate a potential emergency and should be followed up as soon as possible.  Feel free to call the clinic should you have any questions or concerns. The clinic phone number is [phone].  Please show the [facility] at check-in to the Emergency Department and triage nurse.  Oxaliplatin Injection What is this medicine? OXALIPLATIN (ox AL i PLA tin) is a chemotherapy drug. It targets fast dividing cells, like cancer cells, and causes these cells to die. This medicine is used to treat cancers of the colon and rectum, and many other cancers. This medicine may be used for other purposes; ask your health care provider or pharmacist if you have questions. COMMON BRAND NAME(S): Eloxatin What should I tell my health care provider before I take this medicine? They need to know if you have any of these conditions:  heart disease  history of irregular heartbeat  liver disease  low blood counts, like white cells, platelets, or red blood cells  lung or breathing disease, like asthma  take medicines that treat or prevent blood clots  tingling of the fingers or toes, or other nerve disorder  an unusual or  allergic reaction to oxaliplatin, other chemotherapy, other medicines, foods, dyes, or preservatives  pregnant or trying to get pregnant  breast-feeding How should I use this medicine? This drug is given as an infusion into a vein. It is administered in a hospital or clinic by a specially trained health care professional. Talk to your pediatrician regarding the use of this medicine in children. Special care may be needed. Overdosage: If you think you have taken too much of this medicine contact a poison control center or emergency room at once. NOTE: This medicine is only for you. Do not share this medicine with others. What if I miss a dose? It is important not to miss a dose. Call your doctor or health care professional if you are unable to keep an appointment. What may interact with this medicine? Do not take this medicine with any of the following medications:  cisapride  dronedarone  pimozide  thioridazine This medicine may also interact with the following medications:  aspirin and aspirin-like medicines  certain medicines that treat or prevent blood clots like warfarin, apixaban, dabigatran, and rivaroxaban  cisplatin  cyclosporine  diuretics  medicines for infection like acyclovir, adefovir, amphotericin B, bacitracin, cidofovir, foscarnet, ganciclovir, gentamicin, pentamidine, vancomycin  NSAIDs, medicines for pain and inflammation, like ibuprofen or naproxen  other medicines that prolong the QT interval (an abnormal heart rhythm)  pamidronate  zoledronic acid This list may not describe all possible interactions. Give your health care provider a list of all the medicines, herbs, non-prescription drugs, or dietary supplements you use. Also tell them if you smoke, drink alcohol,  or use illegal drugs. Some items may interact with your medicine. What should I watch for while using this medicine? Your condition will be monitored carefully while you are receiving this  medicine. You may need blood work done while you are taking this medicine. This medicine may make you feel generally unwell. This is not uncommon as chemotherapy can affect healthy cells as well as cancer cells. Report any side effects. Continue your course of treatment even though you feel ill unless your healthcare professional tells you to stop. This medicine can make you more sensitive to cold. Do not drink cold drinks or use ice. Cover exposed skin before coming in contact with cold temperatures or cold objects. When out in cold weather wear warm clothing and cover your mouth and nose to warm the air that goes into your lungs. Tell your doctor if you get sensitive to the cold. Do not become pregnant while taking this medicine or for 9 months after stopping it. Women should inform their health care professional if they wish to become pregnant or think they might be pregnant. Men should not father a child while taking this medicine and for 6 months after stopping it. There is potential for serious side effects to an unborn child. Talk to your health care professional for more information. Do not breast-feed a child while taking this medicine or for 3 months after stopping it. This medicine has caused ovarian failure in some women. This medicine may make it more difficult to get pregnant. Talk to your health care professional if you are concerned about your fertility. This medicine has caused decreased sperm counts in some men. This may make it more difficult to father a child. Talk to your health care professional if you are concerned about your fertility. This medicine may increase your risk of getting an infection. Call your health care professional for advice if you get a fever, chills, or sore throat, or other symptoms of a cold or flu. Do not treat yourself. Try to avoid being around people who are sick. Avoid taking medicines that contain aspirin, acetaminophen, ibuprofen, naproxen, or ketoprofen  unless instructed by your health care professional. These medicines may hide a fever. Be careful brushing or flossing your teeth or using a toothpick because you may get an infection or bleed more easily. If you have any dental work done, tell your dentist you are receiving this medicine. What side effects may I notice from receiving this medicine? Side effects that you should report to your doctor or health care professional as soon as possible:  allergic reactions like skin rash, itching or hives, swelling of the face, lips, or tongue  breathing problems  cough  low blood counts - this medicine may decrease the number of white blood cells, red blood cells, and platelets. You may be at increased risk for infections and bleeding  nausea, vomiting  pain, redness, or irritation at site where injected  pain, tingling, numbness in the hands or feet  signs and symptoms of bleeding such as bloody or black, tarry stools; red or dark brown urine; spitting up blood or brown material that looks like coffee grounds; red spots on the skin; unusual bruising or bleeding from the eyes, gums, or nose  signs and symptoms of a dangerous change in heartbeat or heart rhythm like chest pain; dizziness; fast, irregular heartbeat; palpitations; feeling faint or lightheaded; falls  signs and symptoms of infection like fever; chills; cough; sore throat; pain or trouble passing urine    signs and symptoms of liver injury like dark yellow or brown urine; general ill feeling or flu-like symptoms; light-colored stools; loss of appetite; nausea; right upper belly pain; unusually weak or tired; yellowing of the eyes or skin  signs and symptoms of low red blood cells or anemia such as unusually weak or tired; feeling faint or lightheaded; falls  signs and symptoms of muscle injury like dark urine; trouble passing urine or change in the amount of urine; unusually weak or tired; muscle pain; back pain Side effects that  usually do not require medical attention (report to your doctor or health care professional if they continue or are bothersome):  changes in taste  diarrhea  gas  hair loss  loss of appetite  mouth sores This list may not describe all possible side effects. Call your doctor for medical advice about side effects. You may report side effects to FDA at 1-800-FDA-1088. Where should I keep my medicine? This drug is given in a hospital or clinic and will not be stored at home. NOTE: This sheet is a summary. It may not cover all possible information. If you have questions about this medicine, talk to your doctor, pharmacist, or health care provider.  2021 Elsevier/Gold Standard (2018-06-20 12:20:35)  Leucovorin injection What is this medicine? LEUCOVORIN (loo koe VOR in) is used to prevent or treat the harmful effects of some medicines. This medicine is used to treat anemia caused by a low amount of folic acid in the body. It is also used with 5-fluorouracil (5-FU) to treat colon cancer. This medicine may be used for other purposes; ask your health care provider or pharmacist if you have questions. What should I tell my health care provider before I take this medicine? They need to know if you have any of these conditions:  anemia from low levels of vitamin B-12 in the blood  an unusual or allergic reaction to leucovorin, folic acid, other medicines, foods, dyes, or preservatives  pregnant or trying to get pregnant  breast-feeding How should I use this medicine? This medicine is for injection into a muscle or into a vein. It is given by a health care professional in a hospital or clinic setting. Talk to your pediatrician regarding the use of this medicine in children. Special care may be needed. Overdosage: If you think you have taken too much of this medicine contact a poison control center or emergency room at once. NOTE: This medicine is only for you. Do not share this medicine with  others. What if I miss a dose? This does not apply. What may interact with this medicine?  capecitabine  fluorouracil  phenobarbital  phenytoin  primidone  trimethoprim-sulfamethoxazole This list may not describe all possible interactions. Give your health care provider a list of all the medicines, herbs, non-prescription drugs, or dietary supplements you use. Also tell them if you smoke, drink alcohol, or use illegal drugs. Some items may interact with your medicine. What should I watch for while using this medicine? Your condition will be monitored carefully while you are receiving this medicine. This medicine may increase the side effects of 5-fluorouracil, 5-FU. Tell your doctor or health care professional if you have diarrhea or mouth sores that do not get better or that get worse. What side effects may I notice from receiving this medicine? Side effects that you should report to your doctor or health care professional as soon as possible:  allergic reactions like skin rash, itching or hives, swelling of the face,  lips, or tongue  breathing problems  fever, infection  mouth sores  unusual bleeding or bruising  unusually weak or tired Side effects that usually do not require medical attention (report to your doctor or health care professional if they continue or are bothersome):  constipation or diarrhea  loss of appetite  nausea, vomiting This list may not describe all possible side effects. Call your doctor for medical advice about side effects. You may report side effects to FDA at 1-800-FDA-1088. Where should I keep my medicine? This drug is given in a hospital or clinic and will not be stored at home. NOTE: This sheet is a summary. It may not cover all possible information. If you have questions about this medicine, talk to your doctor, pharmacist, or health care provider.  2021 Elsevier/Gold Standard (2007-08-07 16:50:29)  Fluorouracil, 5-FU injection What  is this medicine? FLUOROURACIL, 5-FU (flure oh YOOR a sil) is a chemotherapy drug. It slows the growth of cancer cells. This medicine is used to treat many types of cancer like breast cancer, colon or rectal cancer, pancreatic cancer, and stomach cancer. This medicine may be used for other purposes; ask your health care provider or pharmacist if you have questions. COMMON BRAND NAME(S): Adrucil What should I tell my health care provider before I take this medicine? They need to know if you have any of these conditions:  blood disorders  dihydropyrimidine dehydrogenase (DPD) deficiency  infection (especially a virus infection such as chickenpox, cold sores, or herpes)  kidney disease  liver disease  malnourished, poor nutrition  recent or ongoing radiation therapy  an unusual or allergic reaction to fluorouracil, other chemotherapy, other medicines, foods, dyes, or preservatives  pregnant or trying to get pregnant  breast-feeding How should I use this medicine? This drug is given as an infusion or injection into a vein. It is administered in a hospital or clinic by a specially trained health care professional. Talk to your pediatrician regarding the use of this medicine in children. Special care may be needed. Overdosage: If you think you have taken too much of this medicine contact a poison control center or emergency room at once. NOTE: This medicine is only for you. Do not share this medicine with others. What if I miss a dose? It is important not to miss your dose. Call your doctor or health care professional if you are unable to keep an appointment. What may interact with this medicine? Do not take this medicine with any of the following medications:  live virus vaccines This medicine may also interact with the following medications:  medicines that treat or prevent blood clots like warfarin, enoxaparin, and dalteparin This list may not describe all possible interactions.  Give your health care provider a list of all the medicines, herbs, non-prescription drugs, or dietary supplements you use. Also tell them if you smoke, drink alcohol, or use illegal drugs. Some items may interact with your medicine. What should I watch for while using this medicine? Visit your doctor for checks on your progress. This drug may make you feel generally unwell. This is not uncommon, as chemotherapy can affect healthy cells as well as cancer cells. Report any side effects. Continue your course of treatment even though you feel ill unless your doctor tells you to stop. In some cases, you may be given additional medicines to help with side effects. Follow all directions for their use. Call your doctor or health care professional for advice if you get a fever, chills or   sore throat, or other symptoms of a cold or flu. Do not treat yourself. This drug decreases your body's ability to fight infections. Try to avoid being around people who are sick. This medicine may increase your risk to bruise or bleed. Call your doctor or health care professional if you notice any unusual bleeding. Be careful brushing and flossing your teeth or using a toothpick because you may get an infection or bleed more easily. If you have any dental work done, tell your dentist you are receiving this medicine. Avoid taking products that contain aspirin, acetaminophen, ibuprofen, naproxen, or ketoprofen unless instructed by your doctor. These medicines may hide a fever. Do not become pregnant while taking this medicine. Women should inform their doctor if they wish to become pregnant or think they might be pregnant. There is a potential for serious side effects to an unborn child. Talk to your health care professional or pharmacist for more information. Do not breast-feed an infant while taking this medicine. Men should inform their doctor if they wish to father a child. This medicine may lower sperm counts. Do not treat  diarrhea with over the counter products. Contact your doctor if you have diarrhea that lasts more than 2 days or if it is severe and watery. This medicine can make you more sensitive to the sun. Keep out of the sun. If you cannot avoid being in the sun, wear protective clothing and use sunscreen. Do not use sun lamps or tanning beds/booths. What side effects may I notice from receiving this medicine? Side effects that you should report to your doctor or health care professional as soon as possible:  allergic reactions like skin rash, itching or hives, swelling of the face, lips, or tongue  low blood counts - this medicine may decrease the number of white blood cells, red blood cells and platelets. You may be at increased risk for infections and bleeding.  signs of infection - fever or chills, cough, sore throat, pain or difficulty passing urine  signs of decreased platelets or bleeding - bruising, pinpoint red spots on the skin, black, tarry stools, blood in the urine  signs of decreased red blood cells - unusually weak or tired, fainting spells, lightheadedness  breathing problems  changes in vision  chest pain  mouth sores  nausea and vomiting  pain, swelling, redness at site where injected  pain, tingling, numbness in the hands or feet  redness, swelling, or sores on hands or feet  stomach pain  unusual bleeding Side effects that usually do not require medical attention (report to your doctor or health care professional if they continue or are bothersome):  changes in finger or toe nails  diarrhea  dry or itchy skin  hair loss  headache  loss of appetite  sensitivity of eyes to the light  stomach upset  unusually teary eyes This list may not describe all possible side effects. Call your doctor for medical advice about side effects. You may report side effects to FDA at 1-800-FDA-1088. Where should I keep my medicine? This drug is given in a hospital or clinic  and will not be stored at home. NOTE: This sheet is a summary. It may not cover all possible information. If you have questions about this medicine, talk to your doctor, pharmacist, or health care provider.  2021 Elsevier/Gold Standard (2019-01-01 15:00:03)

## 2020-03-10 NOTE — Patient Instructions (Signed)

## 2020-03-12 ENCOUNTER — Inpatient Hospital Stay: Payer: Medicare Other

## 2020-03-12 ENCOUNTER — Other Ambulatory Visit: Payer: Self-pay

## 2020-03-12 VITALS — BP 144/85 | HR 85 | Temp 98.1°F | Resp 18

## 2020-03-12 DIAGNOSIS — Z5111 Encounter for antineoplastic chemotherapy: Secondary | ICD-10-CM | POA: Diagnosis not present

## 2020-03-12 DIAGNOSIS — C189 Malignant neoplasm of colon, unspecified: Secondary | ICD-10-CM

## 2020-03-12 MED ORDER — HEPARIN SOD (PORK) LOCK FLUSH 100 UNIT/ML IV SOLN
500.0000 [IU] | Freq: Once | INTRAVENOUS | Status: AC | PRN
  Administered 2020-03-12: 500 [IU]
  Filled 2020-03-12: qty 5

## 2020-03-12 MED ORDER — SODIUM CHLORIDE 0.9% FLUSH
10.0000 mL | INTRAVENOUS | Status: DC | PRN
  Administered 2020-03-12: 10 mL
  Filled 2020-03-12: qty 10

## 2020-03-12 NOTE — Patient Instructions (Signed)

## 2020-03-13 ENCOUNTER — Other Ambulatory Visit: Payer: Self-pay | Admitting: Hematology & Oncology

## 2020-03-13 DIAGNOSIS — C189 Malignant neoplasm of colon, unspecified: Secondary | ICD-10-CM

## 2020-03-24 ENCOUNTER — Telehealth: Payer: Self-pay

## 2020-03-24 ENCOUNTER — Inpatient Hospital Stay: Payer: Medicare Other | Attending: Hematology & Oncology

## 2020-03-24 ENCOUNTER — Inpatient Hospital Stay (HOSPITAL_BASED_OUTPATIENT_CLINIC_OR_DEPARTMENT_OTHER): Payer: Medicare Other | Admitting: Hematology & Oncology

## 2020-03-24 ENCOUNTER — Other Ambulatory Visit: Payer: Self-pay

## 2020-03-24 ENCOUNTER — Encounter: Payer: Self-pay | Admitting: *Deleted

## 2020-03-24 ENCOUNTER — Inpatient Hospital Stay: Payer: Medicare Other

## 2020-03-24 ENCOUNTER — Encounter: Payer: Self-pay | Admitting: Hematology & Oncology

## 2020-03-24 VITALS — BP 162/80 | HR 81 | Temp 98.4°F | Resp 18 | Wt 259.5 lb

## 2020-03-24 DIAGNOSIS — F32A Depression, unspecified: Secondary | ICD-10-CM | POA: Insufficient documentation

## 2020-03-24 DIAGNOSIS — Z79899 Other long term (current) drug therapy: Secondary | ICD-10-CM | POA: Diagnosis not present

## 2020-03-24 DIAGNOSIS — R197 Diarrhea, unspecified: Secondary | ICD-10-CM | POA: Insufficient documentation

## 2020-03-24 DIAGNOSIS — C187 Malignant neoplasm of sigmoid colon: Secondary | ICD-10-CM | POA: Diagnosis present

## 2020-03-24 DIAGNOSIS — F419 Anxiety disorder, unspecified: Secondary | ICD-10-CM | POA: Diagnosis not present

## 2020-03-24 DIAGNOSIS — Z5111 Encounter for antineoplastic chemotherapy: Secondary | ICD-10-CM | POA: Insufficient documentation

## 2020-03-24 DIAGNOSIS — C189 Malignant neoplasm of colon, unspecified: Secondary | ICD-10-CM

## 2020-03-24 DIAGNOSIS — C184 Malignant neoplasm of transverse colon: Secondary | ICD-10-CM

## 2020-03-24 LAB — CMP (CANCER CENTER ONLY)
ALT: 26 U/L (ref 0–44)
AST: 18 U/L (ref 15–41)
Albumin: 4.1 g/dL (ref 3.5–5.0)
Alkaline Phosphatase: 85 U/L (ref 38–126)
Anion gap: 8 (ref 5–15)
BUN: 17 mg/dL (ref 8–23)
CO2: 27 mmol/L (ref 22–32)
Calcium: 9.5 mg/dL (ref 8.9–10.3)
Chloride: 103 mmol/L (ref 98–111)
Creatinine: 1.06 mg/dL (ref 0.61–1.24)
GFR, Estimated: >60 mL/min (ref >60)
Glucose, Bld: 98 mg/dL (ref 70–99)
Potassium: 3.8 mmol/L (ref 3.5–5.1)
Sodium: 138 mmol/L (ref 135–145)
Total Bilirubin: 1.1 mg/dL (ref 0.3–1.2)
Total Protein: 6.4 g/dL — ABNORMAL LOW (ref 6.5–8.1)

## 2020-03-24 LAB — CBC WITH DIFFERENTIAL (CANCER CENTER ONLY)
Abs Immature Granulocytes: 0.01 10*3/uL (ref 0.00–0.07)
Basophils Absolute: 0 10*3/uL (ref 0.0–0.1)
Basophils Relative: 1 %
Eosinophils Absolute: 0 10*3/uL (ref 0.0–0.5)
Eosinophils Relative: 0 %
HCT: 36.5 % — ABNORMAL LOW (ref 39.0–52.0)
Hemoglobin: 11.9 g/dL — ABNORMAL LOW (ref 13.0–17.0)
Immature Granulocytes: 0 %
Lymphocytes Relative: 23 %
Lymphs Abs: 1.3 10*3/uL (ref 0.7–4.0)
MCH: 27.2 pg (ref 26.0–34.0)
MCHC: 32.6 g/dL (ref 30.0–36.0)
MCV: 83.3 fL (ref 80.0–100.0)
Monocytes Absolute: 0.6 10*3/uL (ref 0.1–1.0)
Monocytes Relative: 11 %
Neutro Abs: 3.6 10*3/uL (ref 1.7–7.7)
Neutrophils Relative %: 65 %
Platelet Count: 206 10*3/uL (ref 150–400)
RBC: 4.38 MIL/uL (ref 4.22–5.81)
RDW: 15.4 % (ref 11.5–15.5)
WBC Count: 5.5 10*3/uL (ref 4.0–10.5)
nRBC: 0 % (ref 0.0–0.2)

## 2020-03-24 MED ORDER — SODIUM CHLORIDE 0.9% FLUSH
10.0000 mL | INTRAVENOUS | Status: DC | PRN
  Filled 2020-03-24: qty 10

## 2020-03-24 MED ORDER — SODIUM CHLORIDE 0.9 % IV SOLN
10.0000 mg | Freq: Once | INTRAVENOUS | Status: AC
  Administered 2020-03-24: 10 mg via INTRAVENOUS
  Filled 2020-03-24: qty 10

## 2020-03-24 MED ORDER — DEXTROSE 5 % IV SOLN
1000.0000 mg | Freq: Once | INTRAVENOUS | Status: AC
  Administered 2020-03-24: 1000 mg via INTRAVENOUS
  Filled 2020-03-24: qty 50

## 2020-03-24 MED ORDER — DEXTROSE 5 % IV SOLN
Freq: Once | INTRAVENOUS | Status: AC
  Filled 2020-03-24: qty 250

## 2020-03-24 MED ORDER — PALONOSETRON HCL INJECTION 0.25 MG/5ML
0.2500 mg | Freq: Once | INTRAVENOUS | Status: AC
  Administered 2020-03-24: 0.25 mg via INTRAVENOUS

## 2020-03-24 MED ORDER — PALONOSETRON HCL INJECTION 0.25 MG/5ML
INTRAVENOUS | Status: AC
  Filled 2020-03-24: qty 5

## 2020-03-24 MED ORDER — OXALIPLATIN CHEMO INJECTION 100 MG/20ML
79.0000 mg/m2 | Freq: Once | INTRAVENOUS | Status: AC
  Administered 2020-03-24: 200 mg via INTRAVENOUS
  Filled 2020-03-24: qty 40

## 2020-03-24 MED ORDER — HEPARIN SOD (PORK) LOCK FLUSH 100 UNIT/ML IV SOLN
500.0000 [IU] | Freq: Once | INTRAVENOUS | Status: DC | PRN
  Filled 2020-03-24: qty 5

## 2020-03-24 MED ORDER — FLUOROURACIL CHEMO INJECTION 2.5 GM/50ML
400.0000 mg/m2 | Freq: Once | INTRAVENOUS | Status: AC
  Administered 2020-03-24: 1000 mg via INTRAVENOUS
  Filled 2020-03-24: qty 20

## 2020-03-24 MED ORDER — SODIUM CHLORIDE 0.9 % IV SOLN
2400.0000 mg/m2 | INTRAVENOUS | Status: DC
  Administered 2020-03-24: 6100 mg via INTRAVENOUS
  Filled 2020-03-24: qty 122

## 2020-03-24 NOTE — Progress Notes (Signed)
Pt discharged in no apparent distress. Pt left ambulatory without assistance. Pt aware of discharge instructions and verbalized understanding and had no further questions.  

## 2020-03-24 NOTE — Progress Notes (Signed)
Hematology and Oncology Follow Up Visit  John [name] [id number] [birth year] 65 y.o. 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  He actually tolerated his first cycle of treatment well.  He is doing nicely.  He is having some anxiety issues.  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  We will have to see about having him talk with somebody to try to help with any type of emotional issue that he has.  He had a little bit of diarrhea.  Hopefully, he will not have this.  This may have been associated with some stool softeners.  He has had no problems with fever.  There is no cough.  He has had no bleeding.  He has had no leg swelling.  He has had no problems with rashes.  He does have a little bit of cold sensitivity with his fingers.  Currently, his performance status is ECOG 0.    Medications:  Current Outpatient Medications:  .  dexamethasone (DECADRON) 4 MG tablet, Take 2 tablets (8 mg total) by mouth daily. Start the day after chemotherapy for 2 days. Take with food., Disp: 30 tablet, Rfl: 1 .  ondansetron (ZOFRAN) 8 MG tablet, Take 1 tablet (8 mg total) by mouth 2 (two) times daily as needed for refractory nausea / vomiting. Start on day 3 after chemotherapy., Disp: 30 tablet, Rfl: 1 .  prochlorperazine (COMPAZINE) 10 MG tablet, TAKE 1 TABLET(10 MG) BY MOUTH EVERY 6 HOURS AS NEEDED FOR NAUSEA OR VOMITING, Disp: 30 tablet, Rfl: 1 .  telmisartan (MICARDIS) 40 MG tablet, Take 40 mg by mouth daily., Disp: , Rfl:  .  lidocaine-prilocaine (EMLA) cream, Apply to affected area once (Patient not taking: No sig reported), Disp: 30 g, Rfl: 3 .  pantoprazole (PROTONIX) 40 MG tablet, Take 1 tablet (40 mg total) by mouth 2  (two) times daily., Disp: 90 tablet, Rfl: 3  Allergies:  Allergies  Allergen Reactions  . Erythromycin     Upset stomach   . Lisinopril Cough  . Other Swelling    Bee Sting     Past Medical History, Surgical history, Social history, and Family History were reviewed and updated.  Review of Systems: Review of Systems  Constitutional: Negative.   HENT:  Negative.   Eyes: Negative.   Respiratory: Negative.   Cardiovascular: Negative.   Gastrointestinal: Negative.   Endocrine: Negative.   Genitourinary: Negative.    Musculoskeletal: Negative.   Skin: Negative.   Neurological: Negative.   Hematological: Negative.   Psychiatric/Behavioral: Negative.     Physical Exam:  weight is 259 lb 8 oz (117.7 kg). His oral temperature is 98.4 F (36.9 C). His blood pressure is 162/80 (abnormal) and his pulse is 81. His respiration is 18 and oxygen saturation is 98%.   Wt Readings from Last 3 Encounters:  03/24/20 259 lb 8 oz (117.7 kg)  02/28/20 262 lb (118.8 kg)  01/31/20 260 lb (117.9 kg)    Physical Exam Vitals reviewed.  HENT:     Head: Normocephalic and atraumatic.  Eyes:     Pupils: Pupils are equal, round, and reactive to light.  Cardiovascular:     Rate and Rhythm: Normal rate and regular rhythm.  Heart sounds: Normal heart sounds.  Pulmonary:     Effort: Pulmonary effort is normal.     Breath sounds: Normal breath sounds.  Abdominal:     General: Bowel sounds are normal.     Palpations: Abdomen is soft.     Comments: Abdominal exam shows well-healed laparoscopy scars.  He has no abdominal distention.  He has good bowel sounds.  There is no fluid wave.  There is no palpable liver or spleen tip.  Musculoskeletal:        General: No tenderness or deformity. Normal range of motion.     Cervical back: Normal range of motion.  Lymphadenopathy:     Cervical: No cervical adenopathy.  Skin:    General: Skin is warm and dry.     Findings: No erythema or rash.   Neurological:     Mental Status: He is alert and oriented to person, place, and time.  Psychiatric:        Behavior: Behavior normal.        Thought Content: Thought content normal.        Judgment: Judgment normal.      Lab Results  Component Value Date   WBC 5.5 03/24/2020   HGB 11.9 (L) 03/24/2020   HCT 36.5 (L) 03/24/2020   MCV 83.3 03/24/2020   PLT 206 03/24/2020     Chemistry      Component Value Date/Time   NA 138 03/24/2020 1053   K 3.8 03/24/2020 1053   CL 103 03/24/2020 1053   CO2 27 03/24/2020 1053   BUN 17 03/24/2020 1053   CREATININE 1.06 03/24/2020 1053   CREATININE 1.14 12/04/2019 1310      Component Value Date/Time   CALCIUM 9.5 03/24/2020 1053   ALKPHOS 85 03/24/2020 1053   AST 18 03/24/2020 1053   ALT 26 03/24/2020 1053   BILITOT 1.1 03/24/2020 1053      Impression and Plan: John [name] is a a very nice 65 year old white male.  He has stage IIIb sigmoid colon cancer.  I think he is doing well.  However, we do want to try to make sure we address the emotional issues that he may be having.  I just hate for him to have anxiety or depression.  We will proceed with his second cycle of treatment.  Apparently, in April there is meeting that he wants to go to.  We will have to make an adjustment with the schedule to accommodate this.  We will get him back in 2 more weeks.     Volanda Napoleon, MD 2/8/202211:58 AM

## 2020-03-24 NOTE — Progress Notes (Signed)
Visited with patient after his appointment with MD. He states he is feeling anxious and depressed. He describes this as relatively minor, however he feels like maybe intervention may help prevent worsening of his feelings. He report when initially diagnosed with his renal cancer years ago, he suffered fairly severe depression for some time. He'd like to avoid that this time. His wife is present and supportive.  Reviewed options with patient. Made suggestion for him to start with our chaplain at [facility] as she has experience with cancer patients, and can provide some good support and offer coping strategies. Explained that if he didn't feel this worked, we could always refer him to an MD. He agreed to try speaking to the chaplain first.  Message sent to [employer]. She confirmed receipt of message and will reach out to the patient this week.   Oncology Nurse Navigator Documentation  Oncology Nurse Navigator Flowsheets 03/24/2020  Abnormal Finding Date -  Diagnosis Status -  Phase of Treatment -  Chemotherapy Actual Start Date: -  Expected Surgery Date -  Surgery Actual Start Date: -  Navigator Follow Up Date: 04/07/2020  Navigator Follow Up Reason: Follow-up Appointment;Chemotherapy  Navigator Location [facility]  Referral Date to RadOnc/MedOnc -  Navigator Encounter Type Follow-up Appt  Telephone -  Treatment Initiated Date -  Patient Visit Type MedOnc  Treatment Phase Active Tx  Barriers/Navigation Needs Coordination of Care;Education  Education Other  Interventions Coordination of Care;Education;Psycho-Social Support;Referrals  Acuity Level 2-Minimal Needs (1-2 Barriers Identified)  Referrals Spiritual care  Coordination of Care Other  Education Method Verbal  Support Groups/Services Friends and Family  Time Spent with Patient 20

## 2020-03-24 NOTE — Telephone Encounter (Signed)
appts made per 03/24/20 los, pt to view in tx/avs       John [name]

## 2020-03-24 NOTE — Patient Instructions (Signed)

## 2020-03-25 ENCOUNTER — Encounter: Payer: Self-pay | Admitting: General Practice

## 2020-03-25 LAB — CEA (IN HOUSE-CHCC): CEA (CHCC-In House): <1 ng/mL (ref 0.00–5.00)

## 2020-03-25 NOTE — Progress Notes (Signed)
[place] Spiritual Care Note  Referred by Karma Greaser at [facility] for emotional support as Mr [place] seeks listening and resources for helping guard against depression after recognizing it in himself belatedly after his first experience with cancer in 1997. Reached Mr Bagnell by phone, providing empathic listening, emotional support, and affirmation of strengths, including his self-awareness and long history with a meditation practice. We plan to follow up by Spiritual Care Virtual Visit on Friday 2/11 at [place], [place], [facility] Pager [phone] Voicemail [phone]

## 2020-03-26 ENCOUNTER — Inpatient Hospital Stay: Payer: Medicare Other

## 2020-03-26 ENCOUNTER — Other Ambulatory Visit: Payer: Self-pay

## 2020-03-26 VITALS — BP 150/63 | HR 76 | Temp 98.8°F | Resp 18

## 2020-03-26 DIAGNOSIS — C189 Malignant neoplasm of colon, unspecified: Secondary | ICD-10-CM

## 2020-03-26 DIAGNOSIS — Z5111 Encounter for antineoplastic chemotherapy: Secondary | ICD-10-CM | POA: Diagnosis not present

## 2020-03-26 MED ORDER — SODIUM CHLORIDE 0.9% FLUSH
10.0000 mL | INTRAVENOUS | Status: DC | PRN
Start: 2020-03-26 — End: 2020-03-26
  Administered 2020-03-26: 10 mL
  Filled 2020-03-26: qty 10

## 2020-03-26 MED ORDER — HEPARIN SOD (PORK) LOCK FLUSH 100 UNIT/ML IV SOLN
500.0000 [IU] | Freq: Once | INTRAVENOUS | Status: AC | PRN
  Administered 2020-03-26: 500 [IU]
  Filled 2020-03-26: qty 5

## 2020-03-27 ENCOUNTER — Encounter: Payer: Self-pay | Admitting: General Practice

## 2020-03-27 NOTE — Progress Notes (Signed)
[place] Spiritual Care Note  Had Spiritual Care Virtual Visit via Webex with John [name] so that he could process his feelings of creeping depression (no current interest in medication) and ways to rekindle his joy, including planting new peppers and preparing sauce with last season's fermented peppers this weekend, as well as starting back in his wood shop with a small, easier-to-complete project than some of the larger ones that he currently experiences as looming. He values having a conversation and accountability partner for reflection and sharing his updates, so we set up a follow-up virtual visit for Friday 2/18.   [place], [place], [facility] Pager [phone] Voicemail [phone]

## 2020-04-03 ENCOUNTER — Encounter: Payer: Self-pay | Admitting: General Practice

## 2020-04-03 NOTE — Progress Notes (Signed)
[place] Spiritual Care Note  Met with John [name] for Spiritual Care Virtual Visit via Webex for spiritual and emotional support. Served as a witness for John [name], affirming the insight and self-awareness with which he is monitoring his symptoms and energy levels in order to engage in life most sustainably during treatment. He sounds and reports feeling emotionally steadier/sturdier than at some points recently.  We talked about how setting aside certain goals (say, dangerous woodworking projects) until he feels strong and alert enough to complete them safely is a measure of his deep personal integrity, not a detraction from it. John [name] values writing as a spiritual discipline, so he plans to email written reflections intermittently in the coming weeks, and we may set up an in-person appointment for when he is in town in April.   [place], [place], [facility] Pager [phone] Voicemail [phone]

## 2020-04-07 ENCOUNTER — Inpatient Hospital Stay: Payer: Medicare Other

## 2020-04-07 ENCOUNTER — Encounter: Payer: Self-pay | Admitting: *Deleted

## 2020-04-07 ENCOUNTER — Other Ambulatory Visit: Payer: Self-pay

## 2020-04-07 ENCOUNTER — Encounter: Payer: Self-pay | Admitting: Hematology & Oncology

## 2020-04-07 ENCOUNTER — Inpatient Hospital Stay (HOSPITAL_BASED_OUTPATIENT_CLINIC_OR_DEPARTMENT_OTHER): Payer: Medicare Other | Admitting: Hematology & Oncology

## 2020-04-07 ENCOUNTER — Telehealth: Payer: Self-pay

## 2020-04-07 VITALS — BP 166/81 | HR 78 | Temp 97.9°F | Resp 18 | Wt 257.0 lb

## 2020-04-07 DIAGNOSIS — Z95828 Presence of other vascular implants and grafts: Secondary | ICD-10-CM

## 2020-04-07 DIAGNOSIS — C184 Malignant neoplasm of transverse colon: Secondary | ICD-10-CM

## 2020-04-07 DIAGNOSIS — Z5111 Encounter for antineoplastic chemotherapy: Secondary | ICD-10-CM | POA: Diagnosis not present

## 2020-04-07 DIAGNOSIS — C189 Malignant neoplasm of colon, unspecified: Secondary | ICD-10-CM

## 2020-04-07 LAB — CMP (CANCER CENTER ONLY)
ALT: 22 U/L (ref 0–44)
AST: 18 U/L (ref 15–41)
Albumin: 4.1 g/dL (ref 3.5–5.0)
Alkaline Phosphatase: 86 U/L (ref 38–126)
Anion gap: 9 (ref 5–15)
BUN: 16 mg/dL (ref 8–23)
CO2: 26 mmol/L (ref 22–32)
Calcium: 9.5 mg/dL (ref 8.9–10.3)
Chloride: 102 mmol/L (ref 98–111)
Creatinine: 1.09 mg/dL (ref 0.61–1.24)
GFR, Estimated: >60 mL/min (ref >60)
Glucose, Bld: 97 mg/dL (ref 70–99)
Potassium: 3.8 mmol/L (ref 3.5–5.1)
Sodium: 137 mmol/L (ref 135–145)
Total Bilirubin: 0.9 mg/dL (ref 0.3–1.2)
Total Protein: 6.7 g/dL (ref 6.5–8.1)

## 2020-04-07 LAB — CBC WITH DIFFERENTIAL (CANCER CENTER ONLY)
Abs Immature Granulocytes: 0.03 10*3/uL (ref 0.00–0.07)
Basophils Absolute: 0 10*3/uL (ref 0.0–0.1)
Basophils Relative: 1 %
Eosinophils Absolute: 0 10*3/uL (ref 0.0–0.5)
Eosinophils Relative: 0 %
HCT: 37.2 % — ABNORMAL LOW (ref 39.0–52.0)
Hemoglobin: 12.1 g/dL — ABNORMAL LOW (ref 13.0–17.0)
Immature Granulocytes: 1 %
Lymphocytes Relative: 28 %
Lymphs Abs: 1.4 10*3/uL (ref 0.7–4.0)
MCH: 27.1 pg (ref 26.0–34.0)
MCHC: 32.5 g/dL (ref 30.0–36.0)
MCV: 83.4 fL (ref 80.0–100.0)
Monocytes Absolute: 0.6 10*3/uL (ref 0.1–1.0)
Monocytes Relative: 13 %
Neutro Abs: 3 10*3/uL (ref 1.7–7.7)
Neutrophils Relative %: 57 %
Platelet Count: 172 10*3/uL (ref 150–400)
RBC: 4.46 MIL/uL (ref 4.22–5.81)
RDW: 15.1 % (ref 11.5–15.5)
WBC Count: 5.1 10*3/uL (ref 4.0–10.5)
nRBC: 0 % (ref 0.0–0.2)

## 2020-04-07 MED ORDER — SODIUM CHLORIDE 0.9% FLUSH
10.0000 mL | Freq: Once | INTRAVENOUS | Status: AC
  Administered 2020-04-07: 10 mL via INTRAVENOUS
  Filled 2020-04-07: qty 10

## 2020-04-07 MED ORDER — SODIUM CHLORIDE 0.9 % IV SOLN
10.0000 mg | Freq: Once | INTRAVENOUS | Status: AC
  Administered 2020-04-07: 10 mg via INTRAVENOUS
  Filled 2020-04-07: qty 10

## 2020-04-07 MED ORDER — SODIUM CHLORIDE 0.9 % IV SOLN
2400.0000 mg/m2 | INTRAVENOUS | Status: DC
  Administered 2020-04-07: 6100 mg via INTRAVENOUS
  Filled 2020-04-07: qty 122

## 2020-04-07 MED ORDER — LEUCOVORIN CALCIUM INJECTION 350 MG
1000.0000 mg | Freq: Once | INTRAVENOUS | Status: AC
  Administered 2020-04-07: 1000 mg via INTRAVENOUS
  Filled 2020-04-07: qty 50

## 2020-04-07 MED ORDER — PALONOSETRON HCL INJECTION 0.25 MG/5ML
INTRAVENOUS | Status: AC
  Filled 2020-04-07: qty 5

## 2020-04-07 MED ORDER — OXALIPLATIN CHEMO INJECTION 100 MG/20ML
79.0000 mg/m2 | Freq: Once | INTRAVENOUS | Status: AC
  Administered 2020-04-07: 200 mg via INTRAVENOUS
  Filled 2020-04-07: qty 40

## 2020-04-07 MED ORDER — FLUOROURACIL CHEMO INJECTION 2.5 GM/50ML
400.0000 mg/m2 | Freq: Once | INTRAVENOUS | Status: AC
  Administered 2020-04-07: 1000 mg via INTRAVENOUS
  Filled 2020-04-07: qty 20

## 2020-04-07 MED ORDER — DEXTROSE 5 % IV SOLN
Freq: Once | INTRAVENOUS | Status: AC
  Filled 2020-04-07: qty 250

## 2020-04-07 MED ORDER — PALONOSETRON HCL INJECTION 0.25 MG/5ML
0.2500 mg | Freq: Once | INTRAVENOUS | Status: AC
  Administered 2020-04-07: 0.25 mg via INTRAVENOUS

## 2020-04-07 NOTE — Patient Instructions (Signed)
[place] Discharge Instructions for Patients Receiving Chemotherapy  Today you received the following chemotherapy agents Oxaliplatin/Leucovorin/5 Fu To help prevent nausea and vomiting after your treatment, we encourage you to take your nausea medication as prescribed.    If you develop nausea and vomiting that is not controlled by your nausea medication, call the clinic.   BELOW ARE SYMPTOMS THAT SHOULD BE REPORTED IMMEDIATELY:  *FEVER GREATER THAN 100.5 F  *CHILLS WITH OR WITHOUT FEVER  NAUSEA AND VOMITING THAT IS NOT CONTROLLED WITH YOUR NAUSEA MEDICATION  *UNUSUAL SHORTNESS OF BREATH  *UNUSUAL BRUISING OR BLEEDING  TENDERNESS IN MOUTH AND THROAT WITH OR WITHOUT PRESENCE OF ULCERS  *URINARY PROBLEMS  *BOWEL PROBLEMS  UNUSUAL RASH Items with * indicate a potential emergency and should be followed up as soon as possible.  Feel free to call the clinic should you have any questions or concerns. The clinic phone number is [phone].  Please show the [place] at check-in to the Emergency Department and triage nurse.

## 2020-04-07 NOTE — Progress Notes (Signed)
Patient is doing better. He has spoke to our Chaplain a few times and states that this has been helpful. He is hoping to have an in person visit with her soon.  Oncology Nurse Navigator Documentation  Oncology Nurse Navigator Flowsheets 04/07/2020  Abnormal Finding Date -  Diagnosis Status -  Phase of Treatment -  Chemotherapy Actual Start Date: -  Expected Surgery Date -  Surgery Actual Start Date: -  Navigator Follow Up Date: 04/21/2020  Navigator Follow Up Reason: Follow-up Appointment;Chemotherapy  Navigator Location [facility]  Referral Date to RadOnc/MedOnc -  Navigator Encounter Type Treatment  Telephone -  Treatment Initiated Date -  Patient Visit Type MedOnc  Treatment Phase Active Tx  Barriers/Navigation Needs Coordination of Care;Education  Education -  Interventions Psycho-Social Support  Acuity Level 2-Minimal Needs (1-2 Barriers Identified)  Referrals -  Coordination of Care -  Education Method -  Support Groups/Services Friends and Family  Time Spent with Patient 30

## 2020-04-07 NOTE — Telephone Encounter (Signed)
appts to be made per los are already in place/sch,  Will add 05/05/20   John [name]

## 2020-04-07 NOTE — Progress Notes (Signed)
Hematology and Oncology Follow Up Visit  Ezel Vallone [id number] Dec 20, 1955 65 y.o. 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 Dani is back for follow-up.  He is doing really well.  He has tolerated treatment quite nicely.  Is been no problems with nausea or vomiting.  He does have little bit of sinus drainage when he has treatment.  I told him that he could certainly try some Claritin or Zyrtec.  There is been no problems with bleeding.  He has had no cough or shortness of breath.  There has been no leg swelling.  He has had no rashes.  Overall, his performance status is ECOG 0.    Medications:  Current Outpatient Medications:  .  dexamethasone (DECADRON) 4 MG tablet, Take 2 tablets (8 mg total) by mouth daily. Start the day after chemotherapy for 2 days. Take with food., Disp: 30 tablet, Rfl: 1 .  ondansetron (ZOFRAN) 8 MG tablet, Take 1 tablet (8 mg total) by mouth 2 (two) times daily as needed for refractory nausea / vomiting. Start on day 3 after chemotherapy., Disp: 30 tablet, Rfl: 1 .  prochlorperazine (COMPAZINE) 10 MG tablet, TAKE 1 TABLET(10 MG) BY MOUTH EVERY 6 HOURS AS NEEDED FOR NAUSEA OR VOMITING, Disp: 30 tablet, Rfl: 1 .  telmisartan (MICARDIS) 40 MG tablet, Take 40 mg by mouth daily., Disp: , Rfl:  .  lidocaine-prilocaine (EMLA) cream, Apply to affected area once (Patient not taking: No sig reported), Disp: 30 g, Rfl: 3 .  pantoprazole (PROTONIX) 40 MG tablet, Take 1 tablet (40 mg total) by mouth 2 (two) times daily., Disp: 90 tablet, Rfl: 3 No current facility-administered medications for this visit.  Facility-Administered Medications Ordered in Other Visits:  .  fluorouracil (ADRUCIL) 6,100 mg in sodium chloride 0.9 % 128 mL chemo  infusion, 2,400 mg/m2 (Treatment Plan Recorded), Intravenous, 1 day or 1 dose, Ziah Turvey, Rudell Cobb, MD .  fluorouracil (ADRUCIL) chemo injection 1,000 mg, 400 mg/m2 (Treatment Plan Recorded), Intravenous, Once, Rishard Delange R, MD .  leucovorin 1,000 mg in dextrose 5 % 250 mL infusion, 1,000 mg, Intravenous, Once, Eliyana Pagliaro, Rudell Cobb, MD .  oxaliplatin (ELOXATIN) 200 mg in dextrose 5 % 500 mL chemo infusion, 79 mg/m2 (Treatment Plan Recorded), Intravenous, Once, Reeves Musick, Rudell Cobb, MD  Allergies:  Allergies  Allergen Reactions  . Erythromycin     Upset stomach   . Lisinopril Cough  . Other Swelling    Bee Sting     Past Medical History, Surgical history, Social history, and Family History were reviewed and updated.  Review of Systems: Review of Systems  Constitutional: Negative.   HENT:  Negative.   Eyes: Negative.   Respiratory: Negative.   Cardiovascular: Negative.   Gastrointestinal: Negative.   Endocrine: Negative.   Genitourinary: Negative.    Musculoskeletal: Negative.   Skin: Negative.   Neurological: Negative.   Hematological: Negative.   Psychiatric/Behavioral: Negative.     Physical Exam:  weight is 257 lb (116.6 kg). His oral temperature is 97.9 F (36.6 C). His blood pressure is 166/81 (abnormal) and his pulse is 78. His respiration is 18 and oxygen saturation is 98%.   Wt Readings from Last 3 Encounters:  04/07/20 257 lb (116.6 kg)  03/24/20 259 lb 8 oz (117.7 kg)  02/28/20 262 lb (118.8 kg)    Physical Exam Vitals reviewed.  HENT:     Head: Normocephalic and atraumatic.  Eyes:     Pupils: Pupils are equal, round, and reactive to light.  Cardiovascular:     Rate and Rhythm: Normal rate and regular rhythm.     Heart sounds: Normal heart sounds.  Pulmonary:     Effort: Pulmonary effort is normal.     Breath sounds: Normal breath sounds.  Abdominal:     General: Bowel sounds are normal.     Palpations: Abdomen is soft.     Comments: Abdominal exam shows  well-healed laparoscopy scars.  He has no abdominal distention.  He has good bowel sounds.  There is no fluid wave.  There is no palpable liver or spleen tip.  Musculoskeletal:        General: No tenderness or deformity. Normal range of motion.     Cervical back: Normal range of motion.  Lymphadenopathy:     Cervical: No cervical adenopathy.  Skin:    General: Skin is warm and dry.     Findings: No erythema or rash.  Neurological:     Mental Status: He is alert and oriented to person, place, and time.  Psychiatric:        Behavior: Behavior normal.        Thought Content: Thought content normal.        Judgment: Judgment normal.      Lab Results  Component Value Date   WBC 5.1 04/07/2020   HGB 12.1 (L) 04/07/2020   HCT 37.2 (L) 04/07/2020   MCV 83.4 04/07/2020   PLT 172 04/07/2020     Chemistry      Component Value Date/Time   NA 137 04/07/2020 1150   K 3.8 04/07/2020 1150   CL 102 04/07/2020 1150   CO2 26 04/07/2020 1150   BUN 16 04/07/2020 1150   CREATININE 1.09 04/07/2020 1150   CREATININE 1.14 12/04/2019 1310      Component Value Date/Time   CALCIUM 9.5 04/07/2020 1150   ALKPHOS 86 04/07/2020 1150   AST 18 04/07/2020 1150   ALT 22 04/07/2020 1150   BILITOT 0.9 04/07/2020 1150      Impression and Plan: Mr. Epps is a a very nice 65 year old white male.  He has stage IIIb sigmoid colon cancer.  I think he is doing well.  We will go ahead with his third cycle of treatment.  After the fourth cycle of treatment, we will then do a CT scan to see how everything looks.  He certainly seems to be doing better with respect to the anxiety and occasional depression that he gets.  I am just happy to see that his quality of life is doing so well.  We will get him back in 2 more weeks.     Volanda Napoleon, MD 2/22/20221:37 PM

## 2020-04-07 NOTE — Patient Instructions (Signed)
Tunneled Central Venous Catheter Flushing Guide  It is important to flush your tunneled central venous catheter each time you use it, both before and after you use it. Flushing your catheter will help prevent it from clogging. What are the risks? Risks may include:  Infection.  Air getting into the catheter and bloodstream. Supplies needed:  A clean pair of gloves.  A disinfecting wipe. Use an alcohol wipe, chlorhexidine wipe, or iodine wipe as told by your health care provider.  A 10 mL syringe that has been prefilled with saline solution.  An empty 10 mL syringe, if a substance called heparin was injected into your catheter. How to flush your catheter When you flush your catheter, make sure you follow any specific instructions from your health care provider or the manufacturer. These are general guidelines. Flushing your catheter before use If there is heparin in your catheter: 1. Wash your hands with soap and water. 2. Put on gloves. 3. Scrub the injection cap for a minimum of 15 seconds with a disinfecting wipe. 4. Unclamp the catheter. 5. Attach the empty syringe to the injection cap. 6. Pull the syringe plunger back and withdraw 10 mL of blood. 7. Place the syringe into an appropriate waste container. 8. Scrub the injection cap for 15 seconds with a disinfecting wipe. 9. Attach the prefilled syringe to the injection cap. 10. Flush the catheter by pushing the plunger forward until all the liquid from the syringe is in the catheter. 11. Remove the syringe from the injection cap. 12. Clamp the catheter. If there is no heparin in your catheter: 1. Wash your hands with soap and water. 2. Put on gloves. 3. Scrub the injection cap for 15 seconds with a disinfecting wipe. 4. Unclamp the catheter. 5. Attach the prefilled syringe to the injection cap. 6. Flush the catheter by pushing the plunger forward until 5 mL of the liquid from the syringe is in the catheter. 7. Pull back on  the syringe until you see blood in the catheter. 8. If you have been asked to collect any blood, follow your health care provider's instructions. Otherwise, flush the catheter with the rest of the solution from the syringe. 9. Remove the syringe from the injection cap. 10. Clamp the catheter.   Flushing your catheter after use 1. Wash your hands with soap and water. 2. Put on gloves. 3. Scrub the injection cap for 15 seconds with a disinfecting wipe. 4. Unclamp the catheter. 5. Attach the prefilled syringe to the injection cap. 6. Flush the catheter by pushing the plunger forward until all of the liquid from the syringe is in the catheter. 7. Remove the syringe from the injection cap. 8. Clamp the catheter. Problems and solutions  If blood cannot be completely cleared from the injection cap, you may need to have the injection cap replaced.  If the catheter is difficult to flush, use the pulsing method. The pulsing method involves pushing only a few milliliters of solution into the catheter at a time and pausing between pushes.  If you do not see blood in the catheter when you pull back on the syringe, change your body position, such as by raising your arms above your head. Take a deep breath and cough. Then, pull back on the syringe. If you still do not see blood, flush the catheter with a small amount of solution. Then, change positions again and take a breath or cough. Pull back on the syringe again. If you still do not   see blood, finish flushing the catheter and contact your health care provider. Do not use your catheter until your health care provider says it is okay. General tips  Have someone help you flush your catheter, if possible.  Do not force fluid through your catheter.  Do not use a syringe that is larger or smaller than 10 mL. Using a smaller syringe can make the catheter burst.  Do not use your catheter without flushing it first if it has heparin in it. Contact a health  care provider if:  You cannot see any blood in the catheter when you flush it before using it.  Your catheter is difficult to flush. Get help right away if:  You cannot flush the catheter.  The catheter leaks when you flush it or when there is fluid in it.  There are cracks or breaks in the catheter. Summary  It is important to flush your tunneled central venous catheter each time you use it, both before and after you use it.  Scrub the injection cap for 15 seconds with a disinfecting wipe before and after you flush it.  When you flush your catheter, make sure you follow any specific instructions from your health care provider or the manufacturer.  Get help right away if you cannot flush the catheter. This information is not intended to replace advice given to you by your health care provider. Make sure you discuss any questions you have with your health care provider. Document Revised: 04/11/2019 Document Reviewed: 04/18/2018 Elsevier Patient Education  2021 Elsevier Inc.  

## 2020-04-08 LAB — IRON AND TIBC
Iron: 61 ug/dL (ref 42–163)
Saturation Ratios: 15 % — ABNORMAL LOW (ref 20–55)
TIBC: 411 ug/dL — ABNORMAL HIGH (ref 202–409)
UIBC: 351 ug/dL (ref 117–376)

## 2020-04-08 LAB — FERRITIN: Ferritin: 68 ng/mL (ref 24–336)

## 2020-04-09 ENCOUNTER — Inpatient Hospital Stay: Payer: Medicare Other

## 2020-04-09 ENCOUNTER — Other Ambulatory Visit: Payer: Self-pay | Admitting: Family

## 2020-04-09 ENCOUNTER — Other Ambulatory Visit: Payer: Self-pay

## 2020-04-09 VITALS — BP 155/71 | HR 68 | Temp 98.0°F | Resp 18

## 2020-04-09 DIAGNOSIS — C189 Malignant neoplasm of colon, unspecified: Secondary | ICD-10-CM

## 2020-04-09 DIAGNOSIS — Z5111 Encounter for antineoplastic chemotherapy: Secondary | ICD-10-CM | POA: Diagnosis not present

## 2020-04-09 DIAGNOSIS — D500 Iron deficiency anemia secondary to blood loss (chronic): Secondary | ICD-10-CM

## 2020-04-09 DIAGNOSIS — K922 Gastrointestinal hemorrhage, unspecified: Secondary | ICD-10-CM

## 2020-04-09 MED ORDER — SODIUM CHLORIDE 0.9 % IV SOLN
510.0000 mg | Freq: Once | INTRAVENOUS | Status: AC
  Administered 2020-04-09: 510 mg via INTRAVENOUS
  Filled 2020-04-09: qty 17

## 2020-04-09 MED ORDER — SODIUM CHLORIDE 0.9 % IV SOLN
Freq: Once | INTRAVENOUS | Status: AC
  Filled 2020-04-09: qty 250

## 2020-04-09 MED ORDER — SODIUM CHLORIDE 0.9% FLUSH
10.0000 mL | INTRAVENOUS | Status: DC | PRN
  Administered 2020-04-09: 10 mL
  Filled 2020-04-09: qty 10

## 2020-04-09 MED ORDER — HEPARIN SOD (PORK) LOCK FLUSH 100 UNIT/ML IV SOLN
500.0000 [IU] | Freq: Once | INTRAVENOUS | Status: AC | PRN
  Administered 2020-04-09: 500 [IU]
  Filled 2020-04-09: qty 5

## 2020-04-09 NOTE — Patient Instructions (Signed)
Ferumoxytol injection What is this medicine? FERUMOXYTOL is an iron complex. Iron is used to make healthy red blood cells, which carry oxygen and nutrients throughout the body. This medicine is used to treat iron deficiency anemia. This medicine may be used for other purposes; ask your health care provider or pharmacist if you have questions. COMMON BRAND NAME(S): Feraheme What should I tell my health care provider before I take this medicine? They need to know if you have any of these conditions:  anemia not caused by low iron levels  high levels of iron in the blood  magnetic resonance imaging (MRI) test scheduled  an unusual or allergic reaction to iron, other medicines, foods, dyes, or preservatives  pregnant or trying to get pregnant  breast-feeding How should I use this medicine? This medicine is for injection into a vein. It is given by a health care professional in a hospital or clinic setting. Talk to your pediatrician regarding the use of this medicine in children. Special care may be needed. Overdosage: If you think you have taken too much of this medicine contact a poison control center or emergency room at once. NOTE: This medicine is only for you. Do not share this medicine with others. What if I miss a dose? It is important not to miss your dose. Call your doctor or health care professional if you are unable to keep an appointment. What may interact with this medicine? This medicine may interact with the following medications:  other iron products This list may not describe all possible interactions. Give your health care provider a list of all the medicines, herbs, non-prescription drugs, or dietary supplements you use. Also tell them if you smoke, drink alcohol, or use illegal drugs. Some items may interact with your medicine. What should I watch for while using this medicine? Visit your doctor or healthcare professional regularly. Tell your doctor or healthcare  professional if your symptoms do not start to get better or if they get worse. You may need blood work done while you are taking this medicine. You may need to follow a special diet. Talk to your doctor. Foods that contain iron include: whole grains/cereals, dried fruits, beans, or peas, leafy green vegetables, and organ meats (liver, kidney). What side effects may I notice from receiving this medicine? Side effects that you should report to your doctor or health care professional as soon as possible:  allergic reactions like skin rash, itching or hives, swelling of the face, lips, or tongue  breathing problems  changes in blood pressure  feeling faint or lightheaded, falls  fever or chills  flushing, sweating, or hot feelings  swelling of the ankles or feet Side effects that usually do not require medical attention (report to your doctor or health care professional if they continue or are bothersome):  diarrhea  headache  nausea, vomiting  stomach pain This list may not describe all possible side effects. Call your doctor for medical advice about side effects. You may report side effects to FDA at 1-800-FDA-1088. Where should I keep my medicine? This drug is given in a hospital or clinic and will not be stored at home. NOTE: This sheet is a summary. It may not cover all possible information. If you have questions about this medicine, talk to your doctor, pharmacist, or health care provider.  2021 Elsevier/Gold Standard (2016-03-21 20:21:10)  

## 2020-04-09 NOTE — Addendum Note (Signed)
Addended by: Volanda Napoleon on: 04/09/2020 02:14 PM   Modules accepted: Orders

## 2020-04-21 ENCOUNTER — Inpatient Hospital Stay: Payer: Medicare Other | Attending: Hematology & Oncology

## 2020-04-21 ENCOUNTER — Inpatient Hospital Stay: Payer: Medicare Other

## 2020-04-21 ENCOUNTER — Encounter: Payer: Self-pay | Admitting: *Deleted

## 2020-04-21 ENCOUNTER — Encounter: Payer: Self-pay | Admitting: Hematology & Oncology

## 2020-04-21 ENCOUNTER — Telehealth: Payer: Self-pay

## 2020-04-21 ENCOUNTER — Other Ambulatory Visit: Payer: Self-pay

## 2020-04-21 ENCOUNTER — Inpatient Hospital Stay (HOSPITAL_BASED_OUTPATIENT_CLINIC_OR_DEPARTMENT_OTHER): Payer: Medicare Other | Admitting: Hematology & Oncology

## 2020-04-21 VITALS — BP 157/74 | HR 72 | Temp 98.7°F | Resp 18 | Wt 256.8 lb

## 2020-04-21 DIAGNOSIS — C185 Malignant neoplasm of splenic flexure: Secondary | ICD-10-CM | POA: Diagnosis not present

## 2020-04-21 DIAGNOSIS — C184 Malignant neoplasm of transverse colon: Secondary | ICD-10-CM

## 2020-04-21 DIAGNOSIS — Z5111 Encounter for antineoplastic chemotherapy: Secondary | ICD-10-CM | POA: Diagnosis not present

## 2020-04-21 DIAGNOSIS — C189 Malignant neoplasm of colon, unspecified: Secondary | ICD-10-CM

## 2020-04-21 DIAGNOSIS — C187 Malignant neoplasm of sigmoid colon: Secondary | ICD-10-CM | POA: Insufficient documentation

## 2020-04-21 DIAGNOSIS — Z79899 Other long term (current) drug therapy: Secondary | ICD-10-CM | POA: Diagnosis not present

## 2020-04-21 LAB — CBC WITH DIFFERENTIAL (CANCER CENTER ONLY)
Abs Immature Granulocytes: 0.03 10*3/uL (ref 0.00–0.07)
Basophils Absolute: 0.1 10*3/uL (ref 0.0–0.1)
Basophils Relative: 1 %
Eosinophils Absolute: 0 10*3/uL (ref 0.0–0.5)
Eosinophils Relative: 1 %
HCT: 37.4 % — ABNORMAL LOW (ref 39.0–52.0)
Hemoglobin: 12.2 g/dL — ABNORMAL LOW (ref 13.0–17.0)
Immature Granulocytes: 1 %
Lymphocytes Relative: 27 %
Lymphs Abs: 1.5 10*3/uL (ref 0.7–4.0)
MCH: 27.6 pg (ref 26.0–34.0)
MCHC: 32.6 g/dL (ref 30.0–36.0)
MCV: 84.6 fL (ref 80.0–100.0)
Monocytes Absolute: 0.6 10*3/uL (ref 0.1–1.0)
Monocytes Relative: 12 %
Neutro Abs: 3.2 10*3/uL (ref 1.7–7.7)
Neutrophils Relative %: 58 %
Platelet Count: 119 10*3/uL — ABNORMAL LOW (ref 150–400)
RBC: 4.42 MIL/uL (ref 4.22–5.81)
RDW: 16.9 % — ABNORMAL HIGH (ref 11.5–15.5)
WBC Count: 5.4 10*3/uL (ref 4.0–10.5)
nRBC: 0 % (ref 0.0–0.2)

## 2020-04-21 LAB — CMP (CANCER CENTER ONLY)
ALT: 23 U/L (ref 0–44)
AST: 22 U/L (ref 15–41)
Albumin: 3.9 g/dL (ref 3.5–5.0)
Alkaline Phosphatase: 81 U/L (ref 38–126)
Anion gap: 7 (ref 5–15)
BUN: 11 mg/dL (ref 8–23)
CO2: 27 mmol/L (ref 22–32)
Calcium: 9.3 mg/dL (ref 8.9–10.3)
Chloride: 101 mmol/L (ref 98–111)
Creatinine: 1.05 mg/dL (ref 0.61–1.24)
GFR, Estimated: >60 mL/min (ref >60)
Glucose, Bld: 92 mg/dL (ref 70–99)
Potassium: 3.6 mmol/L (ref 3.5–5.1)
Sodium: 135 mmol/L (ref 135–145)
Total Bilirubin: 0.6 mg/dL (ref 0.3–1.2)
Total Protein: 6.3 g/dL — ABNORMAL LOW (ref 6.5–8.1)

## 2020-04-21 MED ORDER — DEXTROSE 5 % IV SOLN
Freq: Once | INTRAVENOUS | Status: AC
Start: 2020-04-21 — End: 2020-04-21
  Filled 2020-04-21: qty 250

## 2020-04-21 MED ORDER — FLUOROURACIL CHEMO INJECTION 2.5 GM/50ML
400.0000 mg/m2 | Freq: Once | INTRAVENOUS | Status: AC
  Administered 2020-04-21: 1000 mg via INTRAVENOUS
  Filled 2020-04-21: qty 20

## 2020-04-21 MED ORDER — HEPARIN SOD (PORK) LOCK FLUSH 100 UNIT/ML IV SOLN
500.0000 [IU] | Freq: Once | INTRAVENOUS | Status: DC | PRN
  Filled 2020-04-21: qty 5

## 2020-04-21 MED ORDER — LEUCOVORIN CALCIUM INJECTION 350 MG
1000.0000 mg | Freq: Once | INTRAVENOUS | Status: AC
  Administered 2020-04-21: 1000 mg via INTRAVENOUS
  Filled 2020-04-21: qty 50

## 2020-04-21 MED ORDER — PALONOSETRON HCL INJECTION 0.25 MG/5ML
INTRAVENOUS | Status: AC
  Filled 2020-04-21: qty 5

## 2020-04-21 MED ORDER — DEXTROSE 5 % IV SOLN
Freq: Once | INTRAVENOUS | Status: AC
  Filled 2020-04-21: qty 250

## 2020-04-21 MED ORDER — DEXTROSE 5 % IV SOLN
79.0000 mg/m2 | Freq: Once | INTRAVENOUS | Status: AC
  Administered 2020-04-21: 200 mg via INTRAVENOUS
  Filled 2020-04-21: qty 40

## 2020-04-21 MED ORDER — SODIUM CHLORIDE 0.9% FLUSH
10.0000 mL | INTRAVENOUS | Status: DC | PRN
  Filled 2020-04-21: qty 10

## 2020-04-21 MED ORDER — PALONOSETRON HCL INJECTION 0.25 MG/5ML
0.2500 mg | Freq: Once | INTRAVENOUS | Status: AC
  Administered 2020-04-21: 0.25 mg via INTRAVENOUS

## 2020-04-21 MED ORDER — SODIUM CHLORIDE 0.9 % IV SOLN
2400.0000 mg/m2 | INTRAVENOUS | Status: DC
  Administered 2020-04-21: 6100 mg via INTRAVENOUS
  Filled 2020-04-21: qty 122

## 2020-04-21 MED ORDER — SODIUM CHLORIDE 0.9 % IV SOLN
10.0000 mg | Freq: Once | INTRAVENOUS | Status: AC
  Administered 2020-04-21: 10 mg via INTRAVENOUS
  Filled 2020-04-21: qty 10

## 2020-04-21 NOTE — Patient Instructions (Signed)
[facility] Discharge Instructions for Patients Receiving Chemotherapy  Today you received the following chemotherapy agents Oxaliplatin/Leucovorin/5 Fu To help prevent nausea and vomiting after your treatment, we encourage you to take your nausea medication as prescribed.    If you develop nausea and vomiting that is not controlled by your nausea medication, call the clinic.   BELOW ARE SYMPTOMS THAT SHOULD BE REPORTED IMMEDIATELY:  *FEVER GREATER THAN 100.5 F  *CHILLS WITH OR WITHOUT FEVER  NAUSEA AND VOMITING THAT IS NOT CONTROLLED WITH YOUR NAUSEA MEDICATION  *UNUSUAL SHORTNESS OF BREATH  *UNUSUAL BRUISING OR BLEEDING  TENDERNESS IN MOUTH AND THROAT WITH OR WITHOUT PRESENCE OF ULCERS  *URINARY PROBLEMS  *BOWEL PROBLEMS  UNUSUAL RASH Items with * indicate a potential emergency and should be followed up as soon as possible.  Feel free to call the clinic should you have any questions or concerns. The clinic phone number is [phone].  Please show the [place] at check-in to the Emergency Department and triage nurse.

## 2020-04-21 NOTE — Telephone Encounter (Signed)
04/21/20 los appts already in place, ct order noted and will be sch once Josem Kaufmann is in place   [place]

## 2020-04-21 NOTE — Patient Instructions (Signed)
Implanted Port Insertion, Care After This sheet gives you information about how to care for yourself after your procedure. Your health care provider may also give you more specific instructions. If you have problems or questions, contact your health care provider. What can I expect after the procedure? After the procedure, it is common to have:  Discomfort at the port insertion site.  Bruising on the skin over the port. This should improve over 3-4 days. Follow these instructions at home: Port care  After your port is placed, you will get a manufacturer's information card. The card has information about your port. Keep this card with you at all times.  Take care of the port as told by your health care provider. Ask your health care provider if you or a family member can get training for taking care of the port at home. A home health care nurse may also take care of the port.  Make sure to remember what type of port you have. Incision care  Follow instructions from your health care provider about how to take care of your port insertion site. Make sure you: ? Wash your hands with soap and water before and after you change your bandage (dressing). If soap and water are not available, use hand sanitizer. ? Change your dressing as told by your health care provider. ? Leave stitches (sutures), skin glue, or adhesive strips in place. These skin closures may need to stay in place for 2 weeks or longer. If adhesive strip edges start to loosen and curl up, you may trim the loose edges. Do not remove adhesive strips completely unless your health care provider tells you to do that.  Check your port insertion site every day for signs of infection. Check for: ? Redness, swelling, or pain. ? Fluid or blood. ? Warmth. ? Pus or a bad smell.      Activity  Return to your normal activities as told by your health care provider. Ask your health care provider what activities are safe for you.  Do not  lift anything that is heavier than 10 lb (4.5 kg), or the limit that you are told, until your health care provider says that it is safe. General instructions  Take over-the-counter and prescription medicines only as told by your health care provider.  Do not take baths, swim, or use a hot tub until your health care provider approves. Ask your health care provider if you may take showers. You may only be allowed to take sponge baths.  Do not drive for 24 hours if you were given a sedative during your procedure.  Wear a medical alert bracelet in case of an emergency. This will tell any health care providers that you have a port.  Keep all follow-up visits as told by your health care provider. This is important. Contact a health care provider if:  You cannot flush your port with saline as directed, or you cannot draw blood from the port.  You have a fever or chills.  You have redness, swelling, or pain around your port insertion site.  You have fluid or blood coming from your port insertion site.  Your port insertion site feels warm to the touch.  You have pus or a bad smell coming from the port insertion site. Get help right away if:  You have chest pain or shortness of breath.  You have bleeding from your port that you cannot control. Summary  Take care of the port as told by your   health care provider. Keep the manufacturer's information card with you at all times.  Change your dressing as told by your health care provider.  Contact a health care provider if you have a fever or chills or if you have redness, swelling, or pain around your port insertion site.  Keep all follow-up visits as told by your health care provider. This information is not intended to replace advice given to you by your health care provider. Make sure you discuss any questions you have with your health care provider. Document Revised: 08/29/2017 Document Reviewed: 08/29/2017 Elsevier Patient Education   2021 Elsevier Inc.  

## 2020-04-21 NOTE — Progress Notes (Signed)
Hematology and Oncology Follow Up Visit  John [name] [id number] 30-Sep-1955 65 y.o. 04/21/2020   Principle Diagnosis:   Stage IIIb (T3N1M0) moderately differentiated adenocarcinoma of the sigmoid colon  Current Therapy:    Status post resection on 01/28/2020  Adjuvant chemotherapy with FOLFOX-s/p cycle #3 --start on 03/12/2020     Interim History:  John [name] is back for follow-up.  He is doing really well.  He has tolerated treatment quite nicely.  He does have a little bit of a sore on his hard palate.  I told him to rinse his mouth out with water and baking soda 6 times a day.  He really has had no issues with nausea or vomiting.  He does not have as much problem with his sinuses.   There has been no bleeding.  He has had no problems with diarrhea.  There has not been any problems with leg swelling or rashes.  Overall, his performance status is ECOG 0.    Medications:  Current Outpatient Medications:  .  cetirizine (ZYRTEC) 10 MG chewable tablet, Chew 10 mg by mouth daily., Disp: , Rfl:  .  dexamethasone (DECADRON) 4 MG tablet, Take 2 tablets (8 mg total) by mouth daily. Start the day after chemotherapy for 2 days. Take with food., Disp: 30 tablet, Rfl: 1 .  ondansetron (ZOFRAN) 8 MG tablet, Take 1 tablet (8 mg total) by mouth 2 (two) times daily as needed for refractory nausea / vomiting. Start on day 3 after chemotherapy., Disp: 30 tablet, Rfl: 1 .  prochlorperazine (COMPAZINE) 10 MG tablet, TAKE 1 TABLET(10 MG) BY MOUTH EVERY 6 HOURS AS NEEDED FOR NAUSEA OR VOMITING, Disp: 30 tablet, Rfl: 1 .  telmisartan (MICARDIS) 40 MG tablet, Take 40 mg by mouth daily., Disp: , Rfl:  .  lidocaine-prilocaine (EMLA) cream, Apply to affected area once (Patient not taking: No sig reported), Disp: 30 g, Rfl: 3 .  pantoprazole (PROTONIX) 40 MG tablet, Take 1 tablet (40 mg total) by mouth 2 (two) times daily., Disp: 90 tablet, Rfl: 3  Allergies:  Allergies  Allergen Reactions  . Erythromycin      Upset stomach   . Lisinopril Cough  . Other Swelling    Bee Sting     Past Medical History, Surgical history, Social history, and Family History were reviewed and updated.  Review of Systems: Review of Systems  Constitutional: Negative.   HENT:  Negative.   Eyes: Negative.   Respiratory: Negative.   Cardiovascular: Negative.   Gastrointestinal: Negative.   Endocrine: Negative.   Genitourinary: Negative.    Musculoskeletal: Negative.   Skin: Negative.   Neurological: Negative.   Hematological: Negative.   Psychiatric/Behavioral: Negative.     Physical Exam:  weight is 256 lb 12 oz (116.5 kg). His oral temperature is 98.7 F (37.1 C). His blood pressure is 157/74 (abnormal) and his pulse is 72. His respiration is 18 and oxygen saturation is 99%.   Wt Readings from Last 3 Encounters:  04/21/20 256 lb 12 oz (116.5 kg)  04/07/20 257 lb (116.6 kg)  03/24/20 259 lb 8 oz (117.7 kg)    Physical Exam Vitals reviewed.  HENT:     Head: Normocephalic and atraumatic.  Eyes:     Pupils: Pupils are equal, round, and reactive to light.  Cardiovascular:     Rate and Rhythm: Normal rate and regular rhythm.     Heart sounds: Normal heart sounds.  Pulmonary:     Effort: Pulmonary effort is normal.  Breath sounds: Normal breath sounds.  Abdominal:     General: Bowel sounds are normal.     Palpations: Abdomen is soft.     Comments: Abdominal exam shows well-healed laparoscopy scars.  He has no abdominal distention.  He has good bowel sounds.  There is no fluid wave.  There is no palpable liver or spleen tip.  Musculoskeletal:        General: No tenderness or deformity. Normal range of motion.     Cervical back: Normal range of motion.  Lymphadenopathy:     Cervical: No cervical adenopathy.  Skin:    General: Skin is warm and dry.     Findings: No erythema or rash.  Neurological:     Mental Status: He is alert and oriented to person, place, and time.  Psychiatric:         Behavior: Behavior normal.        Thought Content: Thought content normal.        Judgment: Judgment normal.      Lab Results  Component Value Date   WBC 5.4 04/21/2020   HGB 12.2 (L) 04/21/2020   HCT 37.4 (L) 04/21/2020   MCV 84.6 04/21/2020   PLT 119 (L) 04/21/2020     Chemistry      Component Value Date/Time   NA 137 04/07/2020 1150   K 3.8 04/07/2020 1150   CL 102 04/07/2020 1150   CO2 26 04/07/2020 1150   BUN 16 04/07/2020 1150   CREATININE 1.09 04/07/2020 1150   CREATININE 1.14 12/04/2019 1310      Component Value Date/Time   CALCIUM 9.5 04/07/2020 1150   ALKPHOS 86 04/07/2020 1150   AST 18 04/07/2020 1150   ALT 22 04/07/2020 1150   BILITOT 0.9 04/07/2020 1150      Impression and Plan: [name] is a a very nice 65 year old white male.  He has stage IIIb sigmoid colon cancer.  I think he is doing well.  We will go ahead with his fourth cycle of treatment.  After the fourth cycle of treatment, we will then do a CT scan to see how everything looks.  He certainly seems to be doing better with respect to the anxiety and occasional depression that he gets.  I am just happy to see that his quality of life is doing so well.  We will get him back in 2 more weeks.     Volanda Napoleon, MD 3/8/202212:04 PM

## 2020-04-21 NOTE — Progress Notes (Signed)
Patient needs CT prior to next MD appointment. Scheduled. Calendar printed for patient. He knows to go downstairs and pick up contrast after his appointments here.   Oncology Nurse Navigator Documentation  Oncology Nurse Navigator Flowsheets 04/21/2020  Abnormal Finding Date -  Diagnosis Status -  Phase of Treatment -  Chemotherapy Actual Start Date: -  Expected Surgery Date -  Surgery Actual Start Date: -  Navigator Follow Up Date: 05/05/2020  Navigator Follow Up Reason: Follow-up Appointment;Chemotherapy;Scan Review  Navigator Location [facility]  Referral Date to RadOnc/MedOnc -  Navigator Encounter Type Treatment;Appt/Treatment Plan Review  Telephone -  Treatment Initiated Date -  Patient Visit Type MedOnc  Treatment Phase Active Tx  Barriers/Navigation Needs Coordination of Care;Education  Education Other  Interventions Coordination of Care;Education;Psycho-Social Support  Acuity Level 2-Minimal Needs (1-2 Barriers Identified)  Referrals -  Coordination of Care Radiology;Appts  Education Method Verbal;Written;Teach-back  Support Groups/Services Friends and Family  Time Spent with Patient 48

## 2020-04-22 LAB — CEA (IN HOUSE-CHCC): CEA (CHCC-In House): <1 ng/mL (ref 0.00–5.00)

## 2020-04-23 ENCOUNTER — Inpatient Hospital Stay: Payer: Medicare Other

## 2020-04-23 ENCOUNTER — Other Ambulatory Visit: Payer: Self-pay

## 2020-04-23 VITALS — BP 159/70 | HR 67 | Temp 99.1°F | Resp 18

## 2020-04-23 DIAGNOSIS — Z5111 Encounter for antineoplastic chemotherapy: Secondary | ICD-10-CM | POA: Diagnosis not present

## 2020-04-23 DIAGNOSIS — C189 Malignant neoplasm of colon, unspecified: Secondary | ICD-10-CM

## 2020-04-23 MED ORDER — SODIUM CHLORIDE 0.9% FLUSH
10.0000 mL | INTRAVENOUS | Status: DC | PRN
Start: 2020-04-23 — End: 2020-04-23
  Administered 2020-04-23: 10 mL
  Filled 2020-04-23: qty 10

## 2020-04-23 MED ORDER — HEPARIN SOD (PORK) LOCK FLUSH 100 UNIT/ML IV SOLN
500.0000 [IU] | Freq: Once | INTRAVENOUS | Status: AC | PRN
  Administered 2020-04-23: 500 [IU]
  Filled 2020-04-23: qty 5

## 2020-04-23 NOTE — Patient Instructions (Signed)

## 2020-05-05 ENCOUNTER — Encounter: Payer: Self-pay | Admitting: Hematology & Oncology

## 2020-05-05 ENCOUNTER — Inpatient Hospital Stay: Payer: Medicare Other

## 2020-05-05 ENCOUNTER — Telehealth: Payer: Self-pay | Admitting: Hematology & Oncology

## 2020-05-05 ENCOUNTER — Inpatient Hospital Stay (HOSPITAL_BASED_OUTPATIENT_CLINIC_OR_DEPARTMENT_OTHER): Payer: Medicare Other | Admitting: Hematology & Oncology

## 2020-05-05 ENCOUNTER — Ambulatory Visit (HOSPITAL_BASED_OUTPATIENT_CLINIC_OR_DEPARTMENT_OTHER)
Admission: RE | Admit: 2020-05-05 | Discharge: 2020-05-05 | Disposition: A | Discharge status: Home / Self Care | Payer: Medicare Other | Source: Ambulatory Visit | Attending: Hematology & Oncology | Admitting: Hematology & Oncology

## 2020-05-05 ENCOUNTER — Encounter (HOSPITAL_BASED_OUTPATIENT_CLINIC_OR_DEPARTMENT_OTHER): Payer: Self-pay

## 2020-05-05 ENCOUNTER — Other Ambulatory Visit: Payer: Self-pay

## 2020-05-05 ENCOUNTER — Encounter: Payer: Self-pay | Admitting: *Deleted

## 2020-05-05 VITALS — BP 164/85 | HR 76 | Temp 98.7°F | Resp 18 | Wt 257.5 lb

## 2020-05-05 DIAGNOSIS — C185 Malignant neoplasm of splenic flexure: Secondary | ICD-10-CM | POA: Insufficient documentation

## 2020-05-05 DIAGNOSIS — C184 Malignant neoplasm of transverse colon: Secondary | ICD-10-CM | POA: Diagnosis not present

## 2020-05-05 DIAGNOSIS — C189 Malignant neoplasm of colon, unspecified: Secondary | ICD-10-CM

## 2020-05-05 DIAGNOSIS — Z5111 Encounter for antineoplastic chemotherapy: Secondary | ICD-10-CM | POA: Diagnosis not present

## 2020-05-05 LAB — CMP (CANCER CENTER ONLY)
ALT: 19 U/L (ref 0–44)
AST: 23 U/L (ref 15–41)
Albumin: 3.9 g/dL (ref 3.5–5.0)
Alkaline Phosphatase: 78 U/L (ref 38–126)
Anion gap: 8 (ref 5–15)
BUN: 13 mg/dL (ref 8–23)
CO2: 27 mmol/L (ref 22–32)
Calcium: 9.4 mg/dL (ref 8.9–10.3)
Chloride: 103 mmol/L (ref 98–111)
Creatinine: 0.95 mg/dL (ref 0.61–1.24)
GFR, Estimated: >60 mL/min (ref >60)
Glucose, Bld: 99 mg/dL (ref 70–99)
Potassium: 4 mmol/L (ref 3.5–5.1)
Sodium: 138 mmol/L (ref 135–145)
Total Bilirubin: 0.7 mg/dL (ref 0.3–1.2)
Total Protein: 6.7 g/dL (ref 6.5–8.1)

## 2020-05-05 LAB — CBC WITH DIFFERENTIAL (CANCER CENTER ONLY)
Abs Immature Granulocytes: 0.02 10*3/uL (ref 0.00–0.07)
Basophils Absolute: 0.1 10*3/uL (ref 0.0–0.1)
Basophils Relative: 1 %
Eosinophils Absolute: 0 10*3/uL (ref 0.0–0.5)
Eosinophils Relative: 1 %
HCT: 38 % — ABNORMAL LOW (ref 39.0–52.0)
Hemoglobin: 12.5 g/dL — ABNORMAL LOW (ref 13.0–17.0)
Immature Granulocytes: 0 %
Lymphocytes Relative: 29 %
Lymphs Abs: 1.3 10*3/uL (ref 0.7–4.0)
MCH: 28.2 pg (ref 26.0–34.0)
MCHC: 32.9 g/dL (ref 30.0–36.0)
MCV: 85.8 fL (ref 80.0–100.0)
Monocytes Absolute: 0.5 10*3/uL (ref 0.1–1.0)
Monocytes Relative: 12 %
Neutro Abs: 2.7 10*3/uL (ref 1.7–7.7)
Neutrophils Relative %: 57 %
Platelet Count: 129 10*3/uL — ABNORMAL LOW (ref 150–400)
RBC: 4.43 MIL/uL (ref 4.22–5.81)
RDW: 18.1 % — ABNORMAL HIGH (ref 11.5–15.5)
WBC Count: 4.6 10*3/uL (ref 4.0–10.5)
nRBC: 0 % (ref 0.0–0.2)

## 2020-05-05 LAB — CEA (IN HOUSE-CHCC): CEA (CHCC-In House): <1 ng/mL (ref 0.00–5.00)

## 2020-05-05 LAB — LACTATE DEHYDROGENASE: LDH: 177 U/L (ref 98–192)

## 2020-05-05 MED ORDER — SODIUM CHLORIDE 0.9 % IV SOLN
2400.0000 mg/m2 | INTRAVENOUS | Status: DC
  Administered 2020-05-05: 6100 mg via INTRAVENOUS
  Filled 2020-05-05: qty 122

## 2020-05-05 MED ORDER — SODIUM CHLORIDE 0.9% FLUSH
10.0000 mL | INTRAVENOUS | Status: DC | PRN
  Filled 2020-05-05: qty 10

## 2020-05-05 MED ORDER — PALONOSETRON HCL INJECTION 0.25 MG/5ML
0.2500 mg | Freq: Once | INTRAVENOUS | Status: AC
  Administered 2020-05-05: 0.25 mg via INTRAVENOUS

## 2020-05-05 MED ORDER — DEXTROSE 5 % IV SOLN
Freq: Once | INTRAVENOUS | Status: AC
  Filled 2020-05-05: qty 250

## 2020-05-05 MED ORDER — OXALIPLATIN CHEMO INJECTION 100 MG/20ML
79.0000 mg/m2 | Freq: Once | INTRAVENOUS | Status: AC
  Administered 2020-05-05: 200 mg via INTRAVENOUS
  Filled 2020-05-05: qty 40

## 2020-05-05 MED ORDER — FLUOROURACIL CHEMO INJECTION 2.5 GM/50ML
400.0000 mg/m2 | Freq: Once | INTRAVENOUS | Status: AC
Start: 2020-05-05 — End: 2020-05-05
  Administered 2020-05-05: 1000 mg via INTRAVENOUS
  Filled 2020-05-05: qty 20

## 2020-05-05 MED ORDER — IOHEXOL 300 MG/ML  SOLN
100.0000 mL | Freq: Once | INTRAMUSCULAR | Status: AC | PRN
  Administered 2020-05-05: 100 mL via INTRAVENOUS

## 2020-05-05 MED ORDER — PALONOSETRON HCL INJECTION 0.25 MG/5ML
INTRAVENOUS | Status: AC
  Filled 2020-05-05: qty 5

## 2020-05-05 MED ORDER — HEPARIN SOD (PORK) LOCK FLUSH 100 UNIT/ML IV SOLN
500.0000 [IU] | Freq: Once | INTRAVENOUS | Status: DC | PRN
  Filled 2020-05-05: qty 5

## 2020-05-05 MED ORDER — SODIUM CHLORIDE 0.9 % IV SOLN
10.0000 mg | Freq: Once | INTRAVENOUS | Status: AC
  Administered 2020-05-05: 10 mg via INTRAVENOUS
  Filled 2020-05-05: qty 10

## 2020-05-05 MED ORDER — LEUCOVORIN CALCIUM INJECTION 350 MG
1000.0000 mg | Freq: Once | INTRAVENOUS | Status: AC
  Administered 2020-05-05: 1000 mg via INTRAVENOUS
  Filled 2020-05-05: qty 50

## 2020-05-05 NOTE — Patient Instructions (Signed)
Implanted Port Insertion, Care After This sheet gives you information about how to care for yourself after your procedure. Your health care provider may also give you more specific instructions. If you have problems or questions, contact your health care provider. What can I expect after the procedure? After the procedure, it is common to have:  Discomfort at the port insertion site.  Bruising on the skin over the port. This should improve over 3-4 days. Follow these instructions at home: Port care  After your port is placed, you will get a manufacturer's information card. The card has information about your port. Keep this card with you at all times.  Take care of the port as told by your health care provider. Ask your health care provider if you or a family member can get training for taking care of the port at home. A home health care nurse may also take care of the port.  Make sure to remember what type of port you have. Incision care  Follow instructions from your health care provider about how to take care of your port insertion site. Make sure you: ? Wash your hands with soap and water before and after you change your bandage (dressing). If soap and water are not available, use hand sanitizer. ? Change your dressing as told by your health care provider. ? Leave stitches (sutures), skin glue, or adhesive strips in place. These skin closures may need to stay in place for 2 weeks or longer. If adhesive strip edges start to loosen and curl up, you may trim the loose edges. Do not remove adhesive strips completely unless your health care provider tells you to do that.  Check your port insertion site every day for signs of infection. Check for: ? Redness, swelling, or pain. ? Fluid or blood. ? Warmth. ? Pus or a bad smell.      Activity  Return to your normal activities as told by your health care provider. Ask your health care provider what activities are safe for you.  Do not  lift anything that is heavier than 10 lb (4.5 kg), or the limit that you are told, until your health care provider says that it is safe. General instructions  Take over-the-counter and prescription medicines only as told by your health care provider.  Do not take baths, swim, or use a hot tub until your health care provider approves. Ask your health care provider if you may take showers. You may only be allowed to take sponge baths.  Do not drive for 24 hours if you were given a sedative during your procedure.  Wear a medical alert bracelet in case of an emergency. This will tell any health care providers that you have a port.  Keep all follow-up visits as told by your health care provider. This is important. Contact a health care provider if:  You cannot flush your port with saline as directed, or you cannot draw blood from the port.  You have a fever or chills.  You have redness, swelling, or pain around your port insertion site.  You have fluid or blood coming from your port insertion site.  Your port insertion site feels warm to the touch.  You have pus or a bad smell coming from the port insertion site. Get help right away if:  You have chest pain or shortness of breath.  You have bleeding from your port that you cannot control. Summary  Take care of the port as told by your   health care provider. Keep the manufacturer's information card with you at all times.  Change your dressing as told by your health care provider.  Contact a health care provider if you have a fever or chills or if you have redness, swelling, or pain around your port insertion site.  Keep all follow-up visits as told by your health care provider. This information is not intended to replace advice given to you by your health care provider. Make sure you discuss any questions you have with your health care provider. Document Revised: 08/29/2017 Document Reviewed: 08/29/2017 Elsevier Patient Education   2021 Elsevier Inc.  

## 2020-05-05 NOTE — Telephone Encounter (Signed)
Appointments scheduled patient wo;; get updates from My Chart per 3/22 los

## 2020-05-05 NOTE — Progress Notes (Signed)
Hematology and Oncology Follow Up Visit  [name] [id number] Jul 07, 1955 65 y.o. 05/05/2020   Principle Diagnosis:   Stage IIIb (T3N1M0) moderately differentiated adenocarcinoma of the sigmoid colon  Current Therapy:    Status post resection on 01/28/2020  Adjuvant chemotherapy with FOLFOX-s/p cycle #4 --start on 03/12/2020     Interim History:  [name] is back for follow-up.  He had a CAT scan done today.  The CAT scan did not show any evidence of recurrent colon cancer.  I was very confident that the CAT scan would look fine.  His last CEA level was less than 1.  He is doing well.  He is really having very little problems with the chemotherapy.  He has had no problems with bowels or bladder.  He has had no cough or shortness of breath.  He has had no nausea or vomiting.  He has had no leg swelling.  There is been no rashes.  Overall, his performance status is ECOG 0.   Medications:  Current Outpatient Medications:  .  cetirizine (ZYRTEC) 10 MG chewable tablet, Chew 10 mg by mouth daily., Disp: , Rfl:  .  dexamethasone (DECADRON) 4 MG tablet, Take 2 tablets (8 mg total) by mouth daily. Start the day after chemotherapy for 2 days. Take with food., Disp: 30 tablet, Rfl: 1 .  ondansetron (ZOFRAN) 8 MG tablet, Take 1 tablet (8 mg total) by mouth 2 (two) times daily as needed for refractory nausea / vomiting. Start on day 3 after chemotherapy., Disp: 30 tablet, Rfl: 1 .  prochlorperazine (COMPAZINE) 10 MG tablet, TAKE 1 TABLET(10 MG) BY MOUTH EVERY 6 HOURS AS NEEDED FOR NAUSEA OR VOMITING, Disp: 30 tablet, Rfl: 1 .  telmisartan (MICARDIS) 40 MG tablet, Take 40 mg by mouth daily., Disp: , Rfl:  .  lidocaine-prilocaine (EMLA) cream, Apply to affected area once (Patient not taking: No sig reported), Disp: 30 g, Rfl: 3 .  pantoprazole (PROTONIX) 40 MG tablet, Take 1 tablet (40 mg total) by mouth 2 (two) times daily., Disp: 90 tablet, Rfl: 3  Allergies:  Allergies  Allergen  Reactions  . Erythromycin     Upset stomach   . Lisinopril Cough  . Other Swelling    Bee Sting     Past Medical History, Surgical history, Social history, and Family History were reviewed and updated.  Review of Systems: Review of Systems  Constitutional: Negative.   HENT:  Negative.   Eyes: Negative.   Respiratory: Negative.   Cardiovascular: Negative.   Gastrointestinal: Negative.   Endocrine: Negative.   Genitourinary: Negative.    Musculoskeletal: Negative.   Skin: Negative.   Neurological: Negative.   Hematological: Negative.   Psychiatric/Behavioral: Negative.     Physical Exam:  weight is 257 lb 8 oz (116.8 kg). His oral temperature is 98.7 F (37.1 C). His blood pressure is 164/85 (abnormal) and his pulse is 76. His respiration is 18 and oxygen saturation is 98%.   Wt Readings from Last 3 Encounters:  05/05/20 257 lb 8 oz (116.8 kg)  04/21/20 256 lb 12 oz (116.5 kg)  04/07/20 257 lb (116.6 kg)    Physical Exam Vitals reviewed.  HENT:     Head: Normocephalic and atraumatic.  Eyes:     Pupils: Pupils are equal, round, and reactive to light.  Cardiovascular:     Rate and Rhythm: Normal rate and regular rhythm.     Heart sounds: Normal heart sounds.  Pulmonary:     Effort: Pulmonary  effort is normal.     Breath sounds: Normal breath sounds.  Abdominal:     General: Bowel sounds are normal.     Palpations: Abdomen is soft.     Comments: Abdominal exam shows well-healed laparoscopy scars.  He has no abdominal distention.  He has good bowel sounds.  There is no fluid wave.  There is no palpable liver or spleen tip.  Musculoskeletal:        General: No tenderness or deformity. Normal range of motion.     Cervical back: Normal range of motion.  Lymphadenopathy:     Cervical: No cervical adenopathy.  Skin:    General: Skin is warm and dry.     Findings: No erythema or rash.  Neurological:     Mental Status: He is alert and oriented to person, place, and  time.  Psychiatric:        Behavior: Behavior normal.        Thought Content: Thought content normal.        Judgment: Judgment normal.      Lab Results  Component Value Date   WBC 4.6 05/05/2020   HGB 12.5 (L) 05/05/2020   HCT 38.0 (L) 05/05/2020   MCV 85.8 05/05/2020   PLT 129 (L) 05/05/2020     Chemistry      Component Value Date/Time   NA 138 05/05/2020 0838   K 4.0 05/05/2020 0838   CL 103 05/05/2020 0838   CO2 27 05/05/2020 0838   BUN 13 05/05/2020 0838   CREATININE 0.95 05/05/2020 0838   CREATININE 1.14 12/04/2019 1310      Component Value Date/Time   CALCIUM 9.4 05/05/2020 0838   ALKPHOS 78 05/05/2020 0838   AST 23 05/05/2020 0838   ALT 19 05/05/2020 0838   BILITOT 0.7 05/05/2020 0838      Impression and Plan: [name] is a a very nice 65 year old white male.  He has stage IIIb sigmoid colon cancer.  I think he is doing well.  We will go ahead with his fifth cycle of treatment.   He certainly seems to be doing better with respect to the anxiety and occasional depression that he gets.  I am just happy to see that his quality of life is doing so well.  We will get him back in 3 more weeks.  We will give him an extra week off so that he can go and attend an event that he wants to be part of.   Volanda Napoleon, MD 3/22/202210:15 AM

## 2020-05-05 NOTE — Progress Notes (Signed)
Oncology Nurse Navigator Documentation  Oncology Nurse Navigator Flowsheets 05/05/2020  Abnormal Finding Date -  Diagnosis Status -  Phase of Treatment -  Chemotherapy Actual Start Date: -  Expected Surgery Date -  Surgery Actual Start Date: -  Navigator Follow Up Date: 05/26/2020  Navigator Follow Up Reason: Follow-up Appointment;Chemotherapy  Navigator Location [facility]  Referral Date to RadOnc/MedOnc -  Navigator Encounter Type Treatment;Appt/Treatment Plan Review  Telephone -  Treatment Initiated Date -  Patient Visit Type MedOnc  Treatment Phase Active Tx  Barriers/Navigation Needs Coordination of Care;Education  Education -  Interventions Psycho-Social Support  Acuity Level 2-Minimal Needs (1-2 Barriers Identified)  Referrals -  Coordination of Care -  Education Method -  Support Groups/Services Friends and Family  Time Spent with Patient 15

## 2020-05-05 NOTE — Patient Instructions (Signed)
[facility] Discharge Instructions for Patients Receiving Chemotherapy  Today you received the following chemotherapy agents Oxaliplatin/Leucovorin/5 Fu  To help prevent nausea and vomiting after your treatment, we encourage you to take your nausea medication as prescribed.   **DO NOT TAKE ZOFRAN FOR 3 DAYS AFTER CHEMOTHERAPY**   If you develop nausea and vomiting that is not controlled by your nausea medication, call the clinic.   BELOW ARE SYMPTOMS THAT SHOULD BE REPORTED IMMEDIATELY:  *FEVER GREATER THAN 100.5 F  *CHILLS WITH OR WITHOUT FEVER  NAUSEA AND VOMITING THAT IS NOT CONTROLLED WITH YOUR NAUSEA MEDICATION  *UNUSUAL SHORTNESS OF BREATH  *UNUSUAL BRUISING OR BLEEDING  TENDERNESS IN MOUTH AND THROAT WITH OR WITHOUT PRESENCE OF ULCERS  *URINARY PROBLEMS  *BOWEL PROBLEMS  UNUSUAL RASH Items with * indicate a potential emergency and should be followed up as soon as possible.  Feel free to call the clinic should you have any questions or concerns. The clinic phone number is [phone].  Please show the [facility] at check-in to the Emergency Department and triage nurse.

## 2020-05-07 ENCOUNTER — Other Ambulatory Visit: Payer: Self-pay

## 2020-05-07 ENCOUNTER — Inpatient Hospital Stay: Payer: Medicare Other

## 2020-05-07 VITALS — BP 152/88 | HR 75 | Temp 98.3°F | Resp 17

## 2020-05-07 DIAGNOSIS — C189 Malignant neoplasm of colon, unspecified: Secondary | ICD-10-CM

## 2020-05-07 DIAGNOSIS — Z5111 Encounter for antineoplastic chemotherapy: Secondary | ICD-10-CM | POA: Diagnosis not present

## 2020-05-07 MED ORDER — SODIUM CHLORIDE 0.9% FLUSH
10.0000 mL | INTRAVENOUS | Status: DC | PRN
  Administered 2020-05-07: 10 mL
  Filled 2020-05-07: qty 10

## 2020-05-07 MED ORDER — HEPARIN SOD (PORK) LOCK FLUSH 100 UNIT/ML IV SOLN
500.0000 [IU] | Freq: Once | INTRAVENOUS | Status: AC | PRN
  Administered 2020-05-07: 500 [IU]
  Filled 2020-05-07: qty 5

## 2020-05-26 ENCOUNTER — Other Ambulatory Visit: Payer: Self-pay | Admitting: Gastroenterology

## 2020-05-26 ENCOUNTER — Inpatient Hospital Stay (HOSPITAL_BASED_OUTPATIENT_CLINIC_OR_DEPARTMENT_OTHER): Payer: Medicare Other | Admitting: Hematology & Oncology

## 2020-05-26 ENCOUNTER — Encounter: Payer: Self-pay | Admitting: *Deleted

## 2020-05-26 ENCOUNTER — Telehealth: Payer: Self-pay

## 2020-05-26 ENCOUNTER — Other Ambulatory Visit: Payer: Self-pay

## 2020-05-26 ENCOUNTER — Inpatient Hospital Stay: Payer: Medicare Other

## 2020-05-26 ENCOUNTER — Encounter: Payer: Self-pay | Admitting: Hematology & Oncology

## 2020-05-26 ENCOUNTER — Inpatient Hospital Stay: Payer: Medicare Other | Attending: Hematology & Oncology

## 2020-05-26 VITALS — BP 152/83 | HR 67 | Temp 96.9°F | Resp 18 | Wt 262.4 lb

## 2020-05-26 DIAGNOSIS — C184 Malignant neoplasm of transverse colon: Secondary | ICD-10-CM

## 2020-05-26 DIAGNOSIS — Z79899 Other long term (current) drug therapy: Secondary | ICD-10-CM | POA: Diagnosis not present

## 2020-05-26 DIAGNOSIS — C187 Malignant neoplasm of sigmoid colon: Secondary | ICD-10-CM | POA: Diagnosis present

## 2020-05-26 DIAGNOSIS — Z5111 Encounter for antineoplastic chemotherapy: Secondary | ICD-10-CM | POA: Insufficient documentation

## 2020-05-26 DIAGNOSIS — M7989 Other specified soft tissue disorders: Secondary | ICD-10-CM | POA: Insufficient documentation

## 2020-05-26 DIAGNOSIS — C189 Malignant neoplasm of colon, unspecified: Secondary | ICD-10-CM

## 2020-05-26 LAB — CBC WITH DIFFERENTIAL (CANCER CENTER ONLY)
Abs Immature Granulocytes: 0.03 10*3/uL (ref 0.00–0.07)
Basophils Absolute: 0 10*3/uL (ref 0.0–0.1)
Basophils Relative: 1 %
Eosinophils Absolute: 0 10*3/uL (ref 0.0–0.5)
Eosinophils Relative: 1 %
HCT: 36.5 % — ABNORMAL LOW (ref 39.0–52.0)
Hemoglobin: 11.9 g/dL — ABNORMAL LOW (ref 13.0–17.0)
Immature Granulocytes: 1 %
Lymphocytes Relative: 40 %
Lymphs Abs: 1.2 10*3/uL (ref 0.7–4.0)
MCH: 29.2 pg (ref 26.0–34.0)
MCHC: 32.6 g/dL (ref 30.0–36.0)
MCV: 89.7 fL (ref 80.0–100.0)
Monocytes Absolute: 0.6 10*3/uL (ref 0.1–1.0)
Monocytes Relative: 18 %
Neutro Abs: 1.2 10*3/uL — ABNORMAL LOW (ref 1.7–7.7)
Neutrophils Relative %: 39 %
Platelet Count: 174 10*3/uL (ref 150–400)
RBC: 4.07 MIL/uL — ABNORMAL LOW (ref 4.22–5.81)
RDW: 19.7 % — ABNORMAL HIGH (ref 11.5–15.5)
WBC Count: 3 10*3/uL — ABNORMAL LOW (ref 4.0–10.5)
nRBC: 0 % (ref 0.0–0.2)

## 2020-05-26 LAB — CMP (CANCER CENTER ONLY)
ALT: 17 U/L (ref 0–44)
AST: 22 U/L (ref 15–41)
Albumin: 3.6 g/dL (ref 3.5–5.0)
Alkaline Phosphatase: 69 U/L (ref 38–126)
Anion gap: 8 (ref 5–15)
BUN: 12 mg/dL (ref 8–23)
CO2: 26 mmol/L (ref 22–32)
Calcium: 9.2 mg/dL (ref 8.9–10.3)
Chloride: 105 mmol/L (ref 98–111)
Creatinine: 1 mg/dL (ref 0.61–1.24)
GFR, Estimated: >60 mL/min (ref >60)
Glucose, Bld: 90 mg/dL (ref 70–99)
Potassium: 3.9 mmol/L (ref 3.5–5.1)
Sodium: 139 mmol/L (ref 135–145)
Total Bilirubin: 0.6 mg/dL (ref 0.3–1.2)
Total Protein: 6.1 g/dL — ABNORMAL LOW (ref 6.5–8.1)

## 2020-05-26 LAB — CEA (IN HOUSE-CHCC): CEA (CHCC-In House): <1 ng/mL (ref 0.00–5.00)

## 2020-05-26 MED ORDER — DEXTROSE 5 % IV SOLN
Freq: Once | INTRAVENOUS | Status: AC
  Filled 2020-05-26: qty 250

## 2020-05-26 MED ORDER — SODIUM CHLORIDE 0.9 % IV SOLN
10.0000 mg | Freq: Once | INTRAVENOUS | Status: AC
  Administered 2020-05-26: 10 mg via INTRAVENOUS
  Filled 2020-05-26: qty 10

## 2020-05-26 MED ORDER — OXALIPLATIN CHEMO INJECTION 100 MG/20ML
79.0000 mg/m2 | Freq: Once | INTRAVENOUS | Status: AC
  Administered 2020-05-26: 200 mg via INTRAVENOUS
  Filled 2020-05-26: qty 40

## 2020-05-26 MED ORDER — LEUCOVORIN CALCIUM INJECTION 350 MG
1000.0000 mg | Freq: Once | INTRAVENOUS | Status: AC
  Administered 2020-05-26: 1000 mg via INTRAVENOUS
  Filled 2020-05-26: qty 50

## 2020-05-26 MED ORDER — FLUOROURACIL CHEMO INJECTION 2.5 GM/50ML
400.0000 mg/m2 | Freq: Once | INTRAVENOUS | Status: AC
  Administered 2020-05-26: 1000 mg via INTRAVENOUS
  Filled 2020-05-26: qty 20

## 2020-05-26 MED ORDER — SODIUM CHLORIDE 0.9 % IV SOLN
2400.0000 mg/m2 | INTRAVENOUS | Status: DC
  Administered 2020-05-26: 6100 mg via INTRAVENOUS
  Filled 2020-05-26: qty 122

## 2020-05-26 MED ORDER — SODIUM CHLORIDE 0.9% FLUSH
10.0000 mL | INTRAVENOUS | Status: DC | PRN
  Filled 2020-05-26: qty 10

## 2020-05-26 MED ORDER — PALONOSETRON HCL INJECTION 0.25 MG/5ML
INTRAVENOUS | Status: AC
  Filled 2020-05-26: qty 5

## 2020-05-26 MED ORDER — PALONOSETRON HCL INJECTION 0.25 MG/5ML
0.2500 mg | Freq: Once | INTRAVENOUS | Status: AC
  Administered 2020-05-26: 0.25 mg via INTRAVENOUS

## 2020-05-26 MED ORDER — HEPARIN SOD (PORK) LOCK FLUSH 100 UNIT/ML IV SOLN
500.0000 [IU] | Freq: Once | INTRAVENOUS | Status: DC | PRN
  Filled 2020-05-26: qty 5

## 2020-05-26 NOTE — Patient Instructions (Signed)

## 2020-05-26 NOTE — Progress Notes (Signed)
Hematology and Oncology Follow Up Visit  John [name] [id number] Jan 20, 1956 65 y.o. 05/26/2020   Principle Diagnosis:   Stage IIIb (T3N1M0) moderately differentiated adenocarcinoma of the sigmoid colon  Current Therapy:    Status post resection on 01/28/2020  Adjuvant chemotherapy with FOLFOX-s/p cycle #5 --start on 03/12/2020     Interim History:  John [name] is back for follow-up.  He feels a whole lot better given the extra week off.  I am glad that we gave him the extra week off.  He is able to go up to [place] and participate in a car rally.  He really enjoyed this.  He is eating well.  Is a little bit of swelling in the legs.  He has had no problems with bowels or bladder.  He has had no cough or shortness of breath.  He has had no nausea or vomiting.  There is been no problems with bleeding.  His appetite has been pretty decent.  Overall, his performance status is ECOG 1.    Medications:  Current Outpatient Medications:  .  cetirizine (ZYRTEC) 10 MG chewable tablet, Chew 10 mg by mouth daily., Disp: , Rfl:  .  dexamethasone (DECADRON) 4 MG tablet, Take 2 tablets (8 mg total) by mouth daily. Start the day after chemotherapy for 2 days. Take with food., Disp: 30 tablet, Rfl: 1 .  pantoprazole (PROTONIX) 40 MG tablet, Take 40 mg by mouth daily., Disp: , Rfl:  .  telmisartan (MICARDIS) 40 MG tablet, Take 40 mg by mouth daily., Disp: , Rfl:  .  lidocaine-prilocaine (EMLA) cream, Apply to affected area once (Patient not taking: No sig reported), Disp: 30 g, Rfl: 3 .  ondansetron (ZOFRAN) 8 MG tablet, Take 1 tablet (8 mg total) by mouth 2 (two) times daily as needed for refractory nausea / vomiting. Start on day 3 after chemotherapy. (Patient not taking: Reported on 05/26/2020), Disp: 30 tablet, Rfl: 1 .  pantoprazole (PROTONIX) 40 MG tablet, Take 1 tablet (40 mg total) by mouth 2 (two) times daily., Disp: 90 tablet, Rfl: 3 .  prochlorperazine (COMPAZINE) 10 MG tablet, TAKE 1  TABLET(10 MG) BY MOUTH EVERY 6 HOURS AS NEEDED FOR NAUSEA OR VOMITING (Patient not taking: Reported on 05/26/2020), Disp: 30 tablet, Rfl: 1  Allergies:  Allergies  Allergen Reactions  . Erythromycin     Upset stomach   . Lisinopril Cough  . Other Swelling    Bee Sting     Past Medical History, Surgical history, Social history, and Family History were reviewed and updated.  Review of Systems: Review of Systems  Constitutional: Negative.   HENT:  Negative.  Negative for voice change.   Eyes: Negative.   Respiratory: Negative.   Cardiovascular: Negative.   Gastrointestinal: Negative.   Endocrine: Negative.   Genitourinary: Negative.    Musculoskeletal: Negative.   Skin: Negative.   Neurological: Negative.   Hematological: Negative.   Psychiatric/Behavioral: Negative.     Physical Exam:  weight is 262 lb 6.4 oz (119 kg). His oral temperature is 96.9 F (36.1 C) (abnormal). His blood pressure is 152/83 (abnormal) and his pulse is 67. His respiration is 18 and oxygen saturation is 98%.   Wt Readings from Last 3 Encounters:  05/26/20 262 lb 6.4 oz (119 kg)  05/05/20 257 lb 8 oz (116.8 kg)  04/21/20 256 lb 12 oz (116.5 kg)    Physical Exam Vitals reviewed.  HENT:     Head: Normocephalic and atraumatic.  Eyes:  Pupils: Pupils are equal, round, and reactive to light.  Cardiovascular:     Rate and Rhythm: Normal rate and regular rhythm.     Heart sounds: Normal heart sounds.  Pulmonary:     Effort: Pulmonary effort is normal.     Breath sounds: Normal breath sounds.  Abdominal:     General: Bowel sounds are normal.     Palpations: Abdomen is soft.     Comments: Abdominal exam shows well-healed laparoscopy scars.  He has no abdominal distention.  He has good bowel sounds.  There is no fluid wave.  There is no palpable liver or spleen tip.  Musculoskeletal:        General: No tenderness or deformity. Normal range of motion.     Cervical back: Normal range of motion.   Lymphadenopathy:     Cervical: No cervical adenopathy.  Skin:    General: Skin is warm and dry.     Findings: No erythema or rash.  Neurological:     Mental Status: He is alert and oriented to person, place, and time.  Psychiatric:        Behavior: Behavior normal.        Thought Content: Thought content normal.        Judgment: Judgment normal.      Lab Results  Component Value Date   WBC 3.0 (L) 05/26/2020   HGB 11.9 (L) 05/26/2020   HCT 36.5 (L) 05/26/2020   MCV 89.7 05/26/2020   PLT 174 05/26/2020     Chemistry      Component Value Date/Time   NA 138 05/05/2020 0838   K 4.0 05/05/2020 0838   CL 103 05/05/2020 0838   CO2 27 05/05/2020 0838   BUN 13 05/05/2020 0838   CREATININE 0.95 05/05/2020 0838   CREATININE 1.14 12/04/2019 1310      Component Value Date/Time   CALCIUM 9.4 05/05/2020 0838   ALKPHOS 78 05/05/2020 0838   AST 23 05/05/2020 0838   ALT 19 05/05/2020 0838   BILITOT 0.7 05/05/2020 0838      Impression and Plan: John [name] is a a very nice 65 year old white male.  He has stage IIIb sigmoid colon cancer.  I think he is doing well.  We will go ahead with his sixth cycle of treatment.  After this, he only has 2 more treatments.  He did well with the extra week off.  We can now go ahead and treat back on schedule every 2 weeks.  We will plan to get her back in 2 weeks.    Volanda Napoleon, MD 4/12/20228:54 AM

## 2020-05-26 NOTE — Telephone Encounter (Signed)
appts made per 05/26/20 los and pt will rec a sch in chemo   Shayle Donahoo

## 2020-05-26 NOTE — Progress Notes (Signed)
Per dr Marin Olp, okay to treat today despite [place].

## 2020-05-26 NOTE — Progress Notes (Signed)
Patient is doing well today. He states with the extra week break between treatments he feels the best he has in many months. Today is cycle 6/8.   Oncology Nurse Navigator Documentation  Oncology Nurse Navigator Flowsheets 05/26/2020  Abnormal Finding Date -  Diagnosis Status -  Phase of Treatment -  Chemotherapy Actual Start Date: -  Expected Surgery Date -  Surgery Actual Start Date: -  Navigator Follow Up Date: 06/10/2020  Navigator Follow Up Reason: Follow-up Appointment;Chemotherapy  Navigator Location [facility]  Referral Date to RadOnc/MedOnc -  Navigator Encounter Type Treatment;Appt/Treatment Plan Review  Telephone -  Treatment Initiated Date -  Patient Visit Type MedOnc  Treatment Phase Active Tx  Barriers/Navigation Needs Coordination of Care;Education  Education -  Interventions Psycho-Social Support  Acuity Level 2-Minimal Needs (1-2 Barriers Identified)  Referrals -  Coordination of Care -  Education Method -  Support Groups/Services Friends and Family  Time Spent with Patient 30

## 2020-05-28 ENCOUNTER — Other Ambulatory Visit: Payer: Self-pay

## 2020-05-28 ENCOUNTER — Inpatient Hospital Stay: Payer: Medicare Other

## 2020-05-28 VITALS — BP 149/78 | HR 65 | Resp 18

## 2020-05-28 DIAGNOSIS — Z5111 Encounter for antineoplastic chemotherapy: Secondary | ICD-10-CM | POA: Diagnosis not present

## 2020-05-28 DIAGNOSIS — C189 Malignant neoplasm of colon, unspecified: Secondary | ICD-10-CM

## 2020-05-28 MED ORDER — HEPARIN SOD (PORK) LOCK FLUSH 100 UNIT/ML IV SOLN
500.0000 [IU] | Freq: Once | INTRAVENOUS | Status: AC | PRN
  Administered 2020-05-28: 500 [IU]
  Filled 2020-05-28: qty 5

## 2020-05-28 MED ORDER — SODIUM CHLORIDE 0.9% FLUSH
10.0000 mL | INTRAVENOUS | Status: DC | PRN
Start: 2020-05-28 — End: 2020-05-28
  Administered 2020-05-28: 10 mL
  Filled 2020-05-28: qty 10

## 2020-05-28 NOTE — Patient Instructions (Signed)

## 2020-06-10 ENCOUNTER — Inpatient Hospital Stay (HOSPITAL_BASED_OUTPATIENT_CLINIC_OR_DEPARTMENT_OTHER): Payer: Medicare Other | Admitting: Hematology & Oncology

## 2020-06-10 ENCOUNTER — Inpatient Hospital Stay: Payer: Medicare Other

## 2020-06-10 ENCOUNTER — Encounter: Payer: Self-pay | Admitting: Hematology & Oncology

## 2020-06-10 ENCOUNTER — Other Ambulatory Visit: Payer: Self-pay

## 2020-06-10 ENCOUNTER — Encounter: Payer: Self-pay | Admitting: *Deleted

## 2020-06-10 VITALS — BP 137/63 | HR 65 | Temp 97.8°F | Resp 17 | Wt 260.0 lb

## 2020-06-10 DIAGNOSIS — Z5111 Encounter for antineoplastic chemotherapy: Secondary | ICD-10-CM | POA: Diagnosis not present

## 2020-06-10 DIAGNOSIS — C184 Malignant neoplasm of transverse colon: Secondary | ICD-10-CM

## 2020-06-10 DIAGNOSIS — C189 Malignant neoplasm of colon, unspecified: Secondary | ICD-10-CM

## 2020-06-10 LAB — CMP (CANCER CENTER ONLY)
ALT: 16 U/L (ref 0–44)
AST: 20 U/L (ref 15–41)
Albumin: 3.8 g/dL (ref 3.5–5.0)
Alkaline Phosphatase: 75 U/L (ref 38–126)
Anion gap: 10 (ref 5–15)
BUN: 12 mg/dL (ref 8–23)
CO2: 25 mmol/L (ref 22–32)
Calcium: 9.5 mg/dL (ref 8.9–10.3)
Chloride: 105 mmol/L (ref 98–111)
Creatinine: 0.98 mg/dL (ref 0.61–1.24)
GFR, Estimated: >60 mL/min (ref >60)
Glucose, Bld: 96 mg/dL (ref 70–99)
Potassium: 3.7 mmol/L (ref 3.5–5.1)
Sodium: 140 mmol/L (ref 135–145)
Total Bilirubin: 0.8 mg/dL (ref 0.3–1.2)
Total Protein: 6.5 g/dL (ref 6.5–8.1)

## 2020-06-10 LAB — CBC WITH DIFFERENTIAL (CANCER CENTER ONLY)
Abs Immature Granulocytes: 0.01 10*3/uL (ref 0.00–0.07)
Basophils Absolute: 0 10*3/uL (ref 0.0–0.1)
Basophils Relative: 1 %
Eosinophils Absolute: 0 10*3/uL (ref 0.0–0.5)
Eosinophils Relative: 1 %
HCT: 35.3 % — ABNORMAL LOW (ref 39.0–52.0)
Hemoglobin: 11.8 g/dL — ABNORMAL LOW (ref 13.0–17.0)
Immature Granulocytes: 0 %
Lymphocytes Relative: 35 %
Lymphs Abs: 1.4 10*3/uL (ref 0.7–4.0)
MCH: 30.3 pg (ref 26.0–34.0)
MCHC: 33.4 g/dL (ref 30.0–36.0)
MCV: 90.7 fL (ref 80.0–100.0)
Monocytes Absolute: 0.6 10*3/uL (ref 0.1–1.0)
Monocytes Relative: 15 %
Neutro Abs: 2 10*3/uL (ref 1.7–7.7)
Neutrophils Relative %: 48 %
Platelet Count: 111 10*3/uL — ABNORMAL LOW (ref 150–400)
RBC: 3.89 MIL/uL — ABNORMAL LOW (ref 4.22–5.81)
RDW: 19.2 % — ABNORMAL HIGH (ref 11.5–15.5)
WBC Count: 4.1 10*3/uL (ref 4.0–10.5)
nRBC: 0 % (ref 0.0–0.2)

## 2020-06-10 LAB — IRON AND TIBC
Iron: 80 ug/dL (ref 42–163)
Saturation Ratios: 21 % (ref 20–55)
TIBC: 388 ug/dL (ref 202–409)
UIBC: 308 ug/dL (ref 117–376)

## 2020-06-10 LAB — FERRITIN: Ferritin: 226 ng/mL (ref 24–336)

## 2020-06-10 MED ORDER — DEXTROSE 5 % IV SOLN
Freq: Once | INTRAVENOUS | Status: AC
  Filled 2020-06-10: qty 250

## 2020-06-10 MED ORDER — PALONOSETRON HCL INJECTION 0.25 MG/5ML
0.2500 mg | Freq: Once | INTRAVENOUS | Status: AC
  Administered 2020-06-10: 0.25 mg via INTRAVENOUS

## 2020-06-10 MED ORDER — FLUOROURACIL CHEMO INJECTION 2.5 GM/50ML
400.0000 mg/m2 | Freq: Once | INTRAVENOUS | Status: AC
  Administered 2020-06-10: 1000 mg via INTRAVENOUS
  Filled 2020-06-10: qty 20

## 2020-06-10 MED ORDER — SODIUM CHLORIDE 0.9 % IV SOLN
10.0000 mg | Freq: Once | INTRAVENOUS | Status: AC
  Administered 2020-06-10: 10 mg via INTRAVENOUS
  Filled 2020-06-10: qty 10

## 2020-06-10 MED ORDER — LEUCOVORIN CALCIUM INJECTION 350 MG
394.0000 mg/m2 | Freq: Once | INTRAVENOUS | Status: AC
  Administered 2020-06-10: 1000 mg via INTRAVENOUS
  Filled 2020-06-10: qty 50

## 2020-06-10 MED ORDER — OXALIPLATIN CHEMO INJECTION 100 MG/20ML
79.0000 mg/m2 | Freq: Once | INTRAVENOUS | Status: AC
  Administered 2020-06-10: 200 mg via INTRAVENOUS
  Filled 2020-06-10: qty 40

## 2020-06-10 MED ORDER — DEXTROSE 5 % IV SOLN
Freq: Once | INTRAVENOUS | Status: AC
Start: 2020-06-10 — End: 2020-06-10
  Filled 2020-06-10: qty 250

## 2020-06-10 MED ORDER — SODIUM CHLORIDE 0.9 % IV SOLN
2400.0000 mg/m2 | INTRAVENOUS | Status: DC
  Administered 2020-06-10: 6100 mg via INTRAVENOUS
  Filled 2020-06-10: qty 122

## 2020-06-10 MED ORDER — PALONOSETRON HCL INJECTION 0.25 MG/5ML
INTRAVENOUS | Status: AC
  Filled 2020-06-10: qty 5

## 2020-06-10 NOTE — Progress Notes (Signed)
Oncology Nurse Navigator Documentation  Oncology Nurse Navigator Flowsheets 06/10/2020  Abnormal Finding Date -  Diagnosis Status -  Phase of Treatment Chemo  Chemotherapy Actual Start Date: -  Chemotherapy Expected End Date: 06/23/2020  Expected Surgery Date -  Surgery Actual Start Date: -  Navigator Follow Up Date: 06/23/2020  Navigator Follow Up Reason: Follow-up Appointment;Chemotherapy  Navigator Location [facility]  Referral Date to RadOnc/MedOnc -  Navigator Encounter Type Treatment;Appt/Treatment Plan Review  Telephone -  Treatment Initiated Date -  Patient Visit Type MedOnc  Treatment Phase Active Tx  Barriers/Navigation Needs Coordination of Care;Education  Education -  Interventions Psycho-Social Support  Acuity Level 2-Minimal Needs (1-2 Barriers Identified)  Referrals -  Coordination of Care -  Education Method -  Support Groups/Services Friends and Family  Time Spent with Patient 15

## 2020-06-10 NOTE — Patient Instructions (Signed)
Implanted Port Insertion, Care After This sheet gives you information about how to care for yourself after your procedure. Your health care provider may also give you more specific instructions. If you have problems or questions, contact your health care provider. What can I expect after the procedure? After the procedure, it is common to have:  Discomfort at the port insertion site.  Bruising on the skin over the port. This should improve over 3-4 days. Follow these instructions at home: Port care  After your port is placed, you will get a manufacturer's information card. The card has information about your port. Keep this card with you at all times.  Take care of the port as told by your health care provider. Ask your health care provider if you or a family member can get training for taking care of the port at home. A home health care nurse may also take care of the port.  Make sure to remember what type of port you have. Incision care  Follow instructions from your health care provider about how to take care of your port insertion site. Make sure you: ? Wash your hands with soap and water before and after you change your bandage (dressing). If soap and water are not available, use hand sanitizer. ? Change your dressing as told by your health care provider. ? Leave stitches (sutures), skin glue, or adhesive strips in place. These skin closures may need to stay in place for 2 weeks or longer. If adhesive strip edges start to loosen and curl up, you may trim the loose edges. Do not remove adhesive strips completely unless your health care provider tells you to do that.  Check your port insertion site every day for signs of infection. Check for: ? Redness, swelling, or pain. ? Fluid or blood. ? Warmth. ? Pus or a bad smell.      Activity  Return to your normal activities as told by your health care provider. Ask your health care provider what activities are safe for you.  Do not  lift anything that is heavier than 10 lb (4.5 kg), or the limit that you are told, until your health care provider says that it is safe. General instructions  Take over-the-counter and prescription medicines only as told by your health care provider.  Do not take baths, swim, or use a hot tub until your health care provider approves. Ask your health care provider if you may take showers. You may only be allowed to take sponge baths.  Do not drive for 24 hours if you were given a sedative during your procedure.  Wear a medical alert bracelet in case of an emergency. This will tell any health care providers that you have a port.  Keep all follow-up visits as told by your health care provider. This is important. Contact a health care provider if:  You cannot flush your port with saline as directed, or you cannot draw blood from the port.  You have a fever or chills.  You have redness, swelling, or pain around your port insertion site.  You have fluid or blood coming from your port insertion site.  Your port insertion site feels warm to the touch.  You have pus or a bad smell coming from the port insertion site. Get help right away if:  You have chest pain or shortness of breath.  You have bleeding from your port that you cannot control. Summary  Take care of the port as told by your   health care provider. Keep the manufacturer's information card with you at all times.  Change your dressing as told by your health care provider.  Contact a health care provider if you have a fever or chills or if you have redness, swelling, or pain around your port insertion site.  Keep all follow-up visits as told by your health care provider. This information is not intended to replace advice given to you by your health care provider. Make sure you discuss any questions you have with your health care provider. Document Revised: 08/29/2017 Document Reviewed: 08/29/2017 Elsevier Patient Education   2021 Elsevier Inc.  

## 2020-06-10 NOTE — Progress Notes (Signed)
Hematology and Oncology Follow Up Visit  John [name] [id number] 1955/10/12 65 y.o. 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  Overall, he is doing nicely.  He has had no problems with nausea or vomiting.  He has had no problems with diarrhea.  He has had no mouth sores.  There is been no cough or shortness of breath.  There is been no leg swelling.  He has had no rashes.  He has had no headache.  Neuropathies does not seem to be much of a problem.  He does have the cold sensation for a few days but this does get better.  He has had no problems with mouth sores.  Overall, his performance status is ECOG 1.   Medications:  Current Outpatient Medications:  .  cetirizine (ZYRTEC) 10 MG chewable tablet, Chew 10 mg by mouth daily., Disp: , Rfl:  .  dexamethasone (DECADRON) 4 MG tablet, Take 2 tablets (8 mg total) by mouth daily. Start the day after chemotherapy for 2 days. Take with food., Disp: 30 tablet, Rfl: 1 .  lidocaine-prilocaine (EMLA) cream, Apply to affected area once (Patient not taking: No sig reported), Disp: 30 g, Rfl: 3 .  ondansetron (ZOFRAN) 8 MG tablet, Take 1 tablet (8 mg total) by mouth 2 (two) times daily as needed for refractory nausea / vomiting. Start on day 3 after chemotherapy. (Patient not taking: Reported on 05/26/2020), Disp: 30 tablet, Rfl: 1 .  pantoprazole (PROTONIX) 40 MG tablet, TAKE 1 TABLET BY MOUTH TWICE DAILY, Disp: 90 tablet, Rfl: 3 .  prochlorperazine (COMPAZINE) 10 MG tablet, TAKE 1 TABLET(10 MG) BY MOUTH EVERY 6 HOURS AS NEEDED FOR NAUSEA OR VOMITING (Patient not taking: Reported on 05/26/2020), Disp: 30 tablet, Rfl: 1 .  telmisartan (MICARDIS) 40 MG tablet, Take 40 mg by mouth daily., Disp: ,  Rfl:   Allergies:  Allergies  Allergen Reactions  . Erythromycin     Upset stomach   . Lisinopril Cough  . Other Swelling    Bee Sting     Past Medical History, Surgical history, Social history, and Family History were reviewed and updated.  Review of Systems: Review of Systems  Constitutional: Negative.   HENT:  Negative.  Negative for voice change.   Eyes: Negative.   Respiratory: Negative.   Cardiovascular: Negative.   Gastrointestinal: Negative.   Endocrine: Negative.   Genitourinary: Negative.    Musculoskeletal: Negative.   Skin: Negative.   Neurological: Negative.   Hematological: Negative.   Psychiatric/Behavioral: Negative.     Physical Exam:  weight is 260 lb (117.9 kg). His oral temperature is 97.8 F (36.6 C). His blood pressure is 137/63 and his pulse is 65. His respiration is 17 and oxygen saturation is 99%.   Wt Readings from Last 3 Encounters:  06/10/20 260 lb (117.9 kg)  05/26/20 262 lb 6.4 oz (119 kg)  05/05/20 257 lb 8 oz (116.8 kg)    Physical Exam Vitals reviewed.  HENT:     Head: Normocephalic and atraumatic.  Eyes:     Pupils: Pupils are equal, round, and reactive to light.  Cardiovascular:     Rate and Rhythm: Normal rate and regular rhythm.     Heart sounds: Normal heart sounds.  Pulmonary:  Effort: Pulmonary effort is normal.     Breath sounds: Normal breath sounds.  Abdominal:     General: Bowel sounds are normal.     Palpations: Abdomen is soft.     Comments: Abdominal exam shows well-healed laparoscopy scars.  He has no abdominal distention.  He has good bowel sounds.  There is no fluid wave.  There is no palpable liver or spleen tip.  Musculoskeletal:        General: No tenderness or deformity. Normal range of motion.     Cervical back: Normal range of motion.  Lymphadenopathy:     Cervical: No cervical adenopathy.  Skin:    General: Skin is warm and dry.     Findings: No erythema or rash.  Neurological:     Mental  Status: He is alert and oriented to person, place, and time.  Psychiatric:        Behavior: Behavior normal.        Thought Content: Thought content normal.        Judgment: Judgment normal.      Lab Results  Component Value Date   WBC 4.1 06/10/2020   HGB 11.8 (L) 06/10/2020   HCT 35.3 (L) 06/10/2020   MCV 90.7 06/10/2020   PLT 111 (L) 06/10/2020     Chemistry      Component Value Date/Time   NA 140 06/10/2020 0920   K 3.7 06/10/2020 0920   CL 105 06/10/2020 0920   CO2 25 06/10/2020 0920   BUN 12 06/10/2020 0920   CREATININE 0.98 06/10/2020 0920   CREATININE 1.14 12/04/2019 1310      Component Value Date/Time   CALCIUM 9.5 06/10/2020 0920   ALKPHOS 75 06/10/2020 0920   AST 20 06/10/2020 0920   ALT 16 06/10/2020 0920   BILITOT 0.8 06/10/2020 0920      Impression and Plan: John [name] is a a very nice 65 year old white male.  He has stage IIIb sigmoid colon cancer.  I think he is doing well.  This was seventh cycle of treatment.  After this cycle he will have 1 final cycle.  After his eighth and final cycle, we will plan for a follow-up CT scan in about 2 months.  After that, we would then do scans every 4 months for 2 years.    He is having more problems with his knees.  He sounds like is going need to have knee surgery.  It sounds like this might be in the late fall.  If this is the case, I certainly would have no problems with this.  I would not think that he would have any issues with healing.     Volanda Napoleon, MD 4/27/202211:00 AM

## 2020-06-10 NOTE — Patient Instructions (Signed)
Fluorouracil, 5-FU injection What is this medicine? FLUOROURACIL, 5-FU (flure oh YOOR a sil) is a chemotherapy drug. It slows the growth of cancer cells. This medicine is used to treat many types of cancer like breast cancer, colon or rectal cancer, pancreatic cancer, and stomach cancer. This medicine may be used for other purposes; ask your health care provider or pharmacist if you have questions. COMMON BRAND NAME(S): Adrucil What should I tell my health care provider before I take this medicine? They need to know if you have any of these conditions:  blood disorders  dihydropyrimidine dehydrogenase (DPD) deficiency  infection (especially a virus infection such as chickenpox, cold sores, or herpes)  kidney disease  liver disease  malnourished, poor nutrition  recent or ongoing radiation therapy  an unusual or allergic reaction to fluorouracil, other chemotherapy, other medicines, foods, dyes, or preservatives  pregnant or trying to get pregnant  breast-feeding How should I use this medicine? This drug is given as an infusion or injection into a vein. It is administered in a hospital or clinic by a specially trained health care professional. Talk to your pediatrician regarding the use of this medicine in children. Special care may be needed. Overdosage: If you think you have taken too much of this medicine contact a poison control center or emergency room at once. NOTE: This medicine is only for you. Do not share this medicine with others. What if I miss a dose? It is important not to miss your dose. Call your doctor or health care professional if you are unable to keep an appointment. What may interact with this medicine? Do not take this medicine with any of the following medications:  live virus vaccines This medicine may also interact with the following medications:  medicines that treat or prevent blood clots like warfarin, enoxaparin, and dalteparin This list may not  describe all possible interactions. Give your health care provider a list of all the medicines, herbs, non-prescription drugs, or dietary supplements you use. Also tell them if you smoke, drink alcohol, or use illegal drugs. Some items may interact with your medicine. What should I watch for while using this medicine? Visit your doctor for checks on your progress. This drug may make you feel generally unwell. This is not uncommon, as chemotherapy can affect healthy cells as well as cancer cells. Report any side effects. Continue your course of treatment even though you feel ill unless your doctor tells you to stop. In some cases, you may be given additional medicines to help with side effects. Follow all directions for their use. Call your doctor or health care professional for advice if you get a fever, chills or sore throat, or other symptoms of a cold or flu. Do not treat yourself. This drug decreases your body's ability to fight infections. Try to avoid being around people who are sick. This medicine may increase your risk to bruise or bleed. Call your doctor or health care professional if you notice any unusual bleeding. Be careful brushing and flossing your teeth or using a toothpick because you may get an infection or bleed more easily. If you have any dental work done, tell your dentist you are receiving this medicine. Avoid taking products that contain aspirin, acetaminophen, ibuprofen, naproxen, or ketoprofen unless instructed by your doctor. These medicines may hide a fever. Do not become pregnant while taking this medicine. Women should inform their doctor if they wish to become pregnant or think they might be pregnant. There is a potential   for serious side effects to an unborn child. Talk to your health care professional or pharmacist for more information. Do not breast-feed an infant while taking this medicine. Men should inform their doctor if they wish to father a child. This medicine may  lower sperm counts. Do not treat diarrhea with over the counter products. Contact your doctor if you have diarrhea that lasts more than 2 days or if it is severe and watery. This medicine can make you more sensitive to the sun. Keep out of the sun. If you cannot avoid being in the sun, wear protective clothing and use sunscreen. Do not use sun lamps or tanning beds/booths. What side effects may I notice from receiving this medicine? Side effects that you should report to your doctor or health care professional as soon as possible:  allergic reactions like skin rash, itching or hives, swelling of the face, lips, or tongue  low blood counts - this medicine may decrease the number of white blood cells, red blood cells and platelets. You may be at increased risk for infections and bleeding.  signs of infection - fever or chills, cough, sore throat, pain or difficulty passing urine  signs of decreased platelets or bleeding - bruising, pinpoint red spots on the skin, black, tarry stools, blood in the urine  signs of decreased red blood cells - unusually weak or tired, fainting spells, lightheadedness  breathing problems  changes in vision  chest pain  mouth sores  nausea and vomiting  pain, swelling, redness at site where injected  pain, tingling, numbness in the hands or feet  redness, swelling, or sores on hands or feet  stomach pain  unusual bleeding Side effects that usually do not require medical attention (report to your doctor or health care professional if they continue or are bothersome):  changes in finger or toe nails  diarrhea  dry or itchy skin  hair loss  headache  loss of appetite  sensitivity of eyes to the light  stomach upset  unusually teary eyes This list may not describe all possible side effects. Call your doctor for medical advice about side effects. You may report side effects to FDA at 1-800-FDA-1088. Where should I keep my medicine? This  drug is given in a hospital or clinic and will not be stored at home. NOTE: This sheet is a summary. It may not cover all possible information. If you have questions about this medicine, talk to your doctor, pharmacist, or health care provider.  2021 Elsevier/Gold Standard (2019-01-01 15:00:03) Leucovorin injection What is this medicine? LEUCOVORIN (loo koe VOR in) is used to prevent or treat the harmful effects of some medicines. This medicine is used to treat anemia caused by a low amount of folic acid in the body. It is also used with 5-fluorouracil (5-FU) to treat colon cancer. This medicine may be used for other purposes; ask your health care provider or pharmacist if you have questions. What should I tell my health care provider before I take this medicine? They need to know if you have any of these conditions:  anemia from low levels of vitamin B-12 in the blood  an unusual or allergic reaction to leucovorin, folic acid, other medicines, foods, dyes, or preservatives  pregnant or trying to get pregnant  breast-feeding How should I use this medicine? This medicine is for injection into a muscle or into a vein. It is given by a health care professional in a hospital or clinic setting. Talk to your pediatrician   regarding the use of this medicine in children. Special care may be needed. Overdosage: If you think you have taken too much of this medicine contact a poison control center or emergency room at once. NOTE: This medicine is only for you. Do not share this medicine with others. What if I miss a dose? This does not apply. What may interact with this medicine?  capecitabine  fluorouracil  phenobarbital  phenytoin  primidone  trimethoprim-sulfamethoxazole This list may not describe all possible interactions. Give your health care provider a list of all the medicines, herbs, non-prescription drugs, or dietary supplements you use. Also tell them if you smoke, drink alcohol,  or use illegal drugs. Some items may interact with your medicine. What should I watch for while using this medicine? Your condition will be monitored carefully while you are receiving this medicine. This medicine may increase the side effects of 5-fluorouracil, 5-FU. Tell your doctor or health care professional if you have diarrhea or mouth sores that do not get better or that get worse. What side effects may I notice from receiving this medicine? Side effects that you should report to your doctor or health care professional as soon as possible:  allergic reactions like skin rash, itching or hives, swelling of the face, lips, or tongue  breathing problems  fever, infection  mouth sores  unusual bleeding or bruising  unusually weak or tired Side effects that usually do not require medical attention (report to your doctor or health care professional if they continue or are bothersome):  constipation or diarrhea  loss of appetite  nausea, vomiting This list may not describe all possible side effects. Call your doctor for medical advice about side effects. You may report side effects to FDA at 1-800-FDA-1088. Where should I keep my medicine? This drug is given in a hospital or clinic and will not be stored at home. NOTE: This sheet is a summary. It may not cover all possible information. If you have questions about this medicine, talk to your doctor, pharmacist, or health care provider.  2021 Elsevier/Gold Standard (2007-08-07 16:50:29) Oxaliplatin Injection What is this medicine? OXALIPLATIN (ox AL i PLA tin) is a chemotherapy drug. It targets fast dividing cells, like cancer cells, and causes these cells to die. This medicine is used to treat cancers of the colon and rectum, and many other cancers. This medicine may be used for other purposes; ask your health care provider or pharmacist if you have questions. COMMON BRAND NAME(S): Eloxatin What should I tell my health care provider  before I take this medicine? They need to know if you have any of these conditions:  heart disease  history of irregular heartbeat  liver disease  low blood counts, like white cells, platelets, or red blood cells  lung or breathing disease, like asthma  take medicines that treat or prevent blood clots  tingling of the fingers or toes, or other nerve disorder  an unusual or allergic reaction to oxaliplatin, other chemotherapy, other medicines, foods, dyes, or preservatives  pregnant or trying to get pregnant  breast-feeding How should I use this medicine? This drug is given as an infusion into a vein. It is administered in a hospital or clinic by a specially trained health care professional. Talk to your pediatrician regarding the use of this medicine in children. Special care may be needed. Overdosage: If you think you have taken too much of this medicine contact a poison control center or emergency room at once. NOTE: This medicine   is only for you. Do not share this medicine with others. What if I miss a dose? It is important not to miss a dose. Call your doctor or health care professional if you are unable to keep an appointment. What may interact with this medicine? Do not take this medicine with any of the following medications:  cisapride  dronedarone  pimozide  thioridazine This medicine may also interact with the following medications:  aspirin and aspirin-like medicines  certain medicines that treat or prevent blood clots like warfarin, apixaban, dabigatran, and rivaroxaban  cisplatin  cyclosporine  diuretics  medicines for infection like acyclovir, adefovir, amphotericin B, bacitracin, cidofovir, foscarnet, ganciclovir, gentamicin, pentamidine, vancomycin  NSAIDs, medicines for pain and inflammation, like ibuprofen or naproxen  other medicines that prolong the QT interval (an abnormal heart rhythm)  pamidronate  zoledronic acid This list may not  describe all possible interactions. Give your health care provider a list of all the medicines, herbs, non-prescription drugs, or dietary supplements you use. Also tell them if you smoke, drink alcohol, or use illegal drugs. Some items may interact with your medicine. What should I watch for while using this medicine? Your condition will be monitored carefully while you are receiving this medicine. You may need blood work done while you are taking this medicine. This medicine may make you feel generally unwell. This is not uncommon as chemotherapy can affect healthy cells as well as cancer cells. Report any side effects. Continue your course of treatment even though you feel ill unless your healthcare professional tells you to stop. This medicine can make you more sensitive to cold. Do not drink cold drinks or use ice. Cover exposed skin before coming in contact with cold temperatures or cold objects. When out in cold weather wear warm clothing and cover your mouth and nose to warm the air that goes into your lungs. Tell your doctor if you get sensitive to the cold. Do not become pregnant while taking this medicine or for 9 months after stopping it. Women should inform their health care professional if they wish to become pregnant or think they might be pregnant. Men should not father a child while taking this medicine and for 6 months after stopping it. There is potential for serious side effects to an unborn child. Talk to your health care professional for more information. Do not breast-feed a child while taking this medicine or for 3 months after stopping it. This medicine has caused ovarian failure in some women. This medicine may make it more difficult to get pregnant. Talk to your health care professional if you are concerned about your fertility. This medicine has caused decreased sperm counts in some men. This may make it more difficult to father a child. Talk to your health care professional if  you are concerned about your fertility. This medicine may increase your risk of getting an infection. Call your health care professional for advice if you get a fever, chills, or sore throat, or other symptoms of a cold or flu. Do not treat yourself. Try to avoid being around people who are sick. Avoid taking medicines that contain aspirin, acetaminophen, ibuprofen, naproxen, or ketoprofen unless instructed by your health care professional. These medicines may hide a fever. Be careful brushing or flossing your teeth or using a toothpick because you may get an infection or bleed more easily. If you have any dental work done, tell your dentist you are receiving this medicine. What side effects may I notice from receiving   this medicine? Side effects that you should report to your doctor or health care professional as soon as possible:  allergic reactions like skin rash, itching or hives, swelling of the face, lips, or tongue  breathing problems  cough  low blood counts - this medicine may decrease the number of white blood cells, red blood cells, and platelets. You may be at increased risk for infections and bleeding  nausea, vomiting  pain, redness, or irritation at site where injected  pain, tingling, numbness in the hands or feet  signs and symptoms of bleeding such as bloody or black, tarry stools; red or dark brown urine; spitting up blood or brown material that looks like coffee grounds; red spots on the skin; unusual bruising or bleeding from the eyes, gums, or nose  signs and symptoms of a dangerous change in heartbeat or heart rhythm like chest pain; dizziness; fast, irregular heartbeat; palpitations; feeling faint or lightheaded; falls  signs and symptoms of infection like fever; chills; cough; sore throat; pain or trouble passing urine  signs and symptoms of liver injury like dark yellow or brown urine; general ill feeling or flu-like symptoms; light-colored stools; loss of  appetite; nausea; right upper belly pain; unusually weak or tired; yellowing of the eyes or skin  signs and symptoms of low red blood cells or anemia such as unusually weak or tired; feeling faint or lightheaded; falls  signs and symptoms of muscle injury like dark urine; trouble passing urine or change in the amount of urine; unusually weak or tired; muscle pain; back pain Side effects that usually do not require medical attention (report to your doctor or health care professional if they continue or are bothersome):  changes in taste  diarrhea  gas  hair loss  loss of appetite  mouth sores This list may not describe all possible side effects. Call your doctor for medical advice about side effects. You may report side effects to FDA at 1-800-FDA-1088. Where should I keep my medicine? This drug is given in a hospital or clinic and will not be stored at home. NOTE: This sheet is a summary. It may not cover all possible information. If you have questions about this medicine, talk to your doctor, pharmacist, or health care provider.  2021 Elsevier/Gold Standard (2018-06-20 12:20:35)  

## 2020-06-12 ENCOUNTER — Inpatient Hospital Stay: Payer: Medicare Other

## 2020-06-12 ENCOUNTER — Other Ambulatory Visit: Payer: Self-pay

## 2020-06-12 VITALS — BP 146/64 | HR 63 | Temp 97.1°F | Resp 16

## 2020-06-12 DIAGNOSIS — Z5111 Encounter for antineoplastic chemotherapy: Secondary | ICD-10-CM | POA: Diagnosis not present

## 2020-06-12 DIAGNOSIS — C189 Malignant neoplasm of colon, unspecified: Secondary | ICD-10-CM

## 2020-06-12 MED ORDER — SODIUM CHLORIDE 0.9% FLUSH
10.0000 mL | INTRAVENOUS | Status: DC | PRN
  Administered 2020-06-12: 10 mL
  Filled 2020-06-12: qty 10

## 2020-06-12 MED ORDER — HEPARIN SOD (PORK) LOCK FLUSH 100 UNIT/ML IV SOLN
500.0000 [IU] | Freq: Once | INTRAVENOUS | Status: AC | PRN
  Administered 2020-06-12: 500 [IU]
  Filled 2020-06-12: qty 5

## 2020-06-12 NOTE — Patient Instructions (Signed)
Fluorouracil, 5-FU injection What is this medicine? FLUOROURACIL, 5-FU (flure oh YOOR a sil) is a chemotherapy drug. It slows the growth of cancer cells. This medicine is used to treat many types of cancer like breast cancer, colon or rectal cancer, pancreatic cancer, and stomach cancer. This medicine may be used for other purposes; ask your health care provider or pharmacist if you have questions. COMMON BRAND NAME(S): Adrucil What should I tell my health care provider before I take this medicine? They need to know if you have any of these conditions:  blood disorders  dihydropyrimidine dehydrogenase (DPD) deficiency  infection (especially a virus infection such as chickenpox, cold sores, or herpes)  kidney disease  liver disease  malnourished, poor nutrition  recent or ongoing radiation therapy  an unusual or allergic reaction to fluorouracil, other chemotherapy, other medicines, foods, dyes, or preservatives  pregnant or trying to get pregnant  breast-feeding How should I use this medicine? This drug is given as an infusion or injection into a vein. It is administered in a hospital or clinic by a specially trained health care professional. Talk to your pediatrician regarding the use of this medicine in children. Special care may be needed. Overdosage: If you think you have taken too much of this medicine contact a poison control center or emergency room at once. NOTE: This medicine is only for you. Do not share this medicine with others. What if I miss a dose? It is important not to miss your dose. Call your doctor or health care professional if you are unable to keep an appointment. What may interact with this medicine? Do not take this medicine with any of the following medications:  live virus vaccines This medicine may also interact with the following medications:  medicines that treat or prevent blood clots like warfarin, enoxaparin, and dalteparin This list may not  describe all possible interactions. Give your health care provider a list of all the medicines, herbs, non-prescription drugs, or dietary supplements you use. Also tell them if you smoke, drink alcohol, or use illegal drugs. Some items may interact with your medicine. What should I watch for while using this medicine? Visit your doctor for checks on your progress. This drug may make you feel generally unwell. This is not uncommon, as chemotherapy can affect healthy cells as well as cancer cells. Report any side effects. Continue your course of treatment even though you feel ill unless your doctor tells you to stop. In some cases, you may be given additional medicines to help with side effects. Follow all directions for their use. Call your doctor or health care professional for advice if you get a fever, chills or sore throat, or other symptoms of a cold or flu. Do not treat yourself. This drug decreases your body's ability to fight infections. Try to avoid being around people who are sick. This medicine may increase your risk to bruise or bleed. Call your doctor or health care professional if you notice any unusual bleeding. Be careful brushing and flossing your teeth or using a toothpick because you may get an infection or bleed more easily. If you have any dental work done, tell your dentist you are receiving this medicine. Avoid taking products that contain aspirin, acetaminophen, ibuprofen, naproxen, or ketoprofen unless instructed by your doctor. These medicines may hide a fever. Do not become pregnant while taking this medicine. Women should inform their doctor if they wish to become pregnant or think they might be pregnant. There is a potential   for serious side effects to an unborn child. Talk to your health care professional or pharmacist for more information. Do not breast-feed an infant while taking this medicine. Men should inform their doctor if they wish to father a child. This medicine may  lower sperm counts. Do not treat diarrhea with over the counter products. Contact your doctor if you have diarrhea that lasts more than 2 days or if it is severe and watery. This medicine can make you more sensitive to the sun. Keep out of the sun. If you cannot avoid being in the sun, wear protective clothing and use sunscreen. Do not use sun lamps or tanning beds/booths. What side effects may I notice from receiving this medicine? Side effects that you should report to your doctor or health care professional as soon as possible:  allergic reactions like skin rash, itching or hives, swelling of the face, lips, or tongue  low blood counts - this medicine may decrease the number of white blood cells, red blood cells and platelets. You may be at increased risk for infections and bleeding.  signs of infection - fever or chills, cough, sore throat, pain or difficulty passing urine  signs of decreased platelets or bleeding - bruising, pinpoint red spots on the skin, black, tarry stools, blood in the urine  signs of decreased red blood cells - unusually weak or tired, fainting spells, lightheadedness  breathing problems  changes in vision  chest pain  mouth sores  nausea and vomiting  pain, swelling, redness at site where injected  pain, tingling, numbness in the hands or feet  redness, swelling, or sores on hands or feet  stomach pain  unusual bleeding Side effects that usually do not require medical attention (report to your doctor or health care professional if they continue or are bothersome):  changes in finger or toe nails  diarrhea  dry or itchy skin  hair loss  headache  loss of appetite  sensitivity of eyes to the light  stomach upset  unusually teary eyes This list may not describe all possible side effects. Call your doctor for medical advice about side effects. You may report side effects to FDA at 1-800-FDA-1088. Where should I keep my medicine? This  drug is given in a hospital or clinic and will not be stored at home. NOTE: This sheet is a summary. It may not cover all possible information. If you have questions about this medicine, talk to your doctor, pharmacist, or health care provider.  2021 Elsevier/Gold Standard (2019-01-01 15:00:03)

## 2020-06-23 ENCOUNTER — Inpatient Hospital Stay: Payer: Medicare Other | Attending: Hematology & Oncology

## 2020-06-23 ENCOUNTER — Encounter: Payer: Self-pay | Admitting: *Deleted

## 2020-06-23 ENCOUNTER — Telehealth: Payer: Self-pay

## 2020-06-23 ENCOUNTER — Inpatient Hospital Stay (HOSPITAL_BASED_OUTPATIENT_CLINIC_OR_DEPARTMENT_OTHER): Payer: Medicare Other | Admitting: Hematology & Oncology

## 2020-06-23 ENCOUNTER — Other Ambulatory Visit: Payer: Self-pay

## 2020-06-23 ENCOUNTER — Inpatient Hospital Stay: Payer: Medicare Other

## 2020-06-23 ENCOUNTER — Encounter: Payer: Self-pay | Admitting: Hematology & Oncology

## 2020-06-23 VITALS — BP 137/70 | HR 80 | Temp 98.1°F | Resp 17 | Ht 77.0 in | Wt 260.0 lb

## 2020-06-23 DIAGNOSIS — C185 Malignant neoplasm of splenic flexure: Secondary | ICD-10-CM | POA: Diagnosis not present

## 2020-06-23 DIAGNOSIS — C184 Malignant neoplasm of transverse colon: Secondary | ICD-10-CM

## 2020-06-23 DIAGNOSIS — C187 Malignant neoplasm of sigmoid colon: Secondary | ICD-10-CM | POA: Insufficient documentation

## 2020-06-23 DIAGNOSIS — C189 Malignant neoplasm of colon, unspecified: Secondary | ICD-10-CM

## 2020-06-23 DIAGNOSIS — Z5111 Encounter for antineoplastic chemotherapy: Secondary | ICD-10-CM | POA: Diagnosis not present

## 2020-06-23 LAB — CMP (CANCER CENTER ONLY)
ALT: 21 U/L (ref 0–44)
AST: 26 U/L (ref 15–41)
Albumin: 3.7 g/dL (ref 3.5–5.0)
Alkaline Phosphatase: 78 U/L (ref 38–126)
Anion gap: 10 (ref 5–15)
BUN: 9 mg/dL (ref 8–23)
CO2: 24 mmol/L (ref 22–32)
Calcium: 9.3 mg/dL (ref 8.9–10.3)
Chloride: 104 mmol/L (ref 98–111)
Creatinine: 0.98 mg/dL (ref 0.61–1.24)
GFR, Estimated: >60 mL/min (ref >60)
Glucose, Bld: 93 mg/dL (ref 70–99)
Potassium: 3.7 mmol/L (ref 3.5–5.1)
Sodium: 138 mmol/L (ref 135–145)
Total Bilirubin: 0.6 mg/dL (ref 0.3–1.2)
Total Protein: 6.1 g/dL — ABNORMAL LOW (ref 6.5–8.1)

## 2020-06-23 LAB — CBC WITH DIFFERENTIAL (CANCER CENTER ONLY)
Abs Immature Granulocytes: 0.01 10*3/uL (ref 0.00–0.07)
Basophils Absolute: 0 10*3/uL (ref 0.0–0.1)
Basophils Relative: 1 %
Eosinophils Absolute: 0 10*3/uL (ref 0.0–0.5)
Eosinophils Relative: 1 %
HCT: 34.7 % — ABNORMAL LOW (ref 39.0–52.0)
Hemoglobin: 11.5 g/dL — ABNORMAL LOW (ref 13.0–17.0)
Immature Granulocytes: 0 %
Lymphocytes Relative: 41 %
Lymphs Abs: 1.5 10*3/uL (ref 0.7–4.0)
MCH: 30.3 pg (ref 26.0–34.0)
MCHC: 33.1 g/dL (ref 30.0–36.0)
MCV: 91.6 fL (ref 80.0–100.0)
Monocytes Absolute: 0.6 10*3/uL (ref 0.1–1.0)
Monocytes Relative: 16 %
Neutro Abs: 1.5 10*3/uL — ABNORMAL LOW (ref 1.7–7.7)
Neutrophils Relative %: 41 %
Platelet Count: 115 10*3/uL — ABNORMAL LOW (ref 150–400)
RBC: 3.79 MIL/uL — ABNORMAL LOW (ref 4.22–5.81)
RDW: 17.9 % — ABNORMAL HIGH (ref 11.5–15.5)
WBC Count: 3.7 10*3/uL — ABNORMAL LOW (ref 4.0–10.5)
nRBC: 0 % (ref 0.0–0.2)

## 2020-06-23 LAB — CEA (IN HOUSE-CHCC): CEA (CHCC-In House): <1 ng/mL (ref 0.00–5.00)

## 2020-06-23 MED ORDER — SODIUM CHLORIDE 0.9 % IV SOLN
2400.0000 mg/m2 | INTRAVENOUS | Status: DC
  Administered 2020-06-23: 6100 mg via INTRAVENOUS
  Filled 2020-06-23: qty 122

## 2020-06-23 MED ORDER — DEXTROSE 5 % IV SOLN
Freq: Once | INTRAVENOUS | Status: AC
  Filled 2020-06-23: qty 250

## 2020-06-23 MED ORDER — LEUCOVORIN CALCIUM INJECTION 350 MG
400.0000 mg/m2 | Freq: Once | INTRAVENOUS | Status: AC
  Administered 2020-06-23: 1016 mg via INTRAVENOUS
  Filled 2020-06-23: qty 50

## 2020-06-23 MED ORDER — PALONOSETRON HCL INJECTION 0.25 MG/5ML
INTRAVENOUS | Status: AC
  Filled 2020-06-23: qty 5

## 2020-06-23 MED ORDER — FLUOROURACIL CHEMO INJECTION 2.5 GM/50ML
400.0000 mg/m2 | Freq: Once | INTRAVENOUS | Status: AC
  Administered 2020-06-23: 1000 mg via INTRAVENOUS
  Filled 2020-06-23: qty 20

## 2020-06-23 MED ORDER — PALONOSETRON HCL INJECTION 0.25 MG/5ML
0.2500 mg | Freq: Once | INTRAVENOUS | Status: AC
  Administered 2020-06-23: 0.25 mg via INTRAVENOUS

## 2020-06-23 MED ORDER — OXALIPLATIN CHEMO INJECTION 100 MG/20ML
79.0000 mg/m2 | Freq: Once | INTRAVENOUS | Status: AC
  Administered 2020-06-23: 200 mg via INTRAVENOUS
  Filled 2020-06-23: qty 40

## 2020-06-23 MED ORDER — SODIUM CHLORIDE 0.9 % IV SOLN
10.0000 mg | Freq: Once | INTRAVENOUS | Status: AC
  Administered 2020-06-23: 10 mg via INTRAVENOUS
  Filled 2020-06-23: qty 10

## 2020-06-23 NOTE — Telephone Encounter (Signed)
appts made per 06/23/20 los, pt to gain sch in tx/avs and throuogh [employer]

## 2020-06-23 NOTE — Patient Instructions (Signed)
[facility]  Discharge Instructions: Thank you for choosing [facility] to provide your oncology and hematology care.   If you have a lab appointment with the [facility], please go directly to the [facility] and check in at the registration area.  Wear comfortable clothing and clothing appropriate for easy access to any Portacath or PICC line.   We strive to give you quality time with your provider. You may need to reschedule your appointment if you arrive late (15 or more minutes).  Arriving late affects you and other patients whose appointments are after yours.  Also, if you miss three or more appointments without notifying the office, you may be dismissed from the clinic at the provider's discretion.      For prescription refill requests, have your pharmacy contact our office and allow 72 hours for refills to be completed.    Today you received the following chemotherapy and/or immunotherapy agents 5-fu Leucovorin, oxaliplatin    To help prevent nausea and vomiting after your treatment, we encourage you to take your nausea medication as directed.  BELOW ARE SYMPTOMS THAT SHOULD BE REPORTED IMMEDIATELY: . *FEVER GREATER THAN 100.4 F (38 C) OR HIGHER . *CHILLS OR SWEATING . *NAUSEA AND VOMITING THAT IS NOT CONTROLLED WITH YOUR NAUSEA MEDICATION . *UNUSUAL SHORTNESS OF BREATH . *UNUSUAL BRUISING OR BLEEDING . *URINARY PROBLEMS (pain or burning when urinating, or frequent urination) . *BOWEL PROBLEMS (unusual diarrhea, constipation, pain near the anus) . TENDERNESS IN MOUTH AND THROAT WITH OR WITHOUT PRESENCE OF ULCERS (sore throat, sores in mouth, or a toothache) . UNUSUAL RASH, SWELLING OR PAIN  . UNUSUAL VAGINAL DISCHARGE OR ITCHING   Items with * indicate a potential emergency and should be followed up as soon as possible or go to the Emergency Department if any problems should occur.  Please show the CHEMOTHERAPY ALERT CARD or  IMMUNOTHERAPY ALERT CARD at check-in to the Emergency Department and triage nurse. Should you have questions after your visit or need to cancel or reschedule your appointment, please contact [facility]  [phone] and follow the prompts.  Office hours are 8:00 a.m. to 4:30 p.m. Monday - Friday. Please note that voicemails left after 4:00 p.m. may not be returned until the following business day.  We are closed weekends and major holidays. You have access to a nurse at all times for urgent questions. Please call the main number to the clinic [phone] and follow the prompts.  For any non-urgent questions, you may also contact your provider using MyChart. We now offer e-Visits for anyone 36 and older to request care online for non-urgent symptoms. For details visit [email or web address].   Also download the MyChart app! Go to the app store, search "MyChart", open the app, select [facility], and log in with your MyChart username and password.  Due to Covid, a mask is required upon entering the hospital/clinic. If you do not have a mask, one will be given to you upon arrival. For doctor visits, patients may have 1 support person aged 11 or older with them. For treatment visits, patients cannot have anyone with them due to current Covid guidelines and our immunocompromised population.

## 2020-06-23 NOTE — Progress Notes (Signed)
OK to treat today with ANC-1.5 per order of Dr. Marin Olp.

## 2020-06-23 NOTE — Progress Notes (Signed)
Hematology and Oncology Follow Up Visit  John [name] [id number] 09/11/1955 65 y.o. 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  This will be his last cycle of treatment.  He is done very nicely.  He really has not had much in the way of toxicity.  There is been no problems with diarrhea.  He has had no issues with rashes.  He has had no mouth sores.  He has had no cough or shortness of breath.  Apparently he still has some issues with his knees.  They do not seem to be as bad right now.  He has had no problems with headache.  Currently, his performance status is ECOG 0.   Medications:  Current Outpatient Medications:  .  cetirizine (ZYRTEC) 10 MG chewable tablet, Chew 10 mg by mouth daily., Disp: , Rfl:  .  dexamethasone (DECADRON) 4 MG tablet, Take 2 tablets (8 mg total) by mouth daily. Start the day after chemotherapy for 2 days. Take with food., Disp: 30 tablet, Rfl: 1 .  pantoprazole (PROTONIX) 40 MG tablet, TAKE 1 TABLET BY MOUTH TWICE DAILY, Disp: 90 tablet, Rfl: 3 .  telmisartan (MICARDIS) 40 MG tablet, Take 40 mg by mouth daily., Disp: , Rfl:  .  lidocaine-prilocaine (EMLA) cream, Apply to affected area once (Patient not taking: No sig reported), Disp: 30 g, Rfl: 3 .  ondansetron (ZOFRAN) 8 MG tablet, Take 1 tablet (8 mg total) by mouth 2 (two) times daily as needed for refractory nausea / vomiting. Start on day 3 after chemotherapy. (Patient not taking: No sig reported), Disp: 30 tablet, Rfl: 1 .  prochlorperazine (COMPAZINE) 10 MG tablet, TAKE 1 TABLET(10 MG) BY MOUTH EVERY 6 HOURS AS NEEDED FOR NAUSEA OR VOMITING (Patient not taking: No sig reported), Disp: 30 tablet, Rfl: 1  Allergies:  Allergies  Allergen Reactions   . Erythromycin     Upset stomach   . Lisinopril Cough  . Other Swelling    Bee Sting     Past Medical History, Surgical history, Social history, and Family History were reviewed and updated.  Review of Systems: Review of Systems  Constitutional: Negative.   HENT:  Negative.  Negative for voice change.   Eyes: Negative.   Respiratory: Negative.   Cardiovascular: Negative.   Gastrointestinal: Negative.   Endocrine: Negative.   Genitourinary: Negative.    Musculoskeletal: Negative.   Skin: Negative.   Neurological: Negative.   Hematological: Negative.   Psychiatric/Behavioral: Negative.     Physical Exam:  height is 6\' 5"  (1.956 m) and weight is 260 lb (117.9 kg). His oral temperature is 98.1 F (36.7 C). His blood pressure is 137/70 and his pulse is 80. His respiration is 17 and oxygen saturation is 96%.   Wt Readings from Last 3 Encounters:  06/23/20 260 lb (117.9 kg)  06/10/20 260 lb (117.9 kg)  05/26/20 262 lb 6.4 oz (119 kg)    Physical Exam Vitals reviewed.  HENT:     Head: Normocephalic and atraumatic.  Eyes:     Pupils: Pupils are equal, round, and reactive to light.  Cardiovascular:     Rate and Rhythm: Normal rate and regular rhythm.     Heart sounds: Normal heart sounds.  Pulmonary:  Effort: Pulmonary effort is normal.     Breath sounds: Normal breath sounds.  Abdominal:     General: Bowel sounds are normal.     Palpations: Abdomen is soft.     Comments: Abdominal exam shows well-healed laparoscopy scars.  He has no abdominal distention.  He has good bowel sounds.  There is no fluid wave.  There is no palpable liver or spleen tip.  Musculoskeletal:        General: No tenderness or deformity. Normal range of motion.     Cervical back: Normal range of motion.  Lymphadenopathy:     Cervical: No cervical adenopathy.  Skin:    General: Skin is warm and dry.     Findings: No erythema or rash.  Neurological:     Mental Status: He is alert and  oriented to person, place, and time.  Psychiatric:        Behavior: Behavior normal.        Thought Content: Thought content normal.        Judgment: Judgment normal.      Lab Results  Component Value Date   WBC 3.7 (L) 06/23/2020   HGB 11.5 (L) 06/23/2020   HCT 34.7 (L) 06/23/2020   MCV 91.6 06/23/2020   PLT 115 (L) 06/23/2020     Chemistry      Component Value Date/Time   NA 138 06/23/2020 0840   K 3.7 06/23/2020 0840   CL 104 06/23/2020 0840   CO2 24 06/23/2020 0840   BUN 9 06/23/2020 0840   CREATININE 0.98 06/23/2020 0840   CREATININE 1.14 12/04/2019 1310      Component Value Date/Time   CALCIUM 9.3 06/23/2020 0840   ALKPHOS 78 06/23/2020 0840   AST 26 06/23/2020 0840   ALT 21 06/23/2020 0840   BILITOT 0.6 06/23/2020 0840      Impression and Plan: John [name] is a a very nice 65 year old white male.  He has stage IIIb sigmoid colon cancer.  This is his eighth and final cycle of chemotherapy.  After this cycle, we will set him up with a CT scan in about 6 weeks or so.  He does have the Port-A-Cath in.  I think that we can probably get the Port-A-Cath taken out after his CT scan is done.  I just feel confident that he is going to be cured from his colon cancer because of the chemotherapy.  He really did not have high risk disease.    Volanda Napoleon, MD 5/10/20229:07 AM

## 2020-06-23 NOTE — Progress Notes (Signed)
Patient receiving last treatment today. Patient needs a CT prior to his next MD appointment. Scheduled. Patient given contrast while in the office and CT prep instructions including NPO and times to drink contrast.   Oncology Nurse Navigator Documentation  Oncology Nurse Navigator Flowsheets 06/23/2020  Abnormal Finding Date -  Diagnosis Status -  Phase of Treatment Chemo  Chemotherapy Actual Start Date: -  Chemotherapy Expected End Date: -  Chemotherapy Actual End Date: 06/23/2020  Expected Surgery Date -  Surgery Actual Start Date: -  Navigator Follow Up Date: 08/10/2020  Navigator Follow Up Reason: Follow-up Appointment;Scan Review  Navigator Location [facility]  Referral Date to RadOnc/MedOnc -  Navigator Encounter Type Appt/Treatment Plan Review  Telephone -  Treatment Initiated Date -  Patient Visit Type MedOnc  Treatment Phase Final Chemo TX  Barriers/Navigation Needs Coordination of Care;Education  Education Other  Interventions Coordination of Care;Education  Acuity Level 2-Minimal Needs (1-2 Barriers Identified)  Referrals -  Coordination of Care Radiology  Education Method Verbal  Support Groups/Services Friends and Family  Time Spent with Patient 30

## 2020-06-25 ENCOUNTER — Inpatient Hospital Stay: Payer: Medicare Other

## 2020-06-25 ENCOUNTER — Other Ambulatory Visit: Payer: Self-pay

## 2020-06-25 ENCOUNTER — Encounter: Payer: Self-pay | Admitting: *Deleted

## 2020-06-25 VITALS — BP 140/66 | HR 73 | Temp 97.5°F | Resp 17

## 2020-06-25 DIAGNOSIS — Z5111 Encounter for antineoplastic chemotherapy: Secondary | ICD-10-CM | POA: Diagnosis not present

## 2020-06-25 DIAGNOSIS — C189 Malignant neoplasm of colon, unspecified: Secondary | ICD-10-CM

## 2020-06-25 MED ORDER — HEPARIN SOD (PORK) LOCK FLUSH 100 UNIT/ML IV SOLN
500.0000 [IU] | Freq: Once | INTRAVENOUS | Status: AC | PRN
  Administered 2020-06-25: 500 [IU]
  Filled 2020-06-25: qty 5

## 2020-06-25 MED ORDER — SODIUM CHLORIDE 0.9% FLUSH
10.0000 mL | INTRAVENOUS | Status: DC | PRN
  Administered 2020-06-25: 10 mL
  Filled 2020-06-25: qty 10

## 2020-06-25 NOTE — Progress Notes (Signed)
Visited with patient at his pump dc appointment. This is his last treatment. He states he will have a rough ten days, but looks forward to feeling better and returning to baseline. He knows about his scans and follow up appointments at the end of next month.  Survivorship book and magazine given to patient.   Oncology Nurse Navigator Documentation  Oncology Nurse Navigator Flowsheets 06/25/2020  Abnormal Finding Date -  Diagnosis Status -  Phase of Treatment -  Chemotherapy Actual Start Date: -  Chemotherapy Expected End Date: -  Chemotherapy Actual End Date: -  Expected Surgery Date -  Surgery Actual Start Date: -  Navigator Follow Up Date: 08/10/2020  Navigator Follow Up Reason: Follow-up Appointment;Chemotherapy  Navigator Location [facility]  Referral Date to RadOnc/MedOnc -  Navigator Encounter Type Treatment  Telephone -  Treatment Initiated Date -  Patient Visit Type MedOnc  Treatment Phase Post-Tx Follow-up  Barriers/Navigation Needs Coordination of Care;Education  Education Other  Interventions Education;Psycho-Social Support  Acuity Level 2-Minimal Needs (1-2 Barriers Identified)  Referrals -  Coordination of Care -  Education Method Verbal;Written  Support Groups/Services Friends and Family  Time Spent with Patient 30

## 2020-07-24 ENCOUNTER — Other Ambulatory Visit: Payer: Self-pay | Admitting: *Deleted

## 2020-07-24 ENCOUNTER — Encounter: Payer: Self-pay | Admitting: *Deleted

## 2020-07-24 DIAGNOSIS — G609 Hereditary and idiopathic neuropathy, unspecified: Secondary | ICD-10-CM

## 2020-07-24 MED ORDER — GABAPENTIN 300 MG PO CAPS: 300.0000 mg | ORAL_CAPSULE | Freq: Three times a day (TID) | ORAL | 3 refills | Status: DC

## 2020-07-24 NOTE — Progress Notes (Signed)
Oncology Nurse Navigator Documentation  Oncology Nurse Navigator Flowsheets 07/24/2020  Abnormal Finding Date -  Diagnosis Status -  Phase of Treatment -  Chemotherapy Actual Start Date: -  Chemotherapy Expected End Date: -  Chemotherapy Actual End Date: -  Expected Surgery Date -  Surgery Actual Start Date: -  Navigator Follow Up Date: 08/10/2020  Navigator Follow Up Reason: Follow-up Appointment  Navigator Location [facility]  Referral Date to RadOnc/MedOnc -  Navigator Encounter Type MyChart  Telephone -  Treatment Initiated Date -  Patient Visit Type MedOnc  Treatment Phase Post-Tx Follow-up  Barriers/Navigation Needs Coordination of Care;Education  Education Other  Interventions Education;Medication Assistance;Psycho-Social Support  Acuity Level 2-Minimal Needs (1-2 Barriers Identified)  Referrals -  Coordination of Care -  Education Method Written  Support Groups/Services Friends and Family  Time Spent with Patient 30

## 2020-08-10 ENCOUNTER — Telehealth: Payer: Self-pay

## 2020-08-10 ENCOUNTER — Other Ambulatory Visit: Payer: Self-pay

## 2020-08-10 ENCOUNTER — Inpatient Hospital Stay: Payer: Medicare Other

## 2020-08-10 ENCOUNTER — Encounter: Payer: Self-pay | Admitting: Hematology & Oncology

## 2020-08-10 ENCOUNTER — Inpatient Hospital Stay (HOSPITAL_BASED_OUTPATIENT_CLINIC_OR_DEPARTMENT_OTHER): Payer: Medicare Other | Admitting: Hematology & Oncology

## 2020-08-10 ENCOUNTER — Inpatient Hospital Stay: Payer: Medicare Other | Attending: Hematology & Oncology

## 2020-08-10 ENCOUNTER — Ambulatory Visit (HOSPITAL_BASED_OUTPATIENT_CLINIC_OR_DEPARTMENT_OTHER)
Admission: RE | Admit: 2020-08-10 | Discharge: 2020-08-10 | Disposition: A | Discharge status: Home / Self Care | Payer: Medicare Other | Source: Ambulatory Visit | Attending: Hematology & Oncology | Admitting: Hematology & Oncology

## 2020-08-10 VITALS — BP 128/77 | HR 66 | Temp 98.0°F | Resp 18 | Wt 271.0 lb

## 2020-08-10 DIAGNOSIS — C187 Malignant neoplasm of sigmoid colon: Secondary | ICD-10-CM | POA: Insufficient documentation

## 2020-08-10 DIAGNOSIS — Z79899 Other long term (current) drug therapy: Secondary | ICD-10-CM | POA: Insufficient documentation

## 2020-08-10 DIAGNOSIS — C185 Malignant neoplasm of splenic flexure: Secondary | ICD-10-CM

## 2020-08-10 DIAGNOSIS — Z91030 Bee allergy status: Secondary | ICD-10-CM | POA: Diagnosis not present

## 2020-08-10 DIAGNOSIS — Z888 Allergy status to other drugs, medicaments and biological substances status: Secondary | ICD-10-CM | POA: Insufficient documentation

## 2020-08-10 DIAGNOSIS — Z7952 Long term (current) use of systemic steroids: Secondary | ICD-10-CM | POA: Insufficient documentation

## 2020-08-10 DIAGNOSIS — G629 Polyneuropathy, unspecified: Secondary | ICD-10-CM | POA: Insufficient documentation

## 2020-08-10 DIAGNOSIS — C184 Malignant neoplasm of transverse colon: Secondary | ICD-10-CM

## 2020-08-10 DIAGNOSIS — Z881 Allergy status to other antibiotic agents status: Secondary | ICD-10-CM | POA: Diagnosis not present

## 2020-08-10 DIAGNOSIS — Z95828 Presence of other vascular implants and grafts: Secondary | ICD-10-CM

## 2020-08-10 LAB — CMP (CANCER CENTER ONLY)
ALT: 14 U/L (ref 0–44)
AST: 21 U/L (ref 15–41)
Albumin: 3.9 g/dL (ref 3.5–5.0)
Alkaline Phosphatase: 61 U/L (ref 38–126)
Anion gap: 8 (ref 5–15)
BUN: 13 mg/dL (ref 8–23)
CO2: 25 mmol/L (ref 22–32)
Calcium: 9.4 mg/dL (ref 8.9–10.3)
Chloride: 106 mmol/L (ref 98–111)
Creatinine: 0.98 mg/dL (ref 0.61–1.24)
GFR, Estimated: >60 mL/min (ref >60)
Glucose, Bld: 92 mg/dL (ref 70–99)
Potassium: 4.2 mmol/L (ref 3.5–5.1)
Sodium: 139 mmol/L (ref 135–145)
Total Bilirubin: 0.9 mg/dL (ref 0.3–1.2)
Total Protein: 6.2 g/dL — ABNORMAL LOW (ref 6.5–8.1)

## 2020-08-10 LAB — CBC WITH DIFFERENTIAL (CANCER CENTER ONLY)
Abs Immature Granulocytes: 0.01 10*3/uL (ref 0.00–0.07)
Basophils Absolute: 0 10*3/uL (ref 0.0–0.1)
Basophils Relative: 1 %
Eosinophils Absolute: 0.1 10*3/uL (ref 0.0–0.5)
Eosinophils Relative: 3 %
HCT: 36.3 % — ABNORMAL LOW (ref 39.0–52.0)
Hemoglobin: 12 g/dL — ABNORMAL LOW (ref 13.0–17.0)
Immature Granulocytes: 0 %
Lymphocytes Relative: 31 %
Lymphs Abs: 1.3 10*3/uL (ref 0.7–4.0)
MCH: 32.3 pg (ref 26.0–34.0)
MCHC: 33.1 g/dL (ref 30.0–36.0)
MCV: 97.6 fL (ref 80.0–100.0)
Monocytes Absolute: 0.4 10*3/uL (ref 0.1–1.0)
Monocytes Relative: 9 %
Neutro Abs: 2.4 10*3/uL (ref 1.7–7.7)
Neutrophils Relative %: 56 %
Platelet Count: 145 10*3/uL — ABNORMAL LOW (ref 150–400)
RBC: 3.72 MIL/uL — ABNORMAL LOW (ref 4.22–5.81)
RDW: 14.1 % (ref 11.5–15.5)
WBC Count: 4.3 10*3/uL (ref 4.0–10.5)
nRBC: 0 % (ref 0.0–0.2)

## 2020-08-10 LAB — CEA (IN HOUSE-CHCC): CEA (CHCC-In House): <1 ng/mL (ref 0.00–5.00)

## 2020-08-10 MED ORDER — SODIUM CHLORIDE 0.9% FLUSH
10.0000 mL | Freq: Once | INTRAVENOUS | Status: AC
  Administered 2020-08-10: 10 mL via INTRAVENOUS
  Filled 2020-08-10: qty 10

## 2020-08-10 MED ORDER — HEPARIN SOD (PORK) LOCK FLUSH 100 UNIT/ML IV SOLN
500.0000 [IU] | Freq: Once | INTRAVENOUS | Status: AC
Start: 2020-08-10 — End: 2020-08-10
  Administered 2020-08-10: 500 [IU] via INTRAVENOUS
  Filled 2020-08-10: qty 5

## 2020-08-10 NOTE — Telephone Encounter (Signed)
Appts made and printed for pt  John [name]

## 2020-08-10 NOTE — Addendum Note (Signed)
Addended by: Tyler Aas A on: 08/10/2020 10:52 AM   Modules accepted: Orders

## 2020-08-10 NOTE — Progress Notes (Signed)
Hematology and Oncology Follow Up Visit  John [name] [id number] December 26, 1955 65 y.o. 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  He is doing quite well.  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  This may be from the chemotherapy.  However, there is a family history of neuropathy with his father.  We went ahead and did a CT scan on him today.  Thankfully, the CT scan did not show any evidence of recurrent disease.  He has had no abdominal pain.  He has had no change in bowel or bladder habits.  He has had no fever.  He has had no rashes.    His CEA was less than 1.  He has had no cough.  Has had no headache.  Overall, his performance status is ECOG 1.    Medications:  Current Outpatient Medications:    cetirizine (ZYRTEC) 10 MG chewable tablet, Chew 10 mg by mouth daily., Disp: , Rfl:    gabapentin (NEURONTIN) 300 MG capsule, Take 1 capsule (300 mg total) by mouth 3 (three) times daily., Disp: 90 capsule, Rfl: 3   pantoprazole (PROTONIX) 40 MG tablet, TAKE 1 TABLET BY MOUTH TWICE DAILY, Disp: 90 tablet, Rfl: 3   telmisartan (MICARDIS) 40 MG tablet, Take 40 mg by mouth daily., Disp: , Rfl:    dexamethasone (DECADRON) 4 MG tablet, Take 2 tablets (8 mg total) by mouth daily. Start the day after chemotherapy for 2 days. Take with food. (Patient not taking: Reported on 08/10/2020), Disp: 30 tablet, Rfl: 1   lidocaine-prilocaine (EMLA) cream, Apply to affected area once (Patient not taking: No sig reported), Disp: 30 g, Rfl: 3   ondansetron (ZOFRAN) 8 MG tablet, Take 1 tablet (8 mg total) by mouth 2 (two) times daily as needed for refractory nausea / vomiting. Start on day 3 after chemotherapy. (Patient not taking: No sig  reported), Disp: 30 tablet, Rfl: 1   prochlorperazine (COMPAZINE) 10 MG tablet, TAKE 1 TABLET(10 MG) BY MOUTH EVERY 6 HOURS AS NEEDED FOR NAUSEA OR VOMITING (Patient not taking: No sig reported), Disp: 30 tablet, Rfl: 1  Allergies:  Allergies  Allergen Reactions   Other Swelling    Bee Sting  Bee Sting    Erythromycin     Upset stomach    Lisinopril Cough    Past Medical History, Surgical history, Social history, and Family History were reviewed and updated.  Review of Systems: Review of Systems  Constitutional: Negative.   HENT:  Negative.  Negative for voice change.   Eyes: Negative.   Respiratory: Negative.    Cardiovascular: Negative.   Gastrointestinal: Negative.   Endocrine: Negative.   Genitourinary: Negative.    Musculoskeletal: Negative.   Skin: Negative.   Neurological: Negative.   Hematological: Negative.   Psychiatric/Behavioral: Negative.     Physical Exam:  weight is 271 lb (122.9 kg). His oral temperature is 98 F (36.7 C). His blood pressure is 128/77 and his pulse is 66. His respiration is 18.   Wt Readings from Last 3 Encounters:  08/10/20 271 lb (122.9 kg)  06/23/20 260 lb (117.9 kg)  06/10/20 260 lb (117.9 kg)    Physical Exam Vitals reviewed.  HENT:     Head: Normocephalic and atraumatic.  Eyes:     Pupils: Pupils are equal, round, and reactive to light.  Cardiovascular:     Rate and Rhythm: Normal rate and regular rhythm.     Heart sounds: Normal heart sounds.  Pulmonary:     Effort: Pulmonary effort is normal.     Breath sounds: Normal breath sounds.  Abdominal:     General: Bowel sounds are normal.     Palpations: Abdomen is soft.     Comments: Abdominal exam shows well-healed laparoscopy scars.  He has no abdominal distention.  He has good bowel sounds.  There is no fluid wave.  There is no palpable liver or spleen tip.  Musculoskeletal:        General: No tenderness or deformity. Normal range of motion.     Cervical back: Normal  range of motion.  Lymphadenopathy:     Cervical: No cervical adenopathy.  Skin:    General: Skin is warm and dry.     Findings: No erythema or rash.  Neurological:     Mental Status: He is alert and oriented to person, place, and time.  Psychiatric:        Behavior: Behavior normal.        Thought Content: Thought content normal.        Judgment: Judgment normal.     Lab Results  Component Value Date   WBC 4.3 08/10/2020   HGB 12.0 (L) 08/10/2020   HCT 36.3 (L) 08/10/2020   MCV 97.6 08/10/2020   PLT 145 (L) 08/10/2020     Chemistry      Component Value Date/Time   NA 139 08/10/2020 0847   K 4.2 08/10/2020 0847   CL 106 08/10/2020 0847   CO2 25 08/10/2020 0847   BUN 13 08/10/2020 0847   CREATININE 0.98 08/10/2020 0847   CREATININE 1.14 12/04/2019 1310      Component Value Date/Time   CALCIUM 9.4 08/10/2020 0847   ALKPHOS 61 08/10/2020 0847   AST 21 08/10/2020 0847   ALT 14 08/10/2020 0847   BILITOT 0.9 08/10/2020 0847      Impression and Plan: John [name] is a a very nice 65 year old white male.  He has stage IIIb sigmoid colon cancer.  He completed all this adjuvant chemotherapy.  This was back in May.  His CT scan was fantastic.  We will plan to get him back in another 4 months now.  We will do a CT scan when we see him back.  He still has a Port-A-Cath in.  We will keep this in for right now.  He will get this flushed in 2 months.  Follow-up   Volanda Napoleon, MD 6/27/202210:20 AM

## 2020-08-10 NOTE — Patient Instructions (Signed)
Implanted Port Home Guide An implanted port is a device that is placed under the skin. It is usually placed in the chest. The device can be used to give IV medicine, to take blood, or for dialysis. You may have an implanted port if: You need IV medicine that would be irritating to the small veins in your hands or arms. You need IV medicines, such as antibiotics, for a long period of time. You need IV nutrition for a long period of time. You need dialysis. When you have a port, your health care provider can choose to use the port instead of veins in your arms for these procedures. You may have fewer limitations when using a port than you would if you used other types of long-term IVs, and you will likely be able to return to normal activities afteryour incision heals. An implanted port has two main parts: Reservoir. The reservoir is the part where a needle is inserted to give medicines or draw blood. The reservoir is round. After it is placed, it appears as a small, raised area under your skin. Catheter. The catheter is a thin, flexible tube that connects the reservoir to a vein. Medicine that is inserted into the reservoir goes into the catheter and then into the vein. How is my port accessed? To access your port: A numbing cream may be placed on the skin over the port site. Your health care provider will put on a mask and sterile gloves. The skin over your port will be cleaned carefully with a germ-killing soap and allowed to dry. Your health care provider will gently pinch the port and insert a needle into it. Your health care provider will check for a blood return to make sure the port is in the vein and is not clogged. If your port needs to remain accessed to get medicine continuously (constant infusion), your health care provider will place a clear bandage (dressing) over the needle site. The dressing and needle will need to be changed every week, or as told by your health care provider. What  is flushing? Flushing helps keep the port from getting clogged. Follow instructions from your health care provider about how and when to flush the port. Ports are usually flushed with saline solution or a medicine called heparin. The need for flushing will depend on how the port is used: If the port is only used from time to time to give medicines or draw blood, the port may need to be flushed: Before and after medicines have been given. Before and after blood has been drawn. As part of routine maintenance. Flushing may be recommended every 4-6 weeks. If a constant infusion is running, the port may not need to be flushed. Throw away any syringes in a disposal container that is meant for sharp items (sharps container). You can buy a sharps container from a pharmacy, or you can make one by using an empty hard plastic bottle with a cover. How long will my port stay implanted? The port can stay in for as long as your health care provider thinks it is needed. When it is time for the port to come out, a surgery will be done to remove it. The surgery will be similar to the procedure that was done to putthe port in. Follow these instructions at home:  Flush your port as told by your health care provider. If you need an infusion over several days, follow instructions from your health care provider about how to take   care of your port site. Make sure you: Wash your hands with soap and water before you change your dressing. If soap and water are not available, use alcohol-based hand sanitizer. Change your dressing as told by your health care provider. Place any used dressings or infusion bags into a plastic bag. Throw that bag in the trash. Keep the dressing that covers the needle clean and dry. Do not get it wet. Do not use scissors or sharp objects near the tube. Keep the tube clamped, unless it is being used. Check your port site every day for signs of infection. Check for: Redness, swelling, or  pain. Fluid or blood. Pus or a bad smell. Protect the skin around the port site. Avoid wearing bra straps that rub or irritate the site. Protect the skin around your port from seat belts. Place a soft pad over your chest if needed. Bathe or shower as told by your health care provider. The site may get wet as long as you are not actively receiving an infusion. Return to your normal activities as told by your health care provider. Ask your health care provider what activities are safe for you. Carry a medical alert card or wear a medical alert bracelet at all times. This will let health care providers know that you have an implanted port in case of an emergency. Get help right away if: You have redness, swelling, or pain at the port site. You have fluid or blood coming from your port site. You have pus or a bad smell coming from the port site. You have a fever. Summary Implanted ports are usually placed in the chest for long-term IV access. Follow instructions from your health care provider about flushing the port and changing bandages (dressings). Take care of the area around your port by avoiding clothing that puts pressure on the area, and by watching for signs of infection. Protect the skin around your port from seat belts. Place a soft pad over your chest if needed. Get help right away if you have a fever or you have redness, swelling, pain, drainage, or a bad smell at the port site. This information is not intended to replace advice given to you by your health care provider. Make sure you discuss any questions you have with your healthcare provider. Document Revised: 06/17/2019 Document Reviewed: 06/17/2019 Elsevier Patient Education  2022 Elsevier Inc.  

## 2020-08-11 ENCOUNTER — Encounter: Payer: Self-pay | Admitting: *Deleted

## 2020-08-11 NOTE — Progress Notes (Signed)
Oncology Nurse Navigator Documentation  Oncology Nurse Navigator Flowsheets 08/11/2020  Abnormal Finding Date -  Diagnosis Status -  Phase of Treatment -  Chemotherapy Actual Start Date: -  Chemotherapy Expected End Date: -  Chemotherapy Actual End Date: -  Expected Surgery Date -  Surgery Actual Start Date: -  Navigator Follow Up Date: 12/15/2020  Navigator Follow Up Reason: Follow-up Appointment;Scan Review  Navigator Location [facility]  Referral Date to RadOnc/MedOnc -  Navigator Encounter Type Scan Review;Follow-up Appt  Telephone -  Treatment Initiated Date -  Patient Visit Type MedOnc  Treatment Phase Post-Tx Follow-up  Barriers/Navigation Needs Coordination of Care;Education  Education -  Interventions None Required  Acuity Level 2-Minimal Needs (1-2 Barriers Identified)  Referrals -  Coordination of Care -  Education Method -  Support Groups/Services Friends and Family  Time Spent with Patient 15

## 2020-08-20 ENCOUNTER — Other Ambulatory Visit: Payer: Self-pay | Admitting: *Deleted

## 2020-08-20 ENCOUNTER — Encounter: Payer: Self-pay | Admitting: *Deleted

## 2020-08-20 DIAGNOSIS — G609 Hereditary and idiopathic neuropathy, unspecified: Secondary | ICD-10-CM

## 2020-08-20 MED ORDER — GABAPENTIN 400 MG PO CAPS: 400.0000 mg | ORAL_CAPSULE | Freq: Three times a day (TID) | ORAL | 2 refills | Status: DC

## 2020-08-20 NOTE — Progress Notes (Signed)
Patient calling with c/o increased neuropathy, now in his hands as well as his feet which had been previously reported. He is taking Neurontin 300mg  TID as well as vitamin B Complex. He also feels like the Neurontin had led to increased swelling to his lower extremities and feet/ankles.   Spoke with Dr Marin Olp. He would like patient to increase Neurontin to 400mg  TID. As far as the lower extremity edema, Dr Marin Olp would like patient to continue to monitor, and notify the office if it become worse with dosage increase.   Patient notified of Dr Antonieta Pert recommendations. He is aware of new prescription and pharmacy confirmed.   Oncology Nurse Navigator Documentation  Oncology Nurse Navigator Flowsheets 08/20/2020  Abnormal Finding Date -  Diagnosis Status -  Phase of Treatment -  Chemotherapy Actual Start Date: -  Chemotherapy Expected End Date: -  Chemotherapy Actual End Date: -  Expected Surgery Date -  Surgery Actual Start Date: -  Navigator Follow Up Date: 12/15/2020  Navigator Follow Up Reason: Follow-up Appointment;Scan Review  Navigator Location [facility]  Referral Date to RadOnc/MedOnc -  Navigator Encounter Type Telephone  Telephone Symptom Mgt  Treatment Initiated Date -  Patient Visit Type MedOnc  Treatment Phase Post-Tx Follow-up  Barriers/Navigation Needs Coordination of Care;Education  Education Pain/ Symptom Management  Interventions Coordination of Care;Education;Medication Assistance;Psycho-Social Support  Acuity Level 2-Minimal Needs (1-2 Barriers Identified)  Referrals -  Coordination of Care Other  Education Method Verbal;Teach-back  Support Groups/Services Friends and Family  Time Spent with Patient 30

## 2020-10-06 ENCOUNTER — Telehealth: Payer: Self-pay

## 2020-10-06 ENCOUNTER — Other Ambulatory Visit: Payer: Self-pay

## 2020-10-06 ENCOUNTER — Inpatient Hospital Stay: Payer: Medicare Other | Attending: Hematology & Oncology

## 2020-10-06 VITALS — BP 133/74 | HR 69 | Temp 98.1°F | Resp 19

## 2020-10-06 DIAGNOSIS — C187 Malignant neoplasm of sigmoid colon: Secondary | ICD-10-CM | POA: Insufficient documentation

## 2020-10-06 DIAGNOSIS — Z452 Encounter for adjustment and management of vascular access device: Secondary | ICD-10-CM | POA: Insufficient documentation

## 2020-10-06 DIAGNOSIS — Z95828 Presence of other vascular implants and grafts: Secondary | ICD-10-CM

## 2020-10-06 MED ORDER — SODIUM CHLORIDE 0.9% FLUSH
10.0000 mL | Freq: Once | INTRAVENOUS | Status: AC
Start: 2020-10-06 — End: 2020-10-06
  Administered 2020-10-06: 10 mL via INTRAVENOUS

## 2020-10-06 MED ORDER — HEPARIN SOD (PORK) LOCK FLUSH 100 UNIT/ML IV SOLN
500.0000 [IU] | Freq: Once | INTRAVENOUS | Status: AC
  Administered 2020-10-06: 500 [IU] via INTRAVENOUS

## 2020-10-06 NOTE — Telephone Encounter (Signed)
Pt  in office, req to move appts to 10/17 due to upcoming knee surg.moved and new calendar printed for pts wife  Sholanda Croson

## 2020-11-12 ENCOUNTER — Encounter: Payer: Self-pay | Admitting: *Deleted

## 2020-11-12 NOTE — Progress Notes (Signed)
Oncology Nurse Navigator Documentation  Oncology Nurse Navigator Flowsheets 11/12/2020  Abnormal Finding Date -  Diagnosis Status -  Phase of Treatment -  Chemotherapy Actual Start Date: -  Chemotherapy Expected End Date: -  Chemotherapy Actual End Date: -  Expected Surgery Date -  Surgery Actual Start Date: -  Navigator Follow Up Date: 11/30/2020  Navigator Follow Up Reason: Follow-up Appointment;Scan Review  Navigator Location [facility]  Referral Date to RadOnc/MedOnc -  Navigator Encounter Type Appt/Treatment Plan Review  Telephone -  Treatment Initiated Date -  Patient Visit Type MedOnc  Treatment Phase Post-Tx Follow-up  Barriers/Navigation Needs Coordination of Care;Education  Education -  Interventions None Required  Acuity Level 2-Minimal Needs (1-2 Barriers Identified)  Referrals -  Coordination of Care -  Education Method -  Support Groups/Services Friends and Family  Time Spent with Patient 15

## 2020-11-20 ENCOUNTER — Other Ambulatory Visit: Payer: Self-pay | Admitting: Gastroenterology

## 2020-11-26 ENCOUNTER — Other Ambulatory Visit (HOSPITAL_BASED_OUTPATIENT_CLINIC_OR_DEPARTMENT_OTHER): Payer: Medicare Other

## 2020-11-27 ENCOUNTER — Other Ambulatory Visit: Payer: Self-pay | Admitting: Hematology & Oncology

## 2020-11-27 ENCOUNTER — Other Ambulatory Visit: Payer: Self-pay | Admitting: *Deleted

## 2020-11-27 ENCOUNTER — Telehealth: Payer: Self-pay | Admitting: *Deleted

## 2020-11-27 DIAGNOSIS — G609 Hereditary and idiopathic neuropathy, unspecified: Secondary | ICD-10-CM

## 2020-11-27 MED ORDER — GABAPENTIN 400 MG PO CAPS
ORAL_CAPSULE | ORAL | 3 refills | Status: DC
Start: 2020-11-27 — End: 2021-03-01

## 2020-11-27 MED ORDER — GABAPENTIN 400 MG PO CAPS: 400.0000 mg | ORAL_CAPSULE | Freq: Three times a day (TID) | ORAL | 3 refills | Status: DC

## 2020-11-27 MED ORDER — GABAPENTIN 400 MG PO CAPS: ORAL_CAPSULE | ORAL | 3 refills | Status: DC

## 2020-11-27 NOTE — Telephone Encounter (Signed)
Rx for Gabapentin unable to be refilled electronically. Call to [facility], spoke with Pharmacist, refilled pt Gabapentin 400mg  q#90 R#3.  Called spoke with pt and notified rx has been called in. Pt verbalized understanding. No further concerns at this time.

## 2020-11-30 ENCOUNTER — Inpatient Hospital Stay (HOSPITAL_BASED_OUTPATIENT_CLINIC_OR_DEPARTMENT_OTHER): Payer: Medicare Other | Admitting: Hematology & Oncology

## 2020-11-30 ENCOUNTER — Inpatient Hospital Stay: Payer: Medicare Other | Attending: Hematology & Oncology

## 2020-11-30 ENCOUNTER — Encounter: Payer: Self-pay | Admitting: Hematology & Oncology

## 2020-11-30 ENCOUNTER — Ambulatory Visit (HOSPITAL_BASED_OUTPATIENT_CLINIC_OR_DEPARTMENT_OTHER)
Admission: RE | Admit: 2020-11-30 | Discharge: 2020-11-30 | Disposition: A | Discharge status: Home / Self Care | Payer: Medicare Other | Source: Ambulatory Visit | Attending: Hematology & Oncology | Admitting: Hematology & Oncology

## 2020-11-30 ENCOUNTER — Other Ambulatory Visit: Payer: Self-pay

## 2020-11-30 ENCOUNTER — Inpatient Hospital Stay: Payer: Medicare Other

## 2020-11-30 ENCOUNTER — Encounter (HOSPITAL_BASED_OUTPATIENT_CLINIC_OR_DEPARTMENT_OTHER): Payer: Self-pay

## 2020-11-30 VITALS — BP 172/82 | HR 63 | Temp 97.7°F | Resp 20

## 2020-11-30 VITALS — Ht 77.0 in | Wt 265.0 lb

## 2020-11-30 DIAGNOSIS — Z95828 Presence of other vascular implants and grafts: Secondary | ICD-10-CM

## 2020-11-30 DIAGNOSIS — C184 Malignant neoplasm of transverse colon: Secondary | ICD-10-CM

## 2020-11-30 DIAGNOSIS — C186 Malignant neoplasm of descending colon: Secondary | ICD-10-CM | POA: Diagnosis not present

## 2020-11-30 LAB — CBC WITH DIFFERENTIAL (CANCER CENTER ONLY)
Abs Immature Granulocytes: 0.06 10*3/uL (ref 0.00–0.07)
Basophils Absolute: 0 10*3/uL (ref 0.0–0.1)
Basophils Relative: 1 %
Eosinophils Absolute: 0.1 10*3/uL (ref 0.0–0.5)
Eosinophils Relative: 1 %
HCT: 40.8 % (ref 39.0–52.0)
Hemoglobin: 13.9 g/dL (ref 13.0–17.0)
Immature Granulocytes: 1 %
Lymphocytes Relative: 27 %
Lymphs Abs: 1.8 10*3/uL (ref 0.7–4.0)
MCH: 31.7 pg (ref 26.0–34.0)
MCHC: 34.1 g/dL (ref 30.0–36.0)
MCV: 93.2 fL (ref 80.0–100.0)
Monocytes Absolute: 0.5 10*3/uL (ref 0.1–1.0)
Monocytes Relative: 7 %
Neutro Abs: 4.4 10*3/uL (ref 1.7–7.7)
Neutrophils Relative %: 63 %
Platelet Count: 184 10*3/uL (ref 150–400)
RBC: 4.38 MIL/uL (ref 4.22–5.81)
RDW: 13.4 % (ref 11.5–15.5)
WBC Count: 6.8 10*3/uL (ref 4.0–10.5)
nRBC: 0 % (ref 0.0–0.2)

## 2020-11-30 LAB — CMP (CANCER CENTER ONLY)
ALT: 32 U/L (ref 0–44)
AST: 25 U/L (ref 15–41)
Albumin: 4.1 g/dL (ref 3.5–5.0)
Alkaline Phosphatase: 78 U/L (ref 38–126)
Anion gap: 9 (ref 5–15)
BUN: 16 mg/dL (ref 8–23)
CO2: 28 mmol/L (ref 22–32)
Calcium: 9.4 mg/dL (ref 8.9–10.3)
Chloride: 104 mmol/L (ref 98–111)
Creatinine: 1.03 mg/dL (ref 0.61–1.24)
GFR, Estimated: >60 mL/min (ref >60)
Glucose, Bld: 91 mg/dL (ref 70–99)
Potassium: 4 mmol/L (ref 3.5–5.1)
Sodium: 141 mmol/L (ref 135–145)
Total Bilirubin: 0.8 mg/dL (ref 0.3–1.2)
Total Protein: 7 g/dL (ref 6.5–8.1)

## 2020-11-30 LAB — LACTATE DEHYDROGENASE: LDH: 140 U/L (ref 98–192)

## 2020-11-30 LAB — CEA (IN HOUSE-CHCC): CEA (CHCC-In House): <1 ng/mL (ref 0.00–5.00)

## 2020-11-30 MED ORDER — SODIUM CHLORIDE 0.9% FLUSH
10.0000 mL | Freq: Once | INTRAVENOUS | Status: AC
  Administered 2020-11-30: 10 mL via INTRAVENOUS

## 2020-11-30 MED ORDER — HEPARIN SOD (PORK) LOCK FLUSH 100 UNIT/ML IV SOLN
500.0000 [IU] | Freq: Once | INTRAVENOUS | Status: AC
  Administered 2020-11-30: 500 [IU] via INTRAVENOUS

## 2020-11-30 MED ORDER — IOHEXOL 300 MG/ML  SOLN
100.0000 mL | Freq: Once | INTRAMUSCULAR | Status: AC | PRN
  Administered 2020-11-30: 100 mL via INTRAVENOUS

## 2020-11-30 NOTE — Patient Instructions (Signed)

## 2020-11-30 NOTE — Progress Notes (Signed)
Hematology and Oncology Follow Up Visit  John [name] [id number] [birth year] 65 y.o. 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  The big news is that he could have knee surgery for the right knee next week.  This will be on October 27.  I will be done up in [place].  He has CAT scan done today.  The CAT scan did not show any evidence of recurrent colon cancer.  His last CEA level back in June was less than 1.  He still has neuropathy.  It is getting a little bit better.  Hopefully, this will continue to improve over time.  He has had no problems with bowels or bladder.  He has had no problems with nausea or vomiting.  He has had no issues with fever.  He has had no bleeding.  He has had no rashes.  He has had no problems with COVID.  His appetite has been quite good.  Overall, I would say his performance status is probably ECOG 0.    Medications:  Current Outpatient Medications:    cetirizine (ZYRTEC) 10 MG chewable tablet, Chew 10 mg by mouth daily as needed., Disp: , Rfl:    gabapentin (NEURONTIN) 400 MG capsule, Take one tablet three times a day, Disp: 90 capsule, Rfl: 3   pantoprazole (PROTONIX) 40 MG tablet, Take 1 tablet (40 mg total) by mouth 2 (two) times daily. Please call [phone] to schedule an office visit for more refills (Patient taking differently: Take 40 mg by mouth daily. Please call [phone] to schedule an office visit for more refills), Disp: 180 tablet, Rfl: 1   telmisartan (MICARDIS) 40 MG tablet, Take 40 mg by mouth daily., Disp: , Rfl:    ondansetron (ZOFRAN) 8 MG tablet, Take 1 tablet (8 mg total) by mouth 2 (two) times daily as needed for refractory nausea / vomiting. Start on day 3 after  chemotherapy. (Patient not taking: No sig reported), Disp: 30 tablet, Rfl: 1   prochlorperazine (COMPAZINE) 10 MG tablet, TAKE 1 TABLET(10 MG) BY MOUTH EVERY 6 HOURS AS NEEDED FOR NAUSEA OR VOMITING (Patient not taking: No sig reported), Disp: 30 tablet, Rfl: 1  Allergies:  Allergies  Allergen Reactions   Other Swelling    Bee Sting  Bee Sting    Erythromycin     Upset stomach    Lisinopril Cough    Past Medical History, Surgical history, Social history, and Family History were reviewed and updated.  Review of Systems: Review of Systems  Constitutional: Negative.   HENT:  Negative.  Negative for voice change.   Eyes: Negative.   Respiratory: Negative.    Cardiovascular: Negative.   Gastrointestinal: Negative.   Endocrine: Negative.   Genitourinary: Negative.    Musculoskeletal: Negative.   Skin: Negative.   Neurological: Negative.   Hematological: Negative.   Psychiatric/Behavioral: Negative.     Physical Exam:  oral temperature is 97.7 F (36.5 C). His blood pressure is 172/82 (abnormal) and his pulse is 63. His respiration is 20 and oxygen saturation is 99%.   Wt Readings from Last 3 Encounters:  11/30/20 265 lb (120.2 kg)  08/10/20 271 lb (122.9 kg)  06/23/20 260 lb (117.9 kg)    Physical Exam Vitals reviewed.  HENT:  Head: Normocephalic and atraumatic.  Eyes:     Pupils: Pupils are equal, round, and reactive to light.  Cardiovascular:     Rate and Rhythm: Normal rate and regular rhythm.     Heart sounds: Normal heart sounds.  Pulmonary:     Effort: Pulmonary effort is normal.     Breath sounds: Normal breath sounds.  Abdominal:     General: Bowel sounds are normal.     Palpations: Abdomen is soft.     Comments: Abdominal exam shows well-healed laparoscopy scars.  He has no abdominal distention.  He has good bowel sounds.  There is no fluid wave.  There is no palpable liver or spleen tip.  Musculoskeletal:        General: No tenderness or deformity.  Normal range of motion.     Cervical back: Normal range of motion.  Lymphadenopathy:     Cervical: No cervical adenopathy.  Skin:    General: Skin is warm and dry.     Findings: No erythema or rash.  Neurological:     Mental Status: He is alert and oriented to person, place, and time.  Psychiatric:        Behavior: Behavior normal.        Thought Content: Thought content normal.        Judgment: Judgment normal.     Lab Results  Component Value Date   WBC 6.8 11/30/2020   HGB 13.9 11/30/2020   HCT 40.8 11/30/2020   MCV 93.2 11/30/2020   PLT 184 11/30/2020     Chemistry      Component Value Date/Time   NA 141 11/30/2020 0845   K 4.0 11/30/2020 0845   CL 104 11/30/2020 0845   CO2 28 11/30/2020 0845   BUN 16 11/30/2020 0845   CREATININE 1.03 11/30/2020 0845   CREATININE 1.14 12/04/2019 1310      Component Value Date/Time   CALCIUM 9.4 11/30/2020 0845   ALKPHOS 78 11/30/2020 0845   AST 25 11/30/2020 0845   ALT 32 11/30/2020 0845   BILITOT 0.8 11/30/2020 0845      Impression and Plan: John [name] is a a very nice 65 year old white male.  He has stage IIIb sigmoid colon cancer.  He completed all this adjuvant chemotherapy.  This was back in May.  Everything looks fantastic.  I will see a problem with him having his knee surgery.  This is all about quality of life.  He should be able to heal up well from this.  We will plan for another follow-up in 4 or 5 months.  We will plan to do another CT scan when we see him back.  I think that we can get her back maybe in early March.  I feel confident that we can get him through the Winter.  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  He will bring [place] in some of his newer flavors when he comes back.   Volanda Napoleon, MD 10/17/202210:57 AM

## 2020-12-01 ENCOUNTER — Encounter: Payer: Self-pay | Admitting: *Deleted

## 2020-12-01 NOTE — Progress Notes (Signed)
Oncology Nurse Navigator Documentation  Oncology Nurse Navigator Flowsheets 12/01/2020  Abnormal Finding Date -  Diagnosis Status -  Phase of Treatment -  Chemotherapy Actual Start Date: -  Chemotherapy Expected End Date: -  Chemotherapy Actual End Date: -  Expected Surgery Date -  Surgery Actual Start Date: -  Navigator Follow Up Date: -  Navigator Follow Up Reason: -  Navigation Complete Date: 12/01/2020  Post Navigation: Continue to Follow Patient? No  Reason Not Navigating Patient: No Treatment, Observation Only  Navigator Location [facility]  Referral Date to RadOnc/MedOnc -  Navigator Encounter Type Appt/Treatment Plan Review  Telephone -  Treatment Initiated Date -  Patient Visit Type MedOnc  Treatment Phase Post-Tx Follow-up  Barriers/Navigation Needs No Barriers At This Time  Education -  Interventions None Required  Acuity Level 1-No Barriers  Referrals -  Coordination of Care -  Education Method -  Support Groups/Services Friends and Family  Time Spent with Patient 15

## 2020-12-10 HISTORY — PX: REPLACEMENT TOTAL KNEE: SUR1224

## 2020-12-15 ENCOUNTER — Ambulatory Visit: Payer: Medicare Other | Admitting: Hematology & Oncology

## 2020-12-15 ENCOUNTER — Other Ambulatory Visit: Payer: Medicare Other

## 2021-01-29 ENCOUNTER — Other Ambulatory Visit: Payer: Self-pay

## 2021-01-29 ENCOUNTER — Inpatient Hospital Stay: Payer: Medicare Other | Attending: Hematology & Oncology

## 2021-01-29 VITALS — BP 139/71 | HR 71 | Temp 98.1°F | Resp 17

## 2021-01-29 DIAGNOSIS — C187 Malignant neoplasm of sigmoid colon: Secondary | ICD-10-CM | POA: Insufficient documentation

## 2021-01-29 DIAGNOSIS — Z452 Encounter for adjustment and management of vascular access device: Secondary | ICD-10-CM | POA: Diagnosis not present

## 2021-01-29 DIAGNOSIS — C185 Malignant neoplasm of splenic flexure: Secondary | ICD-10-CM

## 2021-01-29 MED ORDER — SODIUM CHLORIDE 0.9% FLUSH
10.0000 mL | INTRAVENOUS | Status: DC | PRN
  Administered 2021-01-29: 10 mL via INTRAVENOUS

## 2021-01-29 MED ORDER — HEPARIN SOD (PORK) LOCK FLUSH 100 UNIT/ML IV SOLN
500.0000 [IU] | Freq: Once | INTRAVENOUS | Status: AC
  Administered 2021-01-29: 500 [IU] via INTRAVENOUS

## 2021-01-29 NOTE — Patient Instructions (Signed)

## 2021-02-23 ENCOUNTER — Other Ambulatory Visit: Payer: Self-pay | Admitting: Gastroenterology

## 2021-03-01 ENCOUNTER — Other Ambulatory Visit: Payer: Self-pay | Admitting: Hematology & Oncology

## 2021-03-01 DIAGNOSIS — G609 Hereditary and idiopathic neuropathy, unspecified: Secondary | ICD-10-CM

## 2021-03-01 MED ORDER — GABAPENTIN 400 MG PO CAPS: ORAL_CAPSULE | ORAL | 1 refills | Status: DC

## 2021-04-12 ENCOUNTER — Other Ambulatory Visit: Payer: Self-pay

## 2021-04-12 ENCOUNTER — Inpatient Hospital Stay: Payer: Medicare Other

## 2021-04-12 ENCOUNTER — Encounter (HOSPITAL_BASED_OUTPATIENT_CLINIC_OR_DEPARTMENT_OTHER): Payer: Self-pay

## 2021-04-12 ENCOUNTER — Telehealth: Payer: Self-pay

## 2021-04-12 ENCOUNTER — Ambulatory Visit (HOSPITAL_BASED_OUTPATIENT_CLINIC_OR_DEPARTMENT_OTHER)
Admission: RE | Admit: 2021-04-12 | Discharge: 2021-04-12 | Disposition: A | Discharge status: Home / Self Care | Payer: Medicare Other | Source: Ambulatory Visit | Attending: Hematology & Oncology | Admitting: Hematology & Oncology

## 2021-04-12 ENCOUNTER — Inpatient Hospital Stay: Payer: Medicare Other | Attending: Hematology & Oncology

## 2021-04-12 ENCOUNTER — Inpatient Hospital Stay (HOSPITAL_BASED_OUTPATIENT_CLINIC_OR_DEPARTMENT_OTHER): Payer: Medicare Other | Admitting: Hematology & Oncology

## 2021-04-12 ENCOUNTER — Encounter: Payer: Self-pay | Admitting: Hematology & Oncology

## 2021-04-12 VITALS — BP 147/68 | HR 75 | Temp 98.2°F | Resp 20 | Ht 76.0 in | Wt 259.0 lb

## 2021-04-12 DIAGNOSIS — Z79899 Other long term (current) drug therapy: Secondary | ICD-10-CM | POA: Insufficient documentation

## 2021-04-12 DIAGNOSIS — D500 Iron deficiency anemia secondary to blood loss (chronic): Secondary | ICD-10-CM

## 2021-04-12 DIAGNOSIS — Z888 Allergy status to other drugs, medicaments and biological substances status: Secondary | ICD-10-CM | POA: Insufficient documentation

## 2021-04-12 DIAGNOSIS — C184 Malignant neoplasm of transverse colon: Secondary | ICD-10-CM

## 2021-04-12 DIAGNOSIS — C187 Malignant neoplasm of sigmoid colon: Secondary | ICD-10-CM | POA: Diagnosis present

## 2021-04-12 DIAGNOSIS — Z91030 Bee allergy status: Secondary | ICD-10-CM | POA: Insufficient documentation

## 2021-04-12 DIAGNOSIS — G629 Polyneuropathy, unspecified: Secondary | ICD-10-CM | POA: Diagnosis not present

## 2021-04-12 DIAGNOSIS — C186 Malignant neoplasm of descending colon: Secondary | ICD-10-CM | POA: Insufficient documentation

## 2021-04-12 DIAGNOSIS — Z881 Allergy status to other antibiotic agents status: Secondary | ICD-10-CM | POA: Insufficient documentation

## 2021-04-12 DIAGNOSIS — K922 Gastrointestinal hemorrhage, unspecified: Secondary | ICD-10-CM

## 2021-04-12 LAB — CBC WITH DIFFERENTIAL (CANCER CENTER ONLY)
Abs Immature Granulocytes: 0.03 10*3/uL (ref 0.00–0.07)
Basophils Absolute: 0 10*3/uL (ref 0.0–0.1)
Basophils Relative: 1 %
Eosinophils Absolute: 0.1 10*3/uL (ref 0.0–0.5)
Eosinophils Relative: 1 %
HCT: 39.5 % (ref 39.0–52.0)
Hemoglobin: 13.3 g/dL (ref 13.0–17.0)
Immature Granulocytes: 1 %
Lymphocytes Relative: 25 %
Lymphs Abs: 1.5 10*3/uL (ref 0.7–4.0)
MCH: 30.6 pg (ref 26.0–34.0)
MCHC: 33.7 g/dL (ref 30.0–36.0)
MCV: 90.8 fL (ref 80.0–100.0)
Monocytes Absolute: 0.4 10*3/uL (ref 0.1–1.0)
Monocytes Relative: 7 %
Neutro Abs: 4 10*3/uL (ref 1.7–7.7)
Neutrophils Relative %: 65 %
Platelet Count: 203 10*3/uL (ref 150–400)
RBC: 4.35 MIL/uL (ref 4.22–5.81)
RDW: 13.7 % (ref 11.5–15.5)
WBC Count: 6 10*3/uL (ref 4.0–10.5)
nRBC: 0 % (ref 0.0–0.2)

## 2021-04-12 LAB — CMP (CANCER CENTER ONLY)
ALT: 25 U/L (ref 0–44)
AST: 23 U/L (ref 15–41)
Albumin: 4 g/dL (ref 3.5–5.0)
Alkaline Phosphatase: 68 U/L (ref 38–126)
Anion gap: 7 (ref 5–15)
BUN: 16 mg/dL (ref 8–23)
CO2: 26 mmol/L (ref 22–32)
Calcium: 9 mg/dL (ref 8.9–10.3)
Chloride: 105 mmol/L (ref 98–111)
Creatinine: 1.04 mg/dL (ref 0.61–1.24)
GFR, Estimated: >60 mL/min (ref >60)
Glucose, Bld: 90 mg/dL (ref 70–99)
Potassium: 4 mmol/L (ref 3.5–5.1)
Sodium: 138 mmol/L (ref 135–145)
Total Bilirubin: 1.1 mg/dL (ref 0.3–1.2)
Total Protein: 6.6 g/dL (ref 6.5–8.1)

## 2021-04-12 LAB — CEA (IN HOUSE-CHCC): CEA (CHCC-In House): <1 ng/mL (ref 0.00–5.00)

## 2021-04-12 LAB — LACTATE DEHYDROGENASE: LDH: 118 U/L (ref 98–192)

## 2021-04-12 MED ORDER — HEPARIN SOD (PORK) LOCK FLUSH 100 UNIT/ML IV SOLN
500.0000 [IU] | Freq: Once | INTRAVENOUS | Status: AC | PRN
  Administered 2021-04-12: 500 [IU]

## 2021-04-12 MED ORDER — SODIUM CHLORIDE 0.9% FLUSH
10.0000 mL | Freq: Once | INTRAVENOUS | Status: AC | PRN
  Administered 2021-04-12: 10 mL

## 2021-04-12 MED ORDER — IOHEXOL 300 MG/ML  SOLN
100.0000 mL | Freq: Once | INTRAMUSCULAR | Status: AC | PRN
  Administered 2021-04-12: 100 mL via INTRAVENOUS

## 2021-04-12 NOTE — Progress Notes (Signed)
Hematology and Oncology Follow Up Visit  John [name] [id number] [birth year] 66 y.o. 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 is back for follow-up.  So far, everything is going well.  He has right knee surgery in late October.  Everything went well with this.  The knee is doing better.  He had his CT scan done today.  Thankfully, the CT scan not show any evidence of recurrent colon cancer.  He is eating well.  He is having no problems with nausea or vomiting.  He is having no issues with bowels or bladder.  There is no cough or shortness of breath.  He has had no rashes.  There is no fever.  May have little bit of swelling in his ankles.    His last CEA level was less than 1 back in October.  He still has a neuropathy.  He is on gabapentin.  We will increase the gabapentin dose to 400 mg p.o. 4 times daily.  Overall, I would say his performance status is ECOG 1.      Medications:  Current Outpatient Medications:    b complex vitamins capsule, Take 1 capsule by mouth daily., Disp: , Rfl:    gabapentin (NEURONTIN) 400 MG capsule, TAKE ONE CAPSULE BY MOUTH THREE TIMES DAILY, Disp: 90 capsule, Rfl: 1   pantoprazole (PROTONIX) 40 MG tablet, Take 1 tablet (40 mg total) by mouth 2 (two) times daily. Please call [phone] to schedule an office visit for more refills (Patient taking differently: Take 40 mg by mouth daily. Please call [phone] to schedule an office visit for more refills), Disp: 180 tablet, Rfl: 1   telmisartan (MICARDIS) 40 MG tablet, Take 40 mg by mouth daily., Disp: , Rfl:    VITAMIN D, CHOLECALCIFEROL, PO, Take 1,000 Int'l Units/day by mouth daily., Disp: , Rfl:    cetirizine (ZYRTEC) 10 MG chewable tablet, Chew 10 mg by mouth daily as  needed. (Patient not taking: Reported on 04/12/2021), Disp: , Rfl:    ondansetron (ZOFRAN) 8 MG tablet, Take 1 tablet (8 mg total) by mouth 2 (two) times daily as needed for refractory nausea / vomiting. Start on day 3 after chemotherapy. (Patient not taking: No sig reported), Disp: 30 tablet, Rfl: 1   prochlorperazine (COMPAZINE) 10 MG tablet, TAKE 1 TABLET(10 MG) BY MOUTH EVERY 6 HOURS AS NEEDED FOR NAUSEA OR VOMITING (Patient not taking: No sig reported), Disp: 30 tablet, Rfl: 1  Allergies:  Allergies  Allergen Reactions   Other Swelling    Bee Sting  Bee Sting    Erythromycin     Upset stomach    Lisinopril Cough    Past Medical History, Surgical history, Social history, and Family History were reviewed and updated.  Review of Systems: Review of Systems  Constitutional: Negative.   HENT:  Negative.  Negative for voice change.   Eyes: Negative.   Respiratory: Negative.    Cardiovascular: Negative.   Gastrointestinal: Negative.   Endocrine: Negative.   Genitourinary: Negative.    Musculoskeletal: Negative.   Skin: Negative.   Neurological: Negative.   Hematological: Negative.   Psychiatric/Behavioral: Negative.     Physical Exam:  height is 6\' 4"  (1.93 m) and weight is 259 lb (117.5 kg). His oral temperature is 98.2 F (36.8 C). His  blood pressure is 147/68 (abnormal) and his pulse is 75. His respiration is 20 and oxygen saturation is 99%.   Wt Readings from Last 3 Encounters:  04/12/21 259 lb (117.5 kg)  11/30/20 265 lb (120.2 kg)  08/10/20 271 lb (122.9 kg)    Physical Exam Vitals reviewed.  HENT:     Head: Normocephalic and atraumatic.  Eyes:     Pupils: Pupils are equal, round, and reactive to light.  Cardiovascular:     Rate and Rhythm: Normal rate and regular rhythm.     Heart sounds: Normal heart sounds.  Pulmonary:     Effort: Pulmonary effort is normal.     Breath sounds: Normal breath sounds.  Abdominal:     General: Bowel sounds are normal.      Palpations: Abdomen is soft.     Comments: Abdominal exam shows well-healed laparoscopy scars.  He has no abdominal distention.  He has good bowel sounds.  There is no fluid wave.  There is no palpable liver or spleen tip.  Musculoskeletal:        General: No tenderness or deformity. Normal range of motion.     Cervical back: Normal range of motion.  Lymphadenopathy:     Cervical: No cervical adenopathy.  Skin:    General: Skin is warm and dry.     Findings: No erythema or rash.  Neurological:     Mental Status: He is alert and oriented to person, place, and time.  Psychiatric:        Behavior: Behavior normal.        Thought Content: Thought content normal.        Judgment: Judgment normal.     Lab Results  Component Value Date   WBC 6.0 04/12/2021   HGB 13.3 04/12/2021   HCT 39.5 04/12/2021   MCV 90.8 04/12/2021   PLT 203 04/12/2021     Chemistry      Component Value Date/Time   NA 138 04/12/2021 0835   K 4.0 04/12/2021 0835   CL 105 04/12/2021 0835   CO2 26 04/12/2021 0835   BUN 16 04/12/2021 0835   CREATININE 1.04 04/12/2021 0835   CREATININE 1.14 12/04/2019 1310      Component Value Date/Time   CALCIUM 9.0 04/12/2021 0835   ALKPHOS 68 04/12/2021 0835   AST 23 04/12/2021 0835   ALT 25 04/12/2021 0835   BILITOT 1.1 04/12/2021 0835      Impression and Plan: [name] is a a very nice 66 year old white male.  He has stage IIIb sigmoid colon cancer.  He completed all this adjuvant chemotherapy.  This was back in May.  Everything looks fantastic.  CAT scan looks great.  I am not surprised.  I just wish that his neuropathy would get a little bit better.  We will plan for another CT scan probably in June or July.  I think this would be reasonable.  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  It sounds like he will have a nice crop this year once they all mature.    Volanda Napoleon, MD 2/27/202311:14 AM

## 2021-04-12 NOTE — Telephone Encounter (Signed)
Left message for pt to call back  °

## 2021-04-12 NOTE — Patient Instructions (Signed)

## 2021-04-12 NOTE — Telephone Encounter (Signed)
-----   Message from [place], DO sent at 04/12/2021 11:50 AM EST ----- Can you please schedule this patient for f/u appt with me for ongoing colon cancer surveillance and to get set up for repeat colonoscopy? Thanks!

## 2021-04-13 ENCOUNTER — Encounter: Payer: Self-pay | Admitting: General Practice

## 2021-04-13 NOTE — Telephone Encounter (Signed)
Pt is scheduled for f/u appt on 04/22/21 at 10:40 am. Pt verbalized understanding and had no other concerns at end of call.

## 2021-04-13 NOTE — Progress Notes (Signed)
[place] Spiritual Care Note  Received a call from [place] to share updates and thank chaplain for support during treatment last year. 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 He plans to follow up by phone as needed/desired.   [place], [place], [facility] Pager [phone] Voicemail [phone]

## 2021-04-22 ENCOUNTER — Encounter: Payer: Self-pay | Admitting: Gastroenterology

## 2021-04-22 ENCOUNTER — Ambulatory Visit (INDEPENDENT_AMBULATORY_CARE_PROVIDER_SITE_OTHER): Payer: Medicare Other | Admitting: Gastroenterology

## 2021-04-22 VITALS — BP 138/78 | HR 67 | Ht 76.0 in | Wt 261.0 lb

## 2021-04-22 DIAGNOSIS — K219 Gastro-esophageal reflux disease without esophagitis: Secondary | ICD-10-CM | POA: Diagnosis not present

## 2021-04-22 DIAGNOSIS — C189 Malignant neoplasm of colon, unspecified: Secondary | ICD-10-CM

## 2021-04-22 MED ORDER — PANTOPRAZOLE SODIUM 40 MG PO TBEC
40.0000 mg | DELAYED_RELEASE_TABLET | Freq: Every day | ORAL | 4 refills | Status: AC
Start: 2021-04-22 — End: ?

## 2021-04-22 NOTE — Patient Instructions (Addendum)
If you are age 68 or older, your body mass index should be between 23-30. Your Body mass index is 31.77 kg/m?Marland Kitchen If this is out of the aforementioned range listed, please consider follow up with your Primary Care Provider. ? ?If you are age 81 or younger, your body mass index should be between 19-25. Your Body mass index is 31.77 kg/m?Marland Kitchen If this is out of the aformentioned range listed, please consider follow up with your Primary Care Provider.  ? ?________________________________________________________ ? ?The [facility] providers would like to encourage you to use [facility] to communicate with providers for non-urgent requests or questions.  Due to long hold times on the telephone, sending your provider a message by [facility] may be a faster and more efficient way to get a response.  Please allow 48 business hours for a response.  Please remember that this is for non-urgent requests.  ?_______________________________________________________ ? ?We have sent the following medications to your pharmacy for you to pick up at your convenience: ?Protonix ? ?You have been scheduled for a colonoscopy. Please follow written instructions given to you at your visit today.  ?Please pick up your prep supplies at the pharmacy within the next 1-3 days. ?If you use inhalers (even only as needed), please bring them with you on the day of your procedure. ? ?We have given you samples of the following medication to take: ?Clenpiq ? ?Please call with any questions or concerns. ? ?It was a pleasure to see you today! ? ?Gerrit Heck, D.O. ? ?

## 2021-04-22 NOTE — Progress Notes (Signed)
? ?Chief Complaint:    GERD, medication refill, colon cancer surveillance ? 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 ?  ?-CEA normal ?-CT C/A/P: 4.8 x 4.3 x 4.4 cm sigmoid mass, multiple prominent subcentimeter lymph nodes of the sigmoid mesocolon adjacent to the mass measuring no greater than 3 mm, concerning for nodal metastatic disease.  Subtle, hypodense lesion of the lateral liver dome measuring 1.5 x 1.5 cm, suspicious for hepatic metastatic disease.  Consider MRI or PET/CT.  Multiple tiny centrilobular pulmonary nodules ?- MRI abdomen: No metastatic disease, benign liver hemangioma ?- Resection 01/28/2020 ?- Adjuvant chemotherapy with FOLFOX x8 cycles in 2022 ? ? ? ?Separately, history of GERD with erosive esophagitis and gastritis.  Treated with high-dose PPI x8 weeks and Carafate in 2021.  Reflux has since been well controlled with Protonix 40 mg/day. ?  ?  ?Endoscopic History: ?-EGD (12/04/2019, Dr. Bryan Lemma): Moderate esophagitis at [facility] (path: Reflux changes), severe gastritis (path: Non-H. pylori gastritis), 4 mm DU, normal D2 (path benign).  Started on Protonix and sucralfate ?-Colonoscopy (12/04/2019, Dr. Bryan Lemma): Obstructing mass in the sigmoid colon (path: Adenocarcinoma; tattoo placed 2-3 cm distal to the mass), inflammatory polyp, diverticulosis, grade 3 hemorrhoids ? ?HPI:   ? ? ?Patient is a 66 y.o. male presenting to the Gastroenterology Clinic for follow-up.  Last seen by me on 12/23/2019. ? ?GERD well controlled with Protonix 40 mg/day. Occasional breakthrough with spicy foods, and will either take a 2nd Protox or Tums with relief. Only 2-3 times/month with that breakthrough. No dysphagia.  ? ?For his colon cancer, he is now s/p resection in 01/2020 and completed adjuvant chemotherapy with FOLFOX x8 cycles.  Was last seen by Dr. Marin Olp on 04/12/2021.  Surveillance CT C/A/P last month was  without recurrence.  Still with some chemo associated neuropathy. ? ?No lower GI sxs.  ? ?TKR in October and feeling well since then.  ? ? ?CBC Latest Ref Rng & Units 04/12/2021 11/30/2020 08/10/2020  ?WBC 4.0 - 10.5 K/uL 6.0 6.8 4.3  ?Hemoglobin 13.0 - 17.0 g/dL 13.3 13.9 12.0(L)  ?Hematocrit 39.0 - 52.0 % 39.5 40.8 36.3(L)  ?Platelets 150 - 400 K/uL 203 184 145(L)  ? ? ? ?Review of systems:     No chest pain, no SOB, no fevers, no urinary sx  ? ?Past Medical History:  ?Diagnosis Date  ? Anemia   ? Arthritis   ? Cancer of right kidney [facility])   ? Lt. nephrectomy  ? Colon cancer ([place]) 12/11/2019  ? Family history of colon cancer   ? Family history of prostate cancer   ? Hypertension   ? Lower GI bleed 12/11/2019  ? Multiple thyroid nodules   ? Personal history of kidney cancer   ? Stage III carcinoma of colon ([place]) 02/28/2020  ? ? ?Patient's surgical history, family medical history, social history, medications and allergies were all reviewed in Epic  ? ? ?Current Outpatient Medications  ?Medication Sig Dispense Refill  ? b complex vitamins capsule Take 1 capsule by mouth daily.    ? gabapentin (NEURONTIN) 400 MG capsule TAKE ONE CAPSULE BY MOUTH THREE TIMES DAILY 90 capsule 1  ? pantoprazole (PROTONIX) 40 MG tablet Take 1 tablet (40 mg total) by mouth 2 (two) times daily. Please call [phone] to schedule an office visit for more refills (Patient taking differently: Take 40 mg by mouth daily. Please call [phone] to schedule an  office visit for more refills) 180 tablet 1  ? telmisartan (MICARDIS) 40 MG tablet Take 40 mg by mouth daily.    ? VITAMIN D, CHOLECALCIFEROL, PO Take 1,000 Int'l Units/day by mouth daily.    ? cetirizine (ZYRTEC) 10 MG chewable tablet Chew 10 mg by mouth daily as needed. (Patient not taking: Reported on 04/12/2021)    ? ?No current facility-administered medications for this visit.  ? ? ?Physical Exam:   ? ? ?BP 138/78   Pulse 67   Ht '6\' 4"'$  (1.93 m)   Wt 261 lb (118.4 kg)   SpO2 98%    BMI 31.77 kg/m?  ? ?GENERAL:  Pleasant male in NAD ?PSYCH: : Cooperative, normal affect ?Musculoskeletal:  Normal muscle tone, normal strength ?NEURO: Alert and oriented x 3, no focal neurologic deficits ? ? ?IMPRESSION and PLAN:   ? ?1) History of colon cancer ?History of stage III sigmoid colon cancer, now status post sigmoid resection and adjuvant chemotherapy ?- Repeat colonoscopy for ongoing surveillance ? ?2) GERD with erosive esophagitis ?- Well-controlled on current therapy ?- Continue Protonix 40 mg/day.  Refill placed today ?- Okay to use Tums on demand if breakthrough symptoms ?- Continue antireflux lifestyle/dietary modifications ? ? ?The indications, risks, and benefits of colonoscopy were explained to the patient in detail. Risks include but are not limited to bleeding, perforation, adverse reaction to medications, and cardiopulmonary compromise. Sequelae include but are not limited to the possibility of surgery, hospitalization, and mortality. The patient verbalized understanding and wished to proceed. All questions answered, referred to the scheduler and bowel prep ordered. Further recommendations pending results of the exam.  ? ? ?Lavena Bullion ,DO, FACG 04/22/2021, 11:02 AM ? ?

## 2021-05-01 ENCOUNTER — Other Ambulatory Visit: Payer: Self-pay | Admitting: Hematology & Oncology

## 2021-05-01 DIAGNOSIS — G609 Hereditary and idiopathic neuropathy, unspecified: Secondary | ICD-10-CM

## 2021-05-03 ENCOUNTER — Encounter: Payer: Self-pay | Admitting: Hematology & Oncology

## 2021-05-10 ENCOUNTER — Ambulatory Visit (AMBULATORY_SURGERY_CENTER): Payer: Medicare Other | Admitting: Gastroenterology

## 2021-05-10 ENCOUNTER — Encounter: Payer: Self-pay | Admitting: Gastroenterology

## 2021-05-10 ENCOUNTER — Other Ambulatory Visit: Payer: Self-pay

## 2021-05-10 VITALS — BP 137/77 | HR 68 | Temp 98.9°F | Resp 16 | Ht 76.0 in | Wt 261.0 lb

## 2021-05-10 DIAGNOSIS — D122 Benign neoplasm of ascending colon: Secondary | ICD-10-CM

## 2021-05-10 DIAGNOSIS — D124 Benign neoplasm of descending colon: Secondary | ICD-10-CM

## 2021-05-10 DIAGNOSIS — D120 Benign neoplasm of cecum: Secondary | ICD-10-CM | POA: Diagnosis not present

## 2021-05-10 DIAGNOSIS — Z85038 Personal history of other malignant neoplasm of large intestine: Secondary | ICD-10-CM | POA: Diagnosis not present

## 2021-05-10 DIAGNOSIS — K5730 Diverticulosis of large intestine without perforation or abscess without bleeding: Secondary | ICD-10-CM

## 2021-05-10 DIAGNOSIS — D123 Benign neoplasm of transverse colon: Secondary | ICD-10-CM | POA: Diagnosis not present

## 2021-05-10 DIAGNOSIS — C189 Malignant neoplasm of colon, unspecified: Secondary | ICD-10-CM

## 2021-05-10 DIAGNOSIS — K642 Third degree hemorrhoids: Secondary | ICD-10-CM

## 2021-05-10 MED ORDER — SODIUM CHLORIDE 0.9 % IV SOLN: 500.0000 mL | Freq: Once | INTRAVENOUS | Status: DC

## 2021-05-10 NOTE — Progress Notes (Signed)
Pt's states no medical or surgical changes since previsit or office visit. 

## 2021-05-10 NOTE — Progress Notes (Signed)
D.T. vital signs. °

## 2021-05-10 NOTE — Patient Instructions (Signed)
Handout on polyps, diverticulosis, and hemorrhoids provided  ? ?Await pathology results.  ? ?Continue current medications.  ? ?YOU HAD AN ENDOSCOPIC PROCEDURE TODAY AT [facility]:   Refer to the procedure report that was given to you for any specific questions about what was found during the examination.  If the procedure report does not answer your questions, please call your gastroenterologist to clarify.  If you requested that your care partner not be given the details of your procedure findings, then the procedure report has been included in a sealed envelope for you to review at your convenience later. ? ?YOU SHOULD EXPECT: Some feelings of bloating in the abdomen. Passage of more gas than usual.  Walking can help get rid of the air that was put into your GI tract during the procedure and reduce the bloating. If you had a lower endoscopy (such as a colonoscopy or flexible sigmoidoscopy) you may notice spotting of blood in your stool or on the toilet paper. If you underwent a bowel prep for your procedure, you may not have a normal bowel movement for a few days. ? ?Please Note:  You might notice some irritation and congestion in your nose or some drainage.  This is from the oxygen used during your procedure.  There is no need for concern and it should clear up in a day or so. ? ?SYMPTOMS TO REPORT IMMEDIATELY: ? ?Following lower endoscopy (colonoscopy or flexible sigmoidoscopy): ? Excessive amounts of blood in the stool ? Significant tenderness or worsening of abdominal pains ? Swelling of the abdomen that is new, acute ? Fever of 100?F or higher ? ?For urgent or emergent issues, a gastroenterologist can be reached at any hour by calling [phone]. ?Do not use MyChart messaging for urgent concerns.  ? ? ?DIET:  We do recommend a small meal at first, but then you may proceed to your regular diet.  Drink plenty of fluids but you should avoid alcoholic beverages for 24 hours. ? ?ACTIVITY:   You should plan to take it easy for the rest of today and you should NOT DRIVE or use heavy machinery until tomorrow (because of the sedation medicines used during the test).   ? ?FOLLOW UP: ?Our staff will call the number listed on your records 48-72 hours following your procedure to check on you and address any questions or concerns that you may have regarding the information given to you following your procedure. If we do not reach you, we will leave a message.  We will attempt to reach you two times.  During this call, we will ask if you have developed any symptoms of COVID 19. If you develop any symptoms (ie: fever, flu-like symptoms, shortness of breath, cough etc.) before then, please call [phone].  If you test positive for Covid 19 in the 2 weeks post procedure, please call and report this information to [place].   ? ?If any biopsies were taken you will be contacted by phone or by letter within the next 1-3 weeks.  Please call us at [phone] if you have not heard about the biopsies in 3 weeks.  ? ? ?SIGNATURES/CONFIDENTIALITY: ?You and/or your care partner have signed paperwork which will be entered into your electronic medical record.  These signatures attest to the fact that that the information above on your After Visit Summary has been reviewed and is understood.  Full responsibility of the confidentiality of this discharge information lies with you and/or your care-partner. ? ? ?

## 2021-05-10 NOTE — Progress Notes (Signed)
To Pacu, VSS. Report to [email or web address] 

## 2021-05-10 NOTE — Op Note (Signed)
[place] ?Patient Name: John [name] ?Procedure Date: 05/10/2021 2:30 PM ?MRN: [id number] ?Endoscopist: Gerrit Heck , MD ?Age: 66 ?Referring MD:  ?Date of Birth: 11-19-1955 ?Gender: Male ?Account #: [id number] ?Procedure:                Colonoscopy ?Indications:              High risk colon cancer surveillance: Personal  ?                          history of colon cancer ?                          66 year old male with stage III (T3 N1 M0) sigmoid  ?                          colon cancer diagnosed on index colonoscopy, now  ?                          s/p resection and adjuvant chemotherapy, presents  ?                          for ongoing surveillance. ?Medicines:                Monitored Anesthesia Care ?Procedure:                Pre-Anesthesia Assessment: ?                          - Prior to the procedure, a History and Physical  ?                          was performed, and patient medications and  ?                          allergies were reviewed. The patient's tolerance of  ?                          previous anesthesia was also reviewed. The risks  ?                          and benefits of the procedure and the sedation  ?                          options and risks were discussed with the patient.  ?                          All questions were answered, and informed consent  ?                          was obtained. Prior Anticoagulants: The patient has  ?                          taken no previous anticoagulant or antiplatelet  ?  agents. ASA Grade Assessment: II - A patient with  ?                          mild systemic disease. After reviewing the risks  ?                          and benefits, the patient was deemed in  ?                          satisfactory condition to undergo the procedure. ?                          After obtaining informed consent, the colonoscope  ?                          was passed under direct vision. Throughout the  ?                           procedure, the patient's blood pressure, pulse, and  ?                          oxygen saturations were monitored continuously. The  ?                          CF [id number] #[id number] was introduced through the anus  ?                          and advanced to the the terminal ileum. The  ?                          colonoscopy was performed without difficulty. The  ?                          patient tolerated the procedure well. The quality  ?                          of the bowel preparation was good. The terminal  ?                          ileum, ileocecal valve, appendiceal orifice, and  ?                          rectum were photographed. ?Scope In: 2:36:20 PM ?Scope Out: 2:54:44 PM ?Scope Withdrawal Time: 0 hours 15 minutes 26 seconds  ?Total Procedure Duration: 0 hours 18 minutes 24 seconds  ?Findings:                 Hemorrhoids were found on perianal exam. ?                          There was evidence of a prior end-to-end  ?                          colo-colonic anastomosis in the sigmoid colon,  ?  located 28 cm from the anal verge. This was patent  ?                          and was characterized by healthy appearing mucosa.  ?                          The anastomosis was traversed. ?                          Six sessile polyps were found in the descending  ?                          colon (2), transverse colon (2), ascending colon,  ?                          and cecum. The polyps were 3 to 6 mm in size. These  ?                          polyps were removed with a cold snare. Resection  ?                          and retrieval were complete. Estimated blood loss  ?                          was minimal. ?                          A few small-mouthed diverticula were found in the  ?                          sigmoid colon and descending colon. ?                          Non-bleeding internal hemorrhoids were found during  ?                          retroflexion. The hemorrhoids  were medium-sized and  ?                          Grade III (internal hemorrhoids that prolapse but  ?                          require manual reduction). ?                          The terminal ileum appeared normal. ?Complications:            No immediate complications. ?Estimated Blood Loss:     Estimated blood loss was minimal. ?Impression:               - Hemorrhoids found on perianal exam. ?                          - Patent end-to-end colo-colonic anastomosis,  ?  characterized by healthy appearing mucosa. ?                          - Six 3 to 6 mm polyps in the descending colon, in  ?                          the transverse colon, in the ascending colon and in  ?                          the cecum, removed with a cold snare. Resected and  ?                          retrieved. ?                          - Diverticulosis in the sigmoid colon and in the  ?                          descending colon. ?                          - Non-bleeding internal hemorrhoids. ?                          - The examined portion of the ileum was normal. ?Recommendation:           - Patient has a contact number available for  ?                          emergencies. The signs and symptoms of potential  ?                          delayed complications were discussed with the  ?                          patient. Return to normal activities tomorrow.  ?                          Written discharge instructions were provided to the  ?                          patient. ?                          - Resume previous diet. ?                          - Continue present medications. ?                          - Await pathology results. ?                          - Repeat colonoscopy for surveillance based on  ?  pathology results. ?                          - Return to GI office PRN. ?Gerrit Heck, MD ?05/10/2021 3:01:27 PM ?

## 2021-05-10 NOTE — Progress Notes (Signed)
? ?GASTROENTEROLOGY PROCEDURE H&P NOTE  ? ?Primary Care Physician: ?Josem Kaufmann, MD ? ? ? ?Reason for Procedure:   Colon cancer surveillance ? ?Plan:    Colonoscopy ? ?Patient is appropriate for endoscopic procedure(s) in the ambulatory ([place]) setting. ? ?The nature of the procedure, as well as the risks, benefits, and alternatives were carefully and thoroughly reviewed with the patient. Ample time for discussion and questions allowed. The patient understood, was satisfied, and agreed to proceed.  ? ? ? ?HPI: ?John [name] is a 66 y.o. male who presents for Colonoscopy for ongoing colon cancer surveillance.  Patient was most recently seen in the Gastroenterology Clinic on 04/22/2021 by me.  No interval change in medical history since that appointment. Please refer to that note for full details regarding GI history and clinical presentation.  ? ?Past Medical History:  ?Diagnosis Date  ? Anemia   ? Arthritis   ? Cancer of right kidney [facility])   ? Lt. nephrectomy  ? Colon cancer ([place]) 12/11/2019  ? Family history of colon cancer   ? Family history of prostate cancer   ? Hypertension   ? Lower GI bleed 12/11/2019  ? Multiple thyroid nodules   ? Personal history of kidney cancer   ? Stage III carcinoma of colon ([place]) 02/28/2020  ? ? ?Past Surgical History:  ?Procedure Laterality Date  ? CHOLECYSTECTOMY    ? COLONOSCOPY  12/04/2019  ? FLEXIBLE SIGMOIDOSCOPY N/A 01/31/2020  ? Procedure: FLEXIBLE SIGMOIDOSCOPY;  Surgeon: Ileana Roup, MD;  Location: [facility];  Service: General;  Laterality: N/A;  ? HERNIA REPAIR    ? INCISION AND DRAINAGE / EXCISION THYROGLOSSAL CYST    ? IR IMAGING GUIDED PORT INSERTION  03/05/2020  ? LYSIS OF ADHESION N/A 01/31/2020  ? Procedure: LYSIS OF ADHESION;  Surgeon: Ileana Roup, MD;  Location: [facility];  Service: General;  Laterality: N/A;  ? MENISCUS REPAIR Left   ? MENISECTOMY Right   ? NEPHRECTOMY RADICAL    ? REPLACEMENT TOTAL KNEE Right 12/10/2020  ? THYROIDECTOMY, PARTIAL    ?  UPPER GASTROINTESTINAL ENDOSCOPY  12/04/2019  ? ? ?Prior to Admission medications   ?Medication Sig Start Date End Date Taking? Authorizing Provider  ?b complex vitamins capsule Take 1 capsule by mouth daily.   Yes [provider]  ?cetirizine (ZYRTEC) 10 MG chewable tablet Chew 10 mg by mouth daily as needed.   Yes [provider]  ?gabapentin (NEURONTIN) 400 MG capsule TAKE 1 CAPSULE BY MOUTH THREE TIMES DAILY 05/03/21  Yes Ennever, Rudell Cobb, MD  ?pantoprazole (PROTONIX) 40 MG tablet Take 1 tablet (40 mg total) by mouth daily. 04/22/21  Yes Ellsie Violette V, DO  ?telmisartan (MICARDIS) 40 MG tablet Take 40 mg by mouth daily. 09/09/19  Yes [provider]  ?VITAMIN D, CHOLECALCIFEROL, PO Take 1,000 Int'l Units/day by mouth daily.   Yes [provider]  ? ? ?Current Outpatient Medications  ?Medication Sig Dispense Refill  ? b complex vitamins capsule Take 1 capsule by mouth daily.    ? cetirizine (ZYRTEC) 10 MG chewable tablet Chew 10 mg by mouth daily as needed.    ? gabapentin (NEURONTIN) 400 MG capsule TAKE 1 CAPSULE BY MOUTH THREE TIMES DAILY 90 capsule 4  ? pantoprazole (PROTONIX) 40 MG tablet Take 1 tablet (40 mg total) by mouth daily. 90 tablet 4  ? telmisartan (MICARDIS) 40 MG tablet Take 40 mg by mouth daily.    ? VITAMIN D, CHOLECALCIFEROL, PO Take  1,000 Int'l Units/day by mouth daily.    ? ?Current Facility-Administered Medications  ?Medication Dose Route Frequency Provider Last Rate Last Admin  ? 0.9 %  sodium chloride infusion  500 mL Intravenous Once Leahanna Buser V, DO      ? ? ?Allergies as of 05/10/2021 - Review Complete 05/10/2021  ?Allergen Reaction Noted  ? Other Swelling 12/23/2019  ? Erythromycin  12/23/2019  ? Lisinopril Cough 10/31/2019  ? ? ?Family History  ?Problem Relation Age of Onset  ? Valvular heart disease Father   ? Colon cancer Maternal Grandfather 90  ? Prostate cancer Maternal Grandfather 90  ? Stroke Maternal Grandfather   ? Liver cancer  Paternal Uncle   ?     probable mets, unknown primary  ? Dementia Paternal Uncle   ? Stroke Maternal Grandmother   ? Stroke Paternal Grandmother   ? AAA (abdominal aortic aneurysm) Paternal Grandmother   ? Dementia Paternal Grandfather   ? Other Paternal Uncle   ?     Amyloidosis  ? Colon polyps Neg Hx   ? Esophageal cancer Neg Hx   ? Rectal cancer Neg Hx   ? Stomach cancer Neg Hx   ? ? ?Social History  ? ?Socioeconomic History  ? Marital status: Married  ?  Spouse name: Not on file  ? Number of children: Not on file  ? Years of education: Not on file  ? Highest education level: Not on file  ?Occupational History  ? Not on file  ?Tobacco Use  ? Smoking status: Former  ?  Types: Cigarettes  ?  Quit date: 41  ?  Years since quitting: 30.2  ? Smokeless tobacco: Never  ?Vaping Use  ? Vaping Use: Never used  ?Substance and Sexual Activity  ? Alcohol use: Yes  ?  Comment: occ  ? Drug use: Never  ? Sexual activity: Not on file  ?Other Topics Concern  ? Not on file  ?Social History Narrative  ? Not on file  ? ?Social Determinants of Health  ? ?Financial Resource Strain: Not on file  ?Food Insecurity: Not on file  ?Transportation Needs: Not on file  ?Physical Activity: Not on file  ?Stress: Not on file  ?Social Connections: Not on file  ?Intimate Partner Violence: Not on file  ? ? ?Physical Exam: ?Vital signs in last 24 hours: ?'@BP'$  (!) 136/59   Pulse 60   Temp 98.9 ?F (37.2 ?C)   Ht '6\' 4"'$  (1.93 m)   Wt 261 lb (118.4 kg)   SpO2 98%   BMI 31.77 kg/m?  ?GEN: NAD ?EYE: Sclerae anicteric ?ENT: MMM ?CV: Non-tachycardic ?Pulm: CTA b/l ?GI: Soft, NT/ND ?NEURO:  Alert & Oriented x 3 ? ? ?Gerrit Heck, DO ?[facility] ? ? ?05/10/2021 2:29 PM ? ?

## 2021-05-10 NOTE — Progress Notes (Signed)
Called to room to assist during endoscopic procedure.  Patient ID and intended procedure confirmed with present staff. Received instructions for my participation in the procedure from the performing physician.  

## 2021-05-12 ENCOUNTER — Telehealth: Payer: Self-pay | Admitting: *Deleted

## 2021-05-12 ENCOUNTER — Telehealth: Payer: Self-pay

## 2021-05-12 NOTE — Telephone Encounter (Signed)
?  Follow up Call- ? ? ?  05/10/2021  ?  1:56 PM 12/04/2019  ? 10:16 AM  ?Call back number  ?Post procedure Call Back phone  # [phone]  ?Permission to leave phone message Yes Yes  ?  ? ?Patient questions: ? ?Do you have a fever, pain , or abdominal swelling? No. ?Pain Score  0 * ? ?Have you tolerated food without any problems? Yes.   ? ?Have you been able to return to your normal activities? Yes.   ? ?Do you have any questions about your discharge instructions: ?Diet   No. ?Medications  No. ?Follow up visit  No. ? ?Do you have questions or concerns about your Care? No. ? ?Actions: ?* If pain score is 4 or above: ?No action needed, pain <4. ? ? ?

## 2021-05-12 NOTE — Telephone Encounter (Signed)
Left message on f/u call 

## 2021-05-14 ENCOUNTER — Encounter: Payer: Self-pay | Admitting: Gastroenterology

## 2021-05-27 ENCOUNTER — Other Ambulatory Visit: Payer: Self-pay | Admitting: *Deleted

## 2021-05-27 ENCOUNTER — Encounter: Payer: Self-pay | Admitting: *Deleted

## 2021-05-27 ENCOUNTER — Other Ambulatory Visit: Payer: Self-pay

## 2021-05-27 DIAGNOSIS — G609 Hereditary and idiopathic neuropathy, unspecified: Secondary | ICD-10-CM

## 2021-05-27 MED ORDER — GABAPENTIN 400 MG PO CAPS: ORAL_CAPSULE | ORAL | 4 refills | Status: DC

## 2021-05-27 MED ORDER — GABAPENTIN 400 MG PO CAPS: 400.0000 mg | ORAL_CAPSULE | Freq: Four times a day (QID) | ORAL | 4 refills | Status: DC

## 2021-06-10 ENCOUNTER — Inpatient Hospital Stay: Payer: Medicare Other | Attending: Hematology & Oncology

## 2021-06-10 ENCOUNTER — Inpatient Hospital Stay: Payer: Medicare Other

## 2021-06-10 VITALS — BP 140/61 | HR 67 | Temp 97.5°F | Resp 17

## 2021-06-10 DIAGNOSIS — Z95828 Presence of other vascular implants and grafts: Secondary | ICD-10-CM

## 2021-06-10 DIAGNOSIS — Z79899 Other long term (current) drug therapy: Secondary | ICD-10-CM | POA: Insufficient documentation

## 2021-06-10 DIAGNOSIS — D500 Iron deficiency anemia secondary to blood loss (chronic): Secondary | ICD-10-CM

## 2021-06-10 DIAGNOSIS — C184 Malignant neoplasm of transverse colon: Secondary | ICD-10-CM

## 2021-06-10 DIAGNOSIS — C187 Malignant neoplasm of sigmoid colon: Secondary | ICD-10-CM | POA: Insufficient documentation

## 2021-06-10 DIAGNOSIS — K922 Gastrointestinal hemorrhage, unspecified: Secondary | ICD-10-CM

## 2021-06-10 LAB — CEA (IN HOUSE-CHCC): CEA (CHCC-In House): <1 ng/mL (ref 0.00–5.00)

## 2021-06-10 LAB — LACTATE DEHYDROGENASE: LDH: 118 U/L (ref 98–192)

## 2021-06-10 MED ORDER — SODIUM CHLORIDE 0.9% FLUSH
10.0000 mL | INTRAVENOUS | Status: DC | PRN
  Administered 2021-06-10: 10 mL via INTRAVENOUS

## 2021-06-10 MED ORDER — HEPARIN SOD (PORK) LOCK FLUSH 100 UNIT/ML IV SOLN
500.0000 [IU] | Freq: Once | INTRAVENOUS | Status: AC | PRN
  Administered 2021-06-10: 500 [IU]

## 2021-06-10 MED ORDER — SODIUM CHLORIDE 0.9% FLUSH
3.0000 mL | Freq: Once | INTRAVENOUS | Status: AC | PRN
  Administered 2021-06-10: 10 mL

## 2021-06-10 MED ORDER — HEPARIN SOD (PORK) LOCK FLUSH 100 UNIT/ML IV SOLN
500.0000 [IU] | Freq: Once | INTRAVENOUS | Status: AC
  Administered 2021-06-10: 500 [IU] via INTRAVENOUS

## 2021-06-10 NOTE — Patient Instructions (Signed)

## 2021-08-02 ENCOUNTER — Other Ambulatory Visit: Payer: Self-pay | Admitting: Pharmacist

## 2021-08-13 ENCOUNTER — Other Ambulatory Visit: Payer: Self-pay

## 2021-08-13 DIAGNOSIS — C184 Malignant neoplasm of transverse colon: Secondary | ICD-10-CM

## 2021-08-16 ENCOUNTER — Ambulatory Visit (HOSPITAL_BASED_OUTPATIENT_CLINIC_OR_DEPARTMENT_OTHER)
Admission: RE | Admit: 2021-08-16 | Discharge: 2021-08-16 | Disposition: A | Discharge status: Home / Self Care | Payer: Medicare Other | Source: Ambulatory Visit | Attending: Hematology & Oncology | Admitting: Hematology & Oncology

## 2021-08-16 ENCOUNTER — Other Ambulatory Visit: Payer: Self-pay

## 2021-08-16 ENCOUNTER — Encounter: Payer: Self-pay | Admitting: Hematology & Oncology

## 2021-08-16 ENCOUNTER — Inpatient Hospital Stay: Payer: Medicare Other

## 2021-08-16 ENCOUNTER — Inpatient Hospital Stay (HOSPITAL_BASED_OUTPATIENT_CLINIC_OR_DEPARTMENT_OTHER): Payer: Medicare Other | Admitting: Hematology & Oncology

## 2021-08-16 ENCOUNTER — Inpatient Hospital Stay: Payer: Medicare Other | Attending: Hematology & Oncology

## 2021-08-16 VITALS — BP 145/78 | HR 74 | Temp 98.4°F | Resp 18 | Ht 76.0 in | Wt 261.0 lb

## 2021-08-16 DIAGNOSIS — Z95828 Presence of other vascular implants and grafts: Secondary | ICD-10-CM

## 2021-08-16 DIAGNOSIS — Z79899 Other long term (current) drug therapy: Secondary | ICD-10-CM | POA: Insufficient documentation

## 2021-08-16 DIAGNOSIS — G629 Polyneuropathy, unspecified: Secondary | ICD-10-CM | POA: Insufficient documentation

## 2021-08-16 DIAGNOSIS — C184 Malignant neoplasm of transverse colon: Secondary | ICD-10-CM

## 2021-08-16 DIAGNOSIS — Z91030 Bee allergy status: Secondary | ICD-10-CM | POA: Diagnosis not present

## 2021-08-16 DIAGNOSIS — Z888 Allergy status to other drugs, medicaments and biological substances status: Secondary | ICD-10-CM | POA: Diagnosis not present

## 2021-08-16 DIAGNOSIS — C187 Malignant neoplasm of sigmoid colon: Secondary | ICD-10-CM | POA: Insufficient documentation

## 2021-08-16 DIAGNOSIS — Z881 Allergy status to other antibiotic agents status: Secondary | ICD-10-CM | POA: Insufficient documentation

## 2021-08-16 LAB — CMP (CANCER CENTER ONLY)
ALT: 21 U/L (ref 0–44)
AST: 18 U/L (ref 15–41)
Albumin: 4.3 g/dL (ref 3.5–5.0)
Alkaline Phosphatase: 73 U/L (ref 38–126)
Anion gap: 9 (ref 5–15)
BUN: 19 mg/dL (ref 8–23)
CO2: 25 mmol/L (ref 22–32)
Calcium: 9.2 mg/dL (ref 8.9–10.3)
Chloride: 106 mmol/L (ref 98–111)
Creatinine: 1.05 mg/dL (ref 0.61–1.24)
GFR, Estimated: >60 mL/min (ref >60)
Glucose, Bld: 97 mg/dL (ref 70–99)
Potassium: 4 mmol/L (ref 3.5–5.1)
Sodium: 140 mmol/L (ref 135–145)
Total Bilirubin: 0.8 mg/dL (ref 0.3–1.2)
Total Protein: 6.9 g/dL (ref 6.5–8.1)

## 2021-08-16 LAB — CBC WITH DIFFERENTIAL (CANCER CENTER ONLY)
Abs Immature Granulocytes: 0.05 10*3/uL (ref 0.00–0.07)
Basophils Absolute: 0.1 10*3/uL (ref 0.0–0.1)
Basophils Relative: 1 %
Eosinophils Absolute: 0.1 10*3/uL (ref 0.0–0.5)
Eosinophils Relative: 1 %
HCT: 40.5 % (ref 39.0–52.0)
Hemoglobin: 13.5 g/dL (ref 13.0–17.0)
Immature Granulocytes: 1 %
Lymphocytes Relative: 26 %
Lymphs Abs: 2 10*3/uL (ref 0.7–4.0)
MCH: 30.6 pg (ref 26.0–34.0)
MCHC: 33.3 g/dL (ref 30.0–36.0)
MCV: 91.8 fL (ref 80.0–100.0)
Monocytes Absolute: 0.4 10*3/uL (ref 0.1–1.0)
Monocytes Relative: 6 %
Neutro Abs: 5 10*3/uL (ref 1.7–7.7)
Neutrophils Relative %: 65 %
Platelet Count: 193 10*3/uL (ref 150–400)
RBC: 4.41 MIL/uL (ref 4.22–5.81)
RDW: 13.6 % (ref 11.5–15.5)
WBC Count: 7.6 10*3/uL (ref 4.0–10.5)
nRBC: 0 % (ref 0.0–0.2)

## 2021-08-16 LAB — CEA (IN HOUSE-CHCC): CEA (CHCC-In House): <1 ng/mL (ref 0.00–5.00)

## 2021-08-16 LAB — LACTATE DEHYDROGENASE: LDH: 121 U/L (ref 98–192)

## 2021-08-16 MED ORDER — IOHEXOL 300 MG/ML  SOLN
100.0000 mL | Freq: Once | INTRAMUSCULAR | Status: AC | PRN
  Administered 2021-08-16: 100 mL via INTRAVENOUS

## 2021-08-16 MED ORDER — HEPARIN SOD (PORK) LOCK FLUSH 100 UNIT/ML IV SOLN
500.0000 [IU] | Freq: Once | INTRAVENOUS | Status: AC
  Administered 2021-08-16: 500 [IU] via INTRAVENOUS

## 2021-08-16 MED ORDER — SODIUM CHLORIDE 0.9% FLUSH
10.0000 mL | Freq: Once | INTRAVENOUS | Status: AC
  Administered 2021-08-16: 10 mL via INTRAVENOUS

## 2021-08-16 NOTE — Patient Instructions (Signed)

## 2021-08-16 NOTE — Addendum Note (Signed)
Addended by: Shelda Altes on: 08/16/2021 09:58 AM   Modules accepted: Orders

## 2021-08-16 NOTE — Progress Notes (Addendum)
Hematology and Oncology Follow Up Visit  John [name] [id number] 11-05-1955 66 y.o. 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  He is looking quite good.  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  I hate that he is having a tough time with the neuropathy.  Thankfully, he still able to do his woodworking.  He is working on a table right now.  He did have a CT scan done today.  The CT scan did not show any evidence of recurrent disease.  His last CEA level back in February was less than 1.0.  His main problem right now is that he has a abdominal wall hernia down around his umbilicus.  This is where he had his surgery.  He says it is getting little bit larger.  It is not causing much pain.  However, he little would like to have this fixed.  He will have to go back to see Dr. Dema Severin.  He has had no problems with bleeding.  There is no nausea or vomiting.  He has had no cough or shortness of breath.  He has had no rashes.  There is been no leg swelling.  Overall, I would say his performance status is probably ECOG 0.     Medications:  Current Outpatient Medications:    b complex vitamins capsule, Take 1 capsule by mouth daily., Disp: , Rfl:    cetirizine (ZYRTEC) 10 MG chewable tablet, Chew 10 mg by mouth daily as needed., Disp: , Rfl:    gabapentin (NEURONTIN) 400 MG capsule, Take 1 capsule (400 mg total) by mouth 4 (four) times daily., Disp: 120 capsule, Rfl: 4   pantoprazole (PROTONIX) 40 MG tablet, Take 1 tablet (40 mg total) by mouth daily., Disp: 90 tablet, Rfl: 4   telmisartan (MICARDIS) 40 MG tablet, Take 40 mg by mouth daily., Disp: , Rfl:    VITAMIN D, CHOLECALCIFEROL, PO, Take 1,000 Int'l Units/day by mouth daily., Disp: , Rfl:    Allergies:  Allergies  Allergen Reactions   Other Swelling    Bee Sting  Bee Sting    Erythromycin     Upset stomach    Lisinopril Cough    Past Medical History, Surgical history, Social history, and Family History were reviewed and updated.  Review of Systems: Review of Systems  Constitutional: Negative.   HENT:  Negative.  Negative for voice change.   Eyes: Negative.   Respiratory: Negative.    Cardiovascular: Negative.   Gastrointestinal: Negative.   Endocrine: Negative.   Genitourinary: Negative.    Musculoskeletal: Negative.   Skin: Negative.   Neurological: Negative.   Hematological: Negative.   Psychiatric/Behavioral: Negative.      Physical Exam:  vitals were not taken for this visit.   Wt Readings from Last 3 Encounters:  05/10/21 261 lb (118.4 kg)  04/22/21 261 lb (118.4 kg)  04/12/21 259 lb (117.5 kg)    Physical Exam Vitals reviewed.  HENT:     Head: Normocephalic and atraumatic.  Eyes:     Pupils: Pupils are equal, round, and reactive to light.  Cardiovascular:     Rate and Rhythm: Normal rate and regular rhythm.     Heart sounds: Normal heart sounds.  Pulmonary:     Effort: Pulmonary  effort is normal.     Breath sounds: Normal breath sounds.  Abdominal:     General: Bowel sounds are normal.     Palpations: Abdomen is soft.     Comments: Abdominal exam shows well-healed laparotomy scars.  He does have this anterior abdominal hernia just to the left of the umbilicus.  It is somewhat prominent.  It is not that reducible.    Musculoskeletal:        General: No tenderness or deformity. Normal range of motion.     Cervical back: Normal range of motion.  Lymphadenopathy:     Cervical: No cervical adenopathy.  Skin:    General: Skin is warm and dry.     Findings: No erythema or rash.  Neurological:     Mental Status: He is alert and oriented to person, place, and time.  Psychiatric:        Behavior: Behavior normal.        Thought Content:  Thought content normal.        Judgment: Judgment normal.      Lab Results  Component Value Date   WBC 7.6 08/16/2021   HGB 13.5 08/16/2021   HCT 40.5 08/16/2021   MCV 91.8 08/16/2021   PLT 193 08/16/2021     Chemistry      Component Value Date/Time   NA 140 08/16/2021 0800   K 4.0 08/16/2021 0800   CL 106 08/16/2021 0800   CO2 25 08/16/2021 0800   BUN 19 08/16/2021 0800   CREATININE 1.05 08/16/2021 0800   CREATININE 1.14 12/04/2019 1310      Component Value Date/Time   CALCIUM 9.2 08/16/2021 0800   ALKPHOS 73 08/16/2021 0800   AST 18 08/16/2021 0800   ALT 21 08/16/2021 0800   BILITOT 0.8 08/16/2021 0800      Impression and Plan: John [name] is a a very nice 66 year old white male.  He has stage IIIb sigmoid colon cancer. He completed all this adjuvant chemotherapy.  This was back in May 2022.  Everything looks fantastic.  CAT scan looks great.  I am not surprised.  I just wish that his neuropathy would get a little bit better.  I will plan for another CT scan in the late Fall.  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  I must say that John [name] is incredibly [place].    Volanda Napoleon, MD 7/3/20239:37 AM

## 2021-08-18 ENCOUNTER — Encounter: Payer: Self-pay | Admitting: *Deleted

## 2021-09-22 ENCOUNTER — Encounter: Payer: Self-pay | Admitting: *Deleted

## 2021-09-23 ENCOUNTER — Encounter: Payer: Self-pay | Admitting: *Deleted

## 2021-09-23 DIAGNOSIS — G609 Hereditary and idiopathic neuropathy, unspecified: Secondary | ICD-10-CM

## 2021-09-23 DIAGNOSIS — C184 Malignant neoplasm of transverse colon: Secondary | ICD-10-CM

## 2021-09-23 NOTE — Progress Notes (Signed)
See MyChart message dated 09/22/21. Referral placed for Neurology per Dr Marin Olp.   Oncology Nurse Navigator Documentation     09/23/2021    9:00 AM  Oncology Nurse Navigator Flowsheets  Navigator Location [facility]  Navigator Encounter Type MyChart  Patient Visit Type MedOnc  Treatment Phase Post-Tx Follow-up  Barriers/Navigation Needs No Barriers At This Time  Interventions Referrals  Acuity Level 1-No Barriers  Referrals Other  Education Method Written  Support Groups/Services Friends and Family  Time Spent with Patient 15

## 2021-10-11 ENCOUNTER — Other Ambulatory Visit (HOSPITAL_BASED_OUTPATIENT_CLINIC_OR_DEPARTMENT_OTHER): Payer: Medicare Other

## 2021-10-11 ENCOUNTER — Inpatient Hospital Stay: Payer: Medicare Other | Attending: Hematology & Oncology

## 2021-10-11 VITALS — BP 151/78 | HR 78 | Temp 98.4°F | Resp 18

## 2021-10-11 DIAGNOSIS — Z95828 Presence of other vascular implants and grafts: Secondary | ICD-10-CM

## 2021-10-11 DIAGNOSIS — C187 Malignant neoplasm of sigmoid colon: Secondary | ICD-10-CM | POA: Diagnosis present

## 2021-10-11 DIAGNOSIS — Z452 Encounter for adjustment and management of vascular access device: Secondary | ICD-10-CM | POA: Insufficient documentation

## 2021-10-11 DIAGNOSIS — C184 Malignant neoplasm of transverse colon: Secondary | ICD-10-CM

## 2021-10-11 MED ORDER — SODIUM CHLORIDE 0.9% FLUSH
10.0000 mL | Freq: Once | INTRAVENOUS | Status: AC
  Administered 2021-10-11: 10 mL via INTRAVENOUS

## 2021-10-11 MED ORDER — HEPARIN SOD (PORK) LOCK FLUSH 100 UNIT/ML IV SOLN
500.0000 [IU] | Freq: Once | INTRAVENOUS | Status: AC
  Administered 2021-10-11: 500 [IU] via INTRAVENOUS

## 2021-10-11 NOTE — Patient Instructions (Signed)

## 2021-11-29 ENCOUNTER — Ambulatory Visit: Payer: Medicare Other | Admitting: Neurology

## 2021-12-06 ENCOUNTER — Inpatient Hospital Stay: Payer: Medicare Other | Attending: Hematology & Oncology

## 2021-12-06 ENCOUNTER — Telehealth: Payer: Self-pay | Admitting: *Deleted

## 2021-12-06 DIAGNOSIS — C187 Malignant neoplasm of sigmoid colon: Secondary | ICD-10-CM | POA: Insufficient documentation

## 2021-12-06 DIAGNOSIS — Z452 Encounter for adjustment and management of vascular access device: Secondary | ICD-10-CM | POA: Diagnosis not present

## 2021-12-06 DIAGNOSIS — Z95828 Presence of other vascular implants and grafts: Secondary | ICD-10-CM

## 2021-12-06 MED ORDER — HEPARIN SOD (PORK) LOCK FLUSH 100 UNIT/ML IV SOLN
500.0000 [IU] | Freq: Once | INTRAVENOUS | Status: AC
  Administered 2021-12-06: 500 [IU] via INTRAVENOUS

## 2021-12-06 MED ORDER — SODIUM CHLORIDE 0.9% FLUSH
10.0000 mL | Freq: Once | INTRAVENOUS | Status: AC
  Administered 2021-12-06: 10 mL via INTRAVENOUS

## 2021-12-06 NOTE — Telephone Encounter (Signed)
Patient called to let us know that he went down to imaging in our building to pick up his CT scan contrast for his upcoming scan.  Per new policies they were not able to get it.  Patient was not happy about this change and stated that he would be having his scan done elsewhere.  Told patient this is fine but to let us know where he is intending on getting it as orders would need to be changed.

## 2021-12-07 ENCOUNTER — Encounter: Payer: Self-pay | Admitting: *Deleted

## 2021-12-09 ENCOUNTER — Encounter: Payer: Self-pay | Admitting: *Deleted

## 2021-12-09 NOTE — Progress Notes (Signed)
See MyChart Communication dated 12/07/21.  Oncology Nurse Navigator Documentation     12/09/2021    1:00 PM  Oncology Nurse Navigator Flowsheets  Navigator Location [facility]  Navigator Encounter Type MyChart;Appt/Treatment Plan Review  Patient Visit Type MedOnc  Treatment Phase Post-Tx Follow-up  Barriers/Navigation Needs No Barriers At This Time  Education Other  Interventions Coordination of Care;Education  Acuity Level 1-No Barriers  Coordination of Care Appts;Radiology  Education Method Written  Support Groups/Services Friends and Family  Time Spent with Patient 30

## 2022-01-10 ENCOUNTER — Ambulatory Visit: Payer: Medicare Other | Admitting: Hematology & Oncology

## 2022-01-10 ENCOUNTER — Other Ambulatory Visit: Payer: Medicare Other

## 2022-01-10 ENCOUNTER — Other Ambulatory Visit (HOSPITAL_BASED_OUTPATIENT_CLINIC_OR_DEPARTMENT_OTHER): Payer: Medicare Other

## 2022-01-21 ENCOUNTER — Ambulatory Visit (HOSPITAL_COMMUNITY)
Admission: RE | Admit: 2022-01-21 | Discharge: 2022-01-21 | Disposition: A | Discharge status: Home / Self Care | Payer: Medicare Other | Source: Ambulatory Visit | Attending: Hematology & Oncology | Admitting: Hematology & Oncology

## 2022-01-21 DIAGNOSIS — C184 Malignant neoplasm of transverse colon: Secondary | ICD-10-CM

## 2022-01-21 LAB — POCT I-STAT CREATININE: Creatinine, Ser: 1.1 mg/dL (ref 0.61–1.24)

## 2022-01-21 MED ORDER — IOHEXOL 300 MG/ML  SOLN
100.0000 mL | Freq: Once | INTRAMUSCULAR | Status: AC | PRN
  Administered 2022-01-21: 100 mL via INTRAVENOUS

## 2022-01-26 ENCOUNTER — Other Ambulatory Visit: Payer: Self-pay

## 2022-01-26 ENCOUNTER — Inpatient Hospital Stay: Payer: Medicare Other

## 2022-01-26 ENCOUNTER — Inpatient Hospital Stay (HOSPITAL_BASED_OUTPATIENT_CLINIC_OR_DEPARTMENT_OTHER): Payer: Medicare Other | Admitting: Hematology & Oncology

## 2022-01-26 ENCOUNTER — Encounter: Payer: Self-pay | Admitting: Hematology & Oncology

## 2022-01-26 ENCOUNTER — Inpatient Hospital Stay: Payer: Medicare Other | Attending: Hematology & Oncology

## 2022-01-26 VITALS — BP 148/70 | HR 60 | Temp 97.6°F | Resp 18 | Ht 76.0 in | Wt 266.0 lb

## 2022-01-26 DIAGNOSIS — C184 Malignant neoplasm of transverse colon: Secondary | ICD-10-CM

## 2022-01-26 DIAGNOSIS — Z91030 Bee allergy status: Secondary | ICD-10-CM | POA: Insufficient documentation

## 2022-01-26 DIAGNOSIS — C189 Malignant neoplasm of colon, unspecified: Secondary | ICD-10-CM | POA: Diagnosis not present

## 2022-01-26 DIAGNOSIS — G629 Polyneuropathy, unspecified: Secondary | ICD-10-CM | POA: Diagnosis not present

## 2022-01-26 DIAGNOSIS — C187 Malignant neoplasm of sigmoid colon: Secondary | ICD-10-CM | POA: Insufficient documentation

## 2022-01-26 DIAGNOSIS — Z95828 Presence of other vascular implants and grafts: Secondary | ICD-10-CM

## 2022-01-26 DIAGNOSIS — Z79899 Other long term (current) drug therapy: Secondary | ICD-10-CM | POA: Diagnosis not present

## 2022-01-26 DIAGNOSIS — Z881 Allergy status to other antibiotic agents status: Secondary | ICD-10-CM | POA: Diagnosis not present

## 2022-01-26 DIAGNOSIS — Z888 Allergy status to other drugs, medicaments and biological substances status: Secondary | ICD-10-CM | POA: Insufficient documentation

## 2022-01-26 LAB — CMP (CANCER CENTER ONLY)
ALT: 40 U/L (ref 0–44)
AST: 26 U/L (ref 15–41)
Albumin: 4.4 g/dL (ref 3.5–5.0)
Alkaline Phosphatase: 74 U/L (ref 38–126)
Anion gap: 10 (ref 5–15)
BUN: 14 mg/dL (ref 8–23)
CO2: 25 mmol/L (ref 22–32)
Calcium: 8.9 mg/dL (ref 8.9–10.3)
Chloride: 104 mmol/L (ref 98–111)
Creatinine: 1.05 mg/dL (ref 0.61–1.24)
GFR, Estimated: >60 mL/min (ref >60)
Glucose, Bld: 93 mg/dL (ref 70–99)
Potassium: 3.9 mmol/L (ref 3.5–5.1)
Sodium: 139 mmol/L (ref 135–145)
Total Bilirubin: 0.5 mg/dL (ref 0.3–1.2)
Total Protein: 6.8 g/dL (ref 6.5–8.1)

## 2022-01-26 LAB — CBC WITH DIFFERENTIAL (CANCER CENTER ONLY)
Abs Immature Granulocytes: 0.05 10*3/uL (ref 0.00–0.07)
Basophils Absolute: 0 10*3/uL (ref 0.0–0.1)
Basophils Relative: 0 %
Eosinophils Absolute: 0.1 10*3/uL (ref 0.0–0.5)
Eosinophils Relative: 2 %
HCT: 41.2 % (ref 39.0–52.0)
Hemoglobin: 13.7 g/dL (ref 13.0–17.0)
Immature Granulocytes: 1 %
Lymphocytes Relative: 29 %
Lymphs Abs: 1.8 10*3/uL (ref 0.7–4.0)
MCH: 31 pg (ref 26.0–34.0)
MCHC: 33.3 g/dL (ref 30.0–36.0)
MCV: 93.2 fL (ref 80.0–100.0)
Monocytes Absolute: 0.5 10*3/uL (ref 0.1–1.0)
Monocytes Relative: 8 %
Neutro Abs: 3.7 10*3/uL (ref 1.7–7.7)
Neutrophils Relative %: 60 %
Platelet Count: 179 10*3/uL (ref 150–400)
RBC: 4.42 MIL/uL (ref 4.22–5.81)
RDW: 13.8 % (ref 11.5–15.5)
WBC Count: 6.2 10*3/uL (ref 4.0–10.5)
nRBC: 0 % (ref 0.0–0.2)

## 2022-01-26 LAB — CEA (IN HOUSE-CHCC): CEA (CHCC-In House): <1 ng/mL (ref 0.00–5.00)

## 2022-01-26 MED ORDER — HEPARIN SOD (PORK) LOCK FLUSH 100 UNIT/ML IV SOLN
500.0000 [IU] | Freq: Once | INTRAVENOUS | Status: AC
  Administered 2022-01-26: 500 [IU] via INTRAVENOUS

## 2022-01-26 MED ORDER — SODIUM CHLORIDE 0.9% FLUSH
10.0000 mL | INTRAVENOUS | Status: DC | PRN
  Administered 2022-01-26: 10 mL via INTRAVENOUS

## 2022-01-26 NOTE — Progress Notes (Signed)
Hematology and Oncology Follow Up Visit  John [name] [id number] Apr 20, 1955 66 y.o. 01/26/2022   Principle Diagnosis:  Stage IIIb (T3N1M0) moderately differentiated adenocarcinoma of the sigmoid colon  Current Therapy:   Status post resection on 01/28/2020 Adjuvant chemotherapy with FOLFOX-s/p cycle #8 --start on 03/12/2020     Interim History:  John [name] is back for follow-up.  He is looking quite good.  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  He is on pregabalin for this.  He says it does seem to work pretty good  He is still busy doing his woodworking.  He is quite talented with respect to his projects that he build.  He brought in some of his homemade hot sauce.  This is incredibly hot.  We have to use a eyedropper to make sure we do not get too much.  We did go and do a CT scan on him today.  This did not show any evidence of recurrent colon cancer.  We did do a CEA on the last time that we saw him.  This was less than 1.  He and his wife are not planning on going out to the [facility] next year.  They are probably take a month or so to travel down the [place] from [place] to [place].  He would like to have his Port-A-Cath taken out.  I do not see a problem with this happening.  He has had no change in bowel or bladder habits.  He does have an anterior abdominal wall hernia.  He is going to try to hold off and have this fixed.  It really does not bother him all that much.  He has had no cough or shortness of breath.  Is been no leg swelling.  He has had no rashes.  Overall, I would say his performance status is probably ECOG 0.   Medications:  Current Outpatient Medications:    cyanocobalamin (VITAMIN B12) 1000 MCG/ML injection, Inject 1,000 mcg into the muscle every 30 (thirty) days., Disp: , Rfl:    hydrOXYzine (ATARAX) 10 MG tablet, Take 10 mg by mouth at bedtime., Disp: , Rfl:    melatonin 5 MG TABS, Take 5 mg by mouth at bedtime.,  Disp: , Rfl:    pregabalin (LYRICA) 75 MG capsule, Take 75 mg by mouth 2 (two) times daily., Disp: , Rfl:    b complex vitamins capsule, Take 1 capsule by mouth daily., Disp: , Rfl:    pantoprazole (PROTONIX) 40 MG tablet, Take 1 tablet (40 mg total) by mouth daily., Disp: 90 tablet, Rfl: 4   telmisartan (MICARDIS) 40 MG tablet, Take 40 mg by mouth daily., Disp: , Rfl:    VITAMIN D, CHOLECALCIFEROL, PO, Take 1,000 Int'l Units/day by mouth daily., Disp: , Rfl:   Allergies:  Allergies  Allergen Reactions   Other Swelling    Bee Sting     Erythromycin Nausea Only   Lisinopril Cough    Past Medical History, Surgical history, Social history, and Family History were reviewed and updated.  Review of Systems: Review of Systems  Constitutional: Negative.   HENT:  Negative.  Negative for voice change.   Eyes: Negative.   Respiratory: Negative.    Cardiovascular: Negative.   Gastrointestinal: Negative.   Endocrine: Negative.   Genitourinary: Negative.    Musculoskeletal: Negative.   Skin: Negative.   Neurological: Negative.   Hematological: Negative.   Psychiatric/Behavioral: Negative.      Physical Exam:  height is  $'6\' 4"'C$  (1.93 m) and weight is 266 lb (120.7 kg). His oral temperature is 97.6 F (36.4 C). His blood pressure is 148/70 (abnormal) and his pulse is 60. His respiration is 18 and oxygen saturation is 100%.   Wt Readings from Last 3 Encounters:  01/26/22 266 lb (120.7 kg)  08/16/21 261 lb (118.4 kg)  05/10/21 261 lb (118.4 kg)    Physical Exam Vitals reviewed.  HENT:     Head: Normocephalic and atraumatic.  Eyes:     Pupils: Pupils are equal, round, and reactive to light.  Cardiovascular:     Rate and Rhythm: Normal rate and regular rhythm.     Heart sounds: Normal heart sounds.  Pulmonary:     Effort: Pulmonary effort is normal.     Breath sounds: Normal breath sounds.  Abdominal:     General: Bowel sounds are normal.     Palpations: Abdomen is soft.      Comments: Abdominal exam shows well-healed laparotomy scars.  He does have this anterior abdominal hernia just to the left of the umbilicus.  It is somewhat prominent.  It is not that reducible.    Musculoskeletal:        General: No tenderness or deformity. Normal range of motion.     Cervical back: Normal range of motion.  Lymphadenopathy:     Cervical: No cervical adenopathy.  Skin:    General: Skin is warm and dry.     Findings: No erythema or rash.  Neurological:     Mental Status: He is alert and oriented to person, place, and time.  Psychiatric:        Behavior: Behavior normal.        Thought Content: Thought content normal.        Judgment: Judgment normal.     Lab Results  Component Value Date   WBC 6.2 01/26/2022   HGB 13.7 01/26/2022   HCT 41.2 01/26/2022   MCV 93.2 01/26/2022   PLT 179 01/26/2022     Chemistry      Component Value Date/Time   NA 139 01/26/2022 0940   K 3.9 01/26/2022 0940   CL 104 01/26/2022 0940   CO2 25 01/26/2022 0940   BUN 14 01/26/2022 0940   CREATININE 1.05 01/26/2022 0940   CREATININE 1.14 12/04/2019 1310      Component Value Date/Time   CALCIUM 8.9 01/26/2022 0940   ALKPHOS 74 01/26/2022 0940   AST 26 01/26/2022 0940   ALT 40 01/26/2022 0940   BILITOT 0.5 01/26/2022 0940      Impression and Plan: John [name] is a very nice 66 year old white male.  He has stage IIIb sigmoid colon cancer. He completed all his adjuvant chemotherapy.  This was back in May 2022.  We will do another CT scan in 6 months.  I think if that CT scan looks fine, then we can hold off on any further scans unless there is something on his exam or with his lab work.  I just wish that this neuropathy would improve.  I know this has been a real problem for him.  I know that he will have a wonderful Christmas.  We will plan to see him back in 6 months.  Will do the CT scan when we see him back.   Volanda Napoleon, MD 12/13/202310:54 AM

## 2022-01-26 NOTE — Patient Instructions (Signed)

## 2022-02-11 ENCOUNTER — Other Ambulatory Visit: Payer: Self-pay | Admitting: Hematology & Oncology

## 2022-02-11 ENCOUNTER — Ambulatory Visit (HOSPITAL_COMMUNITY)
Admission: RE | Admit: 2022-02-11 | Discharge: 2022-02-11 | Disposition: A | Discharge status: Home / Self Care | Payer: Medicare Other | Source: Ambulatory Visit | Attending: Hematology & Oncology | Admitting: Hematology & Oncology

## 2022-02-11 DIAGNOSIS — C189 Malignant neoplasm of colon, unspecified: Secondary | ICD-10-CM

## 2022-02-11 DIAGNOSIS — Z452 Encounter for adjustment and management of vascular access device: Secondary | ICD-10-CM | POA: Diagnosis present

## 2022-02-11 HISTORY — PX: IR REMOVAL TUN ACCESS W/ PORT W/O FL MOD SED: IMG2290

## 2022-02-11 MED ORDER — LIDOCAINE-EPINEPHRINE 1 %-1:100000 IJ SOLN
INTRAMUSCULAR | Status: AC
  Administered 2022-02-11: 7 mL via INTRADERMAL
  Filled 2022-02-11: qty 1

## 2022-03-28 IMAGING — CT CT ABD-PELV W/ CM
2 of 5 series · 12 of 36 positions shown, 15 images · IV contrast (Omnipaque)
Comparison: None.

CLINICAL DATA: New diagnosis colon cancer, status post remote prior
left nephrectomy for renal cell carcinoma

EXAM:
CT CHEST, ABDOMEN, AND PELVIS WITH CONTRAST
TECHNIQUE: Multidetector CT imaging of the chest, abdomen and pelvis was
performed following the standard protocol during bolus
administration of intravenous contrast.
CONTRAST:  100mL OMNIPAQUE IOHEXOL 300 MG/ML SOLN, additional oral
enteric contrast

[Series 2: cap with 2 · axial · 0.98mm/px · z∈[-726,-126]mm · 9 of 150 slices shown, 12 images]
[im 15/150  mediastinal]
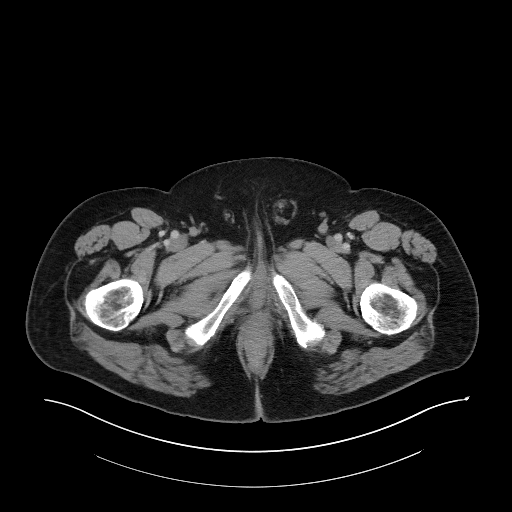
[im 15/150  lung]
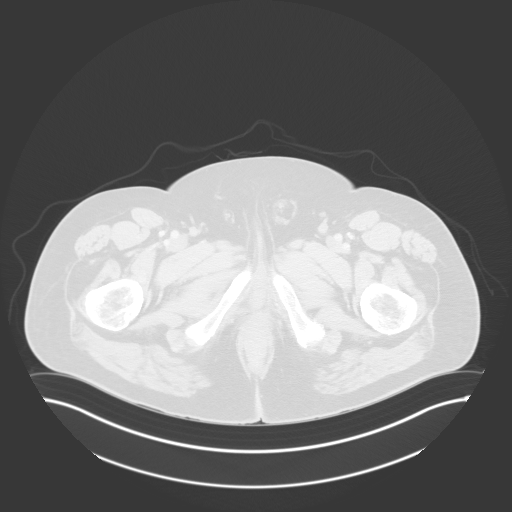
[im 30/150  lung]
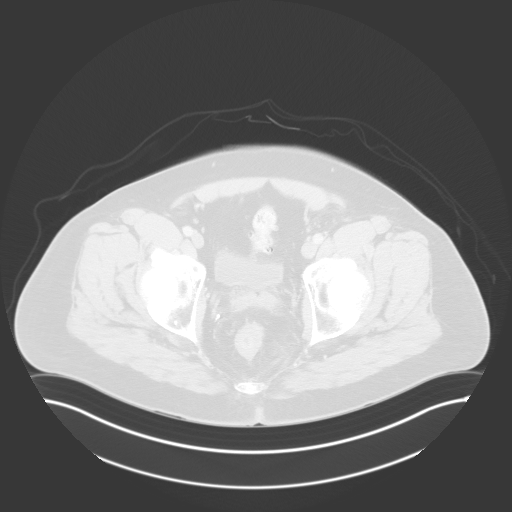
[im 45/150  lung]
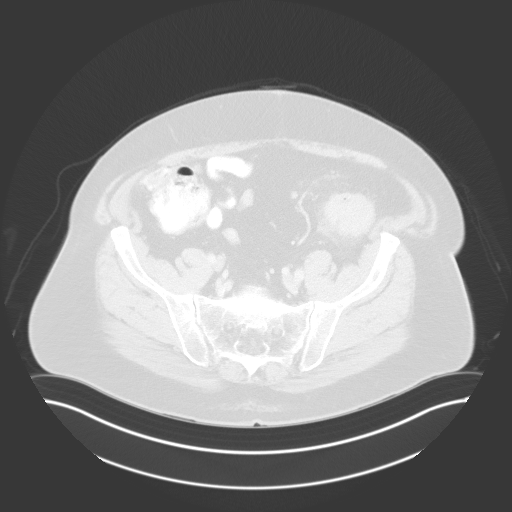
[im 60/150  lung]
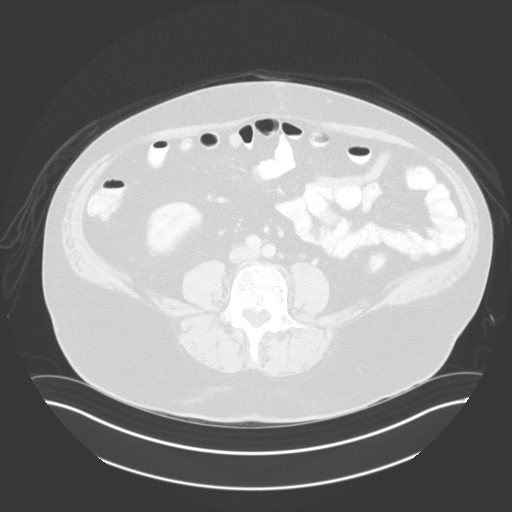
[im 75/150  mediastinal]
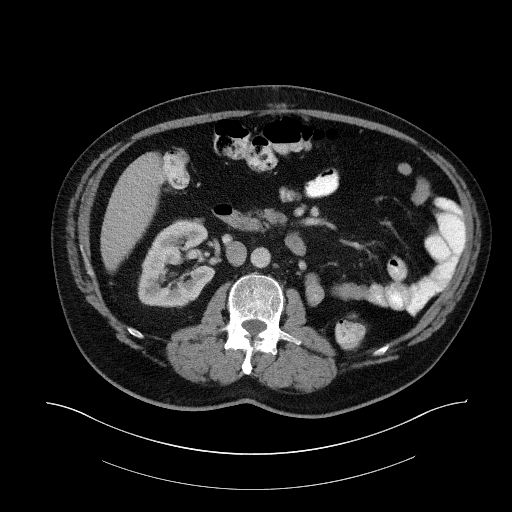
[im 75/150  lung]
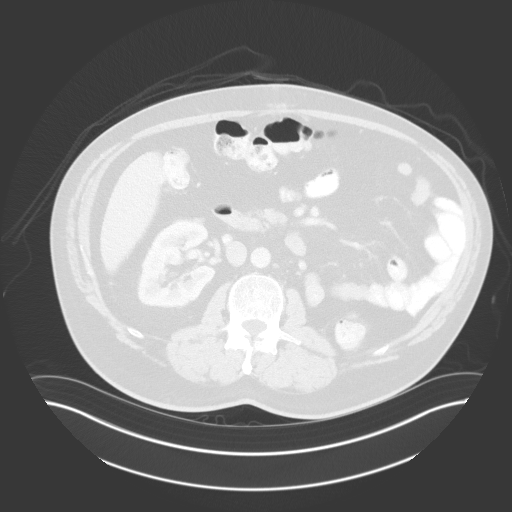
[im 90/150  lung]
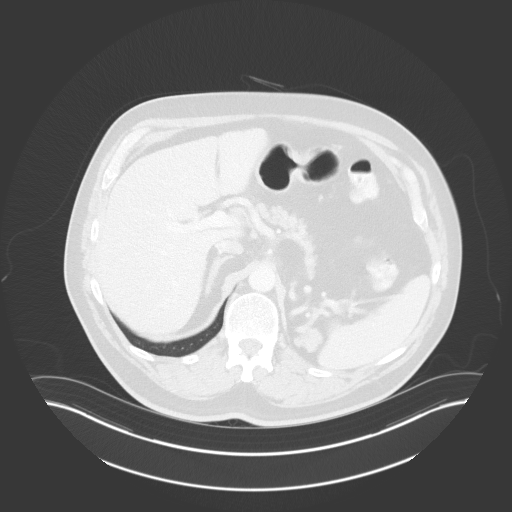
[im 105/150  lung]
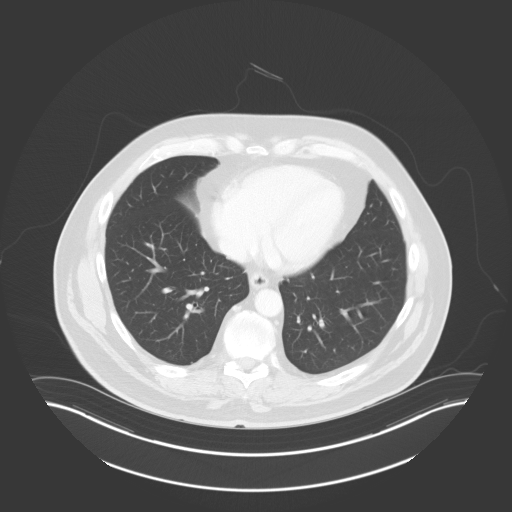
[im 120/150  lung]
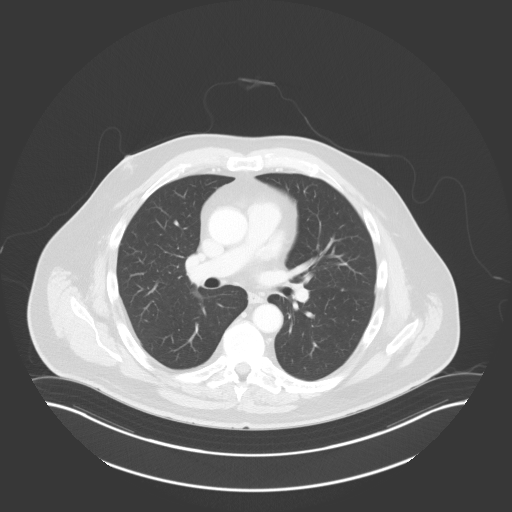
[im 135/150  mediastinal]
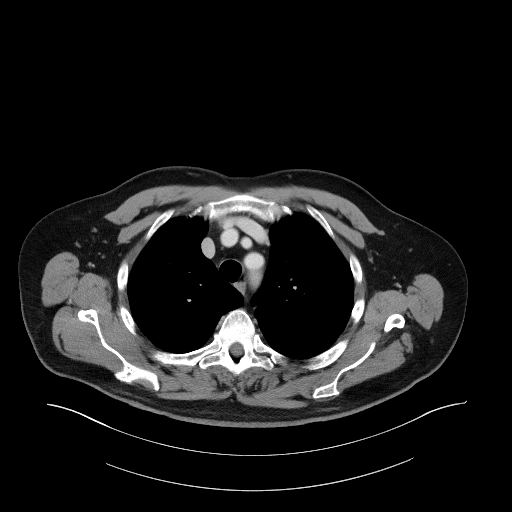
[im 135/150  lung]
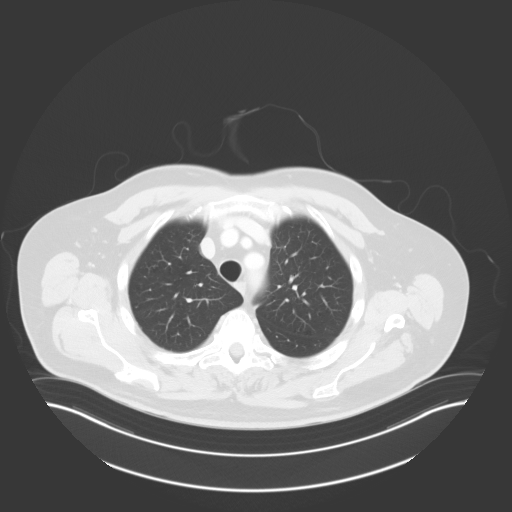

[Series 5: coronals · coronal · 0.98mm/px · 3 of 175 slices shown]
[im 35/175  lung]
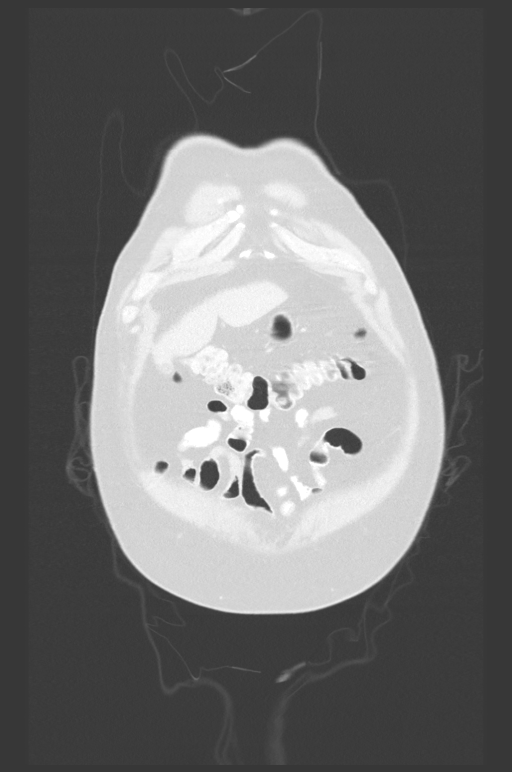
[im 70/175  lung]
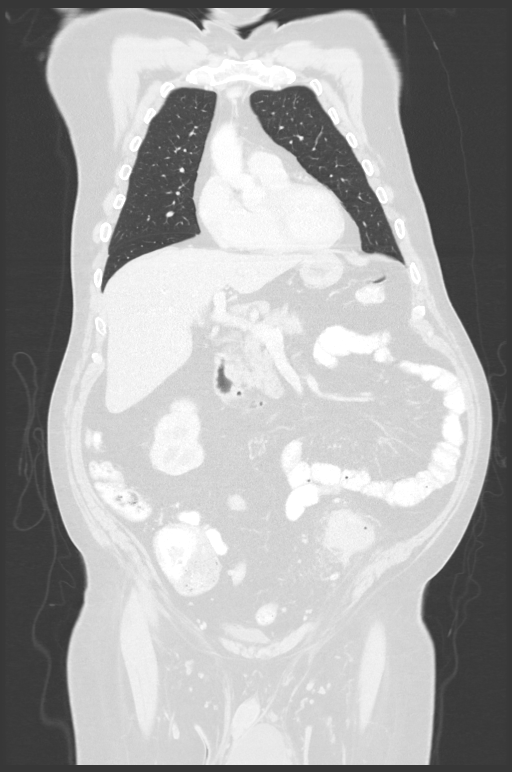
[im 105/175  lung]
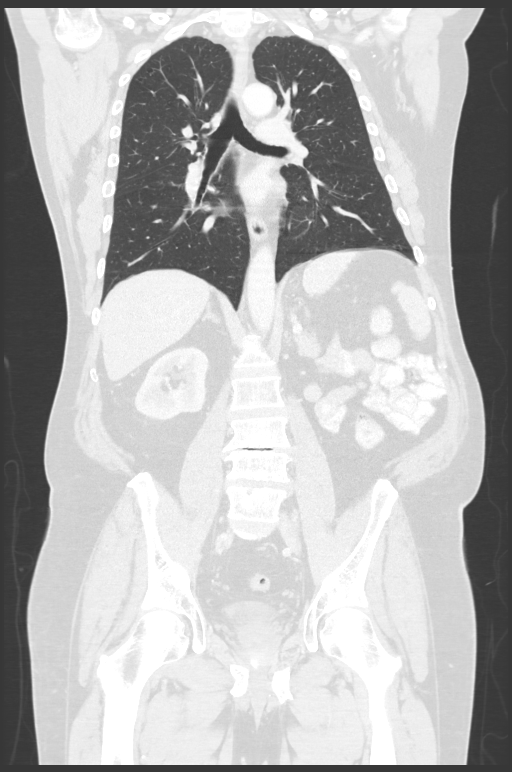

[12 of 36 positions shown; findings below may reference images not displayed]

FINDINGS: CT CHEST FINDINGS

Cardiovascular: Scattered aortic atherosclerosis. Normal heart size.
Left coronary artery calcifications. No pericardial effusion.

Mediastinum/Nodes: No enlarged mediastinal, hilar, or axillary lymph
nodes. Thyroid gland, trachea, and esophagus demonstrate no
significant findings.

Lungs/Pleura: There are multiple tiny centrilobular pulmonary
nodules, concentrated in the upper lobes. Many of these nodules are
benign and calcified. The largest noncalcified nodules are
proximally 3 mm, for example in the left apex (series 4, image 20).
There is a definitively benign 5 mm fissural nodule of the right
lower lobe abutting the major fissure (series 4, image 89) no
pleural effusion or pneumothorax.

Musculoskeletal: No chest wall mass or suspicious bone lesions
identified.

CT ABDOMEN PELVIS FINDINGS

Hepatobiliary: There is a subtle, hypodense lesion of the lateral
liver dome measuring approximately 1.5 x 1.5 cm (series 2, image
53). Small fluid attenuation cyst adjacent to the gallbladder fossa
(series 2, image 72). Status post cholecystectomy. No biliary
dilatation.

Pancreas: Unremarkable. No pancreatic ductal dilatation or
surrounding inflammatory changes.

Spleen: Normal in size without significant abnormality.

Adrenals/Urinary Tract: Adrenal glands are unremarkable. Status post
left nephrectomy. The right kidney is normal, without renal calculi,
solid lesion, or hydronephrosis. Bladder is unremarkable.

Stomach/Bowel: Stomach is within normal limits. Appendix appears
normal. Sigmoid diverticulosis. There is an expansile, partially
circumferential mass of the sigmoid colon, measuring approximately
4.8 x 4.4 x 4.3 cm (series 2, image 105).

Vascular/Lymphatic: Aortic atherosclerosis. There are multiple
prominent subcentimeter lymph nodes of the sigmoid mesocolon
adjacent to mass, measuring no greater than 8 mm (series 2, image
105). There are no other enlarged lymph nodes in the abdomen or
pelvis.

Reproductive: Partially imaged left hydrocele (series 2, image 150).

Other: No abdominal wall hernia or abnormality. No abdominopelvic
ascites.

Musculoskeletal: No acute or significant osseous findings.
IMPRESSION: 1. There is an expansile, partially circumferential mass of the
sigmoid colon, measuring approximately 4.8 x 4.4 x 4.3 cm,
consistent with recently diagnosed primary colon malignancy.
2. There are multiple prominent subcentimeter lymph nodes of the
sigmoid mesocolon adjacent to mass, measuring no greater than 8 mm,
concerning for nodal metastatic disease.
3. There is a subtle, hypodense lesion of the lateral liver dome
measuring approximately 1.5 x 1.5 cm , suspicious for hepatic
metastatic disease although incompletely characterized. Consider
multiphasic contrast enhanced MRI or PET-CT to more completely
characterize.
4. There are multiple tiny centrilobular pulmonary nodules,
concentrated in the upper lobes. Many of these nodules are benign
and calcified. The largest noncalcified nodules are proximally 3 mm,
for example in the left apex. Benign sequelae of prior infection or
inflammation strongly favored given appearance and distribution
although metastatic disease cannot be strictly excluded. Attention
on follow-up.
5. Status post left nephrectomy.  No evidence of local recurrence.
6. Coronary artery disease.  Aortic Atherosclerosis ([id number]).

## 2022-07-28 ENCOUNTER — Ambulatory Visit (HOSPITAL_BASED_OUTPATIENT_CLINIC_OR_DEPARTMENT_OTHER): Payer: Medicare Other

## 2022-07-28 ENCOUNTER — Inpatient Hospital Stay: Payer: Medicare Other

## 2022-07-28 ENCOUNTER — Ambulatory Visit: Payer: Medicare Other | Admitting: Hematology & Oncology

## 2022-08-04 ENCOUNTER — Encounter: Payer: Self-pay | Admitting: Hematology & Oncology

## 2022-08-04 ENCOUNTER — Inpatient Hospital Stay: Payer: Medicare Other | Attending: Hematology & Oncology

## 2022-08-04 ENCOUNTER — Ambulatory Visit (HOSPITAL_BASED_OUTPATIENT_CLINIC_OR_DEPARTMENT_OTHER)
Admission: RE | Admit: 2022-08-04 | Discharge: 2022-08-04 | Disposition: A | Discharge status: Home / Self Care | Payer: Medicare Other | Source: Ambulatory Visit | Attending: Hematology & Oncology | Admitting: Hematology & Oncology

## 2022-08-04 ENCOUNTER — Other Ambulatory Visit: Payer: Self-pay

## 2022-08-04 ENCOUNTER — Inpatient Hospital Stay (HOSPITAL_BASED_OUTPATIENT_CLINIC_OR_DEPARTMENT_OTHER): Payer: Medicare Other | Admitting: Hematology & Oncology

## 2022-08-04 DIAGNOSIS — C189 Malignant neoplasm of colon, unspecified: Secondary | ICD-10-CM | POA: Insufficient documentation

## 2022-08-04 DIAGNOSIS — Z888 Allergy status to other drugs, medicaments and biological substances status: Secondary | ICD-10-CM | POA: Insufficient documentation

## 2022-08-04 DIAGNOSIS — G629 Polyneuropathy, unspecified: Secondary | ICD-10-CM | POA: Insufficient documentation

## 2022-08-04 DIAGNOSIS — Z79899 Other long term (current) drug therapy: Secondary | ICD-10-CM | POA: Diagnosis not present

## 2022-08-04 DIAGNOSIS — C187 Malignant neoplasm of sigmoid colon: Secondary | ICD-10-CM | POA: Insufficient documentation

## 2022-08-04 DIAGNOSIS — Z881 Allergy status to other antibiotic agents status: Secondary | ICD-10-CM | POA: Diagnosis not present

## 2022-08-04 DIAGNOSIS — Z91030 Bee allergy status: Secondary | ICD-10-CM | POA: Diagnosis not present

## 2022-08-04 LAB — CMP (CANCER CENTER ONLY)
ALT: 32 U/L (ref 0–44)
AST: 22 U/L (ref 15–41)
Albumin: 4.4 g/dL (ref 3.5–5.0)
Alkaline Phosphatase: 57 U/L (ref 38–126)
Anion gap: 8 (ref 5–15)
BUN: 17 mg/dL (ref 8–23)
CO2: 25 mmol/L (ref 22–32)
Calcium: 9.6 mg/dL (ref 8.9–10.3)
Chloride: 106 mmol/L (ref 98–111)
Creatinine: 1.02 mg/dL (ref 0.61–1.24)
GFR, Estimated: >60 mL/min (ref >60)
Glucose, Bld: 96 mg/dL (ref 70–99)
Potassium: 4.5 mmol/L (ref 3.5–5.1)
Sodium: 139 mmol/L (ref 135–145)
Total Bilirubin: 0.7 mg/dL (ref 0.3–1.2)
Total Protein: 6.7 g/dL (ref 6.5–8.1)

## 2022-08-04 LAB — CBC WITH DIFFERENTIAL (CANCER CENTER ONLY)
Abs Immature Granulocytes: 0.04 10*3/uL (ref 0.00–0.07)
Basophils Absolute: 0.1 10*3/uL (ref 0.0–0.1)
Basophils Relative: 1 %
Eosinophils Absolute: 0.1 10*3/uL (ref 0.0–0.5)
Eosinophils Relative: 1 %
HCT: 42.4 % (ref 39.0–52.0)
Hemoglobin: 14.5 g/dL (ref 13.0–17.0)
Immature Granulocytes: 1 %
Lymphocytes Relative: 37 %
Lymphs Abs: 2.5 10*3/uL (ref 0.7–4.0)
MCH: 31.7 pg (ref 26.0–34.0)
MCHC: 34.2 g/dL (ref 30.0–36.0)
MCV: 92.6 fL (ref 80.0–100.0)
Monocytes Absolute: 0.4 10*3/uL (ref 0.1–1.0)
Monocytes Relative: 7 %
Neutro Abs: 3.6 10*3/uL (ref 1.7–7.7)
Neutrophils Relative %: 53 %
Platelet Count: 228 10*3/uL (ref 150–400)
RBC: 4.58 MIL/uL (ref 4.22–5.81)
RDW: 13.1 % (ref 11.5–15.5)
WBC Count: 6.6 10*3/uL (ref 4.0–10.5)
nRBC: 0 % (ref 0.0–0.2)

## 2022-08-04 LAB — CEA (IN HOUSE-CHCC): CEA (CHCC-In House): <1 ng/mL (ref 0.00–5.00)

## 2022-08-04 MED ORDER — IOHEXOL 300 MG/ML  SOLN
100.0000 mL | Freq: Once | INTRAMUSCULAR | Status: AC | PRN
  Administered 2022-08-04: 100 mL via INTRAVENOUS
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
## 2022-08-08 ENCOUNTER — Ambulatory Visit (HOSPITAL_BASED_OUTPATIENT_CLINIC_OR_DEPARTMENT_OTHER): Payer: Medicare Other

## 2022-08-19 IMAGING — CT CT CHEST-ABD-PELV W/ CM
2 of 9 series · 8 of 36 positions shown, 14 images · IV contrast (Omnipaque)
Comparison: 12/13/2019 CT chest, abdomen and pelvis. 12/23/2019 MRI
abdomen.

CLINICAL DATA: Stage III sigmoid colon cancer on chemotherapy.
Restaging. Additional history of left nephrectomy for renal cell
carcinoma.

EXAM:
CT CHEST, ABDOMEN, AND PELVIS WITH CONTRAST
TECHNIQUE: Multidetector CT imaging of the chest, abdomen and pelvis was
performed following the standard protocol during bolus
administration of intravenous contrast.
CONTRAST:  100mL OMNIPAQUE IOHEXOL 300 MG/ML  SOLN

[Series 2: cap with 2 · axial · 0.97mm/px · z∈[-756,-136]mm · 7 of 166 slices shown, 12 images]
[im 21/166  mediastinal]
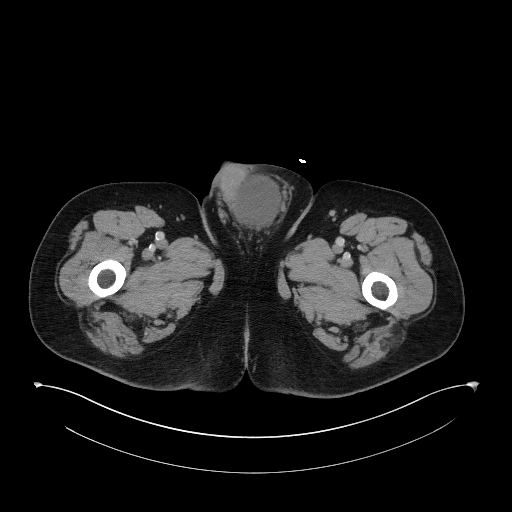
[im 21/166  bone]
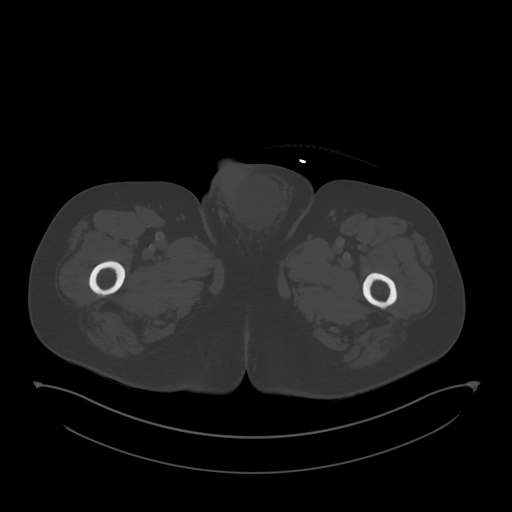
[im 42/166  mediastinal]
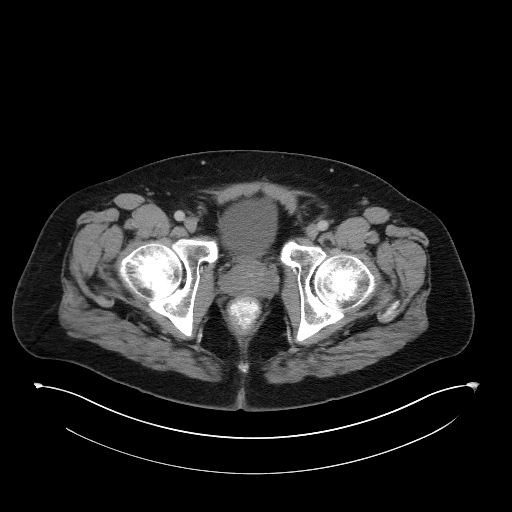
[im 62/166  mediastinal]
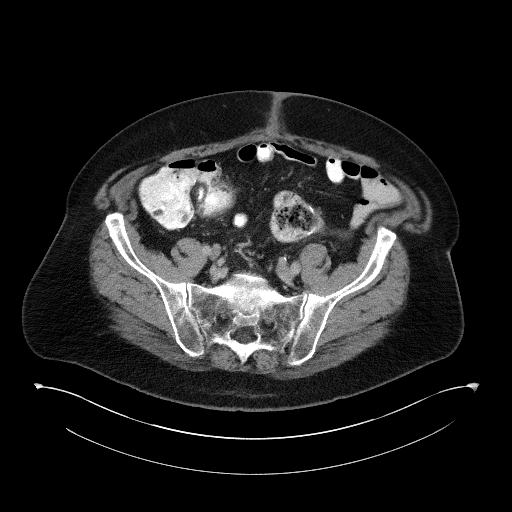
[im 83/166  mediastinal]
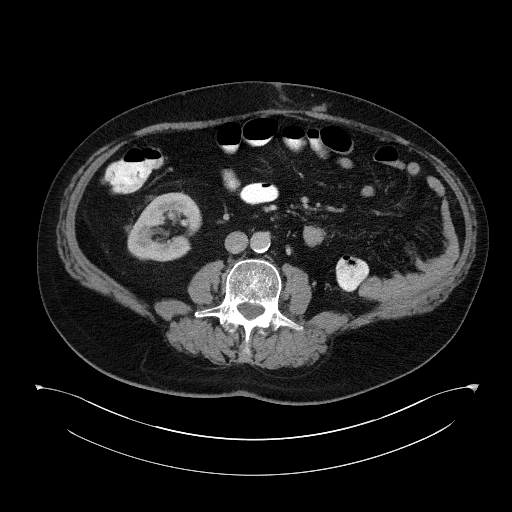
[im 83/166  lung]
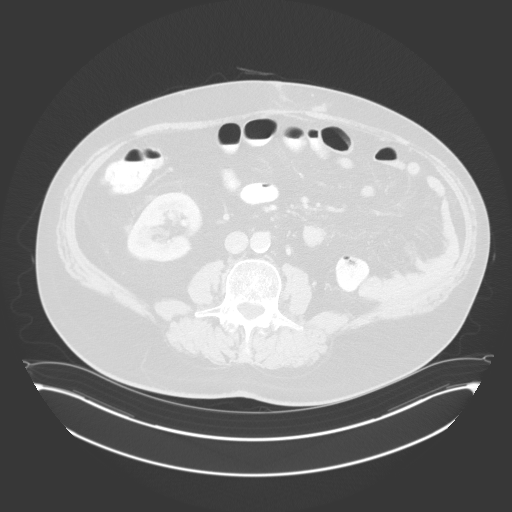
[im 104/166  mediastinal]
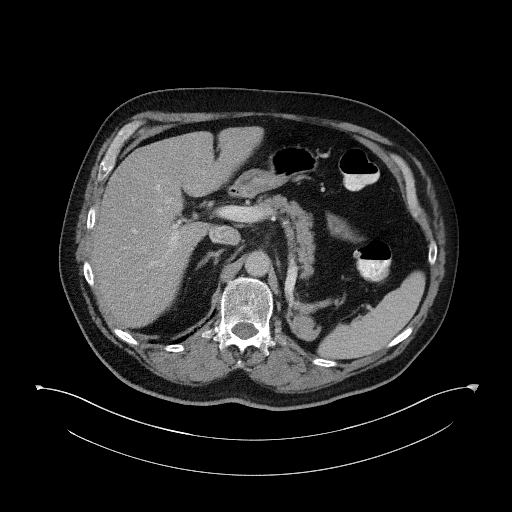
[im 104/166  lung]
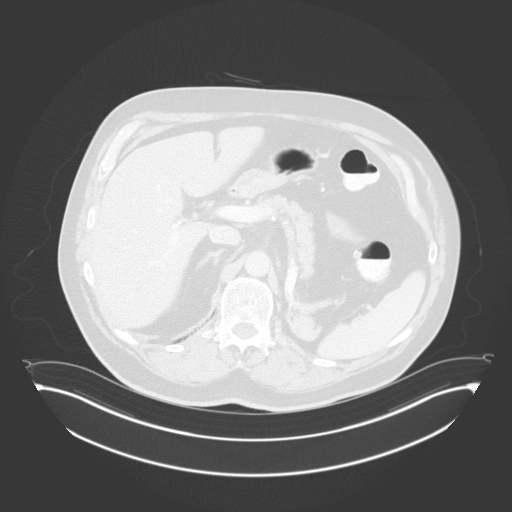
[im 124/166  mediastinal]
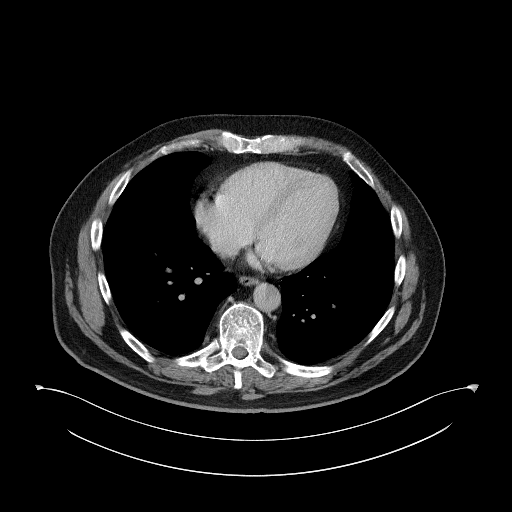
[im 124/166  lung]
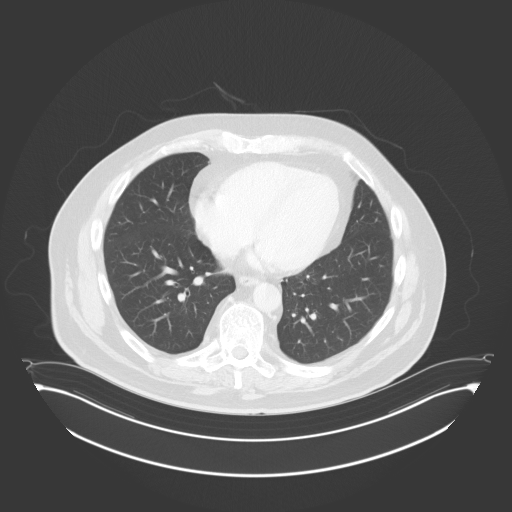
[im 145/166  mediastinal]
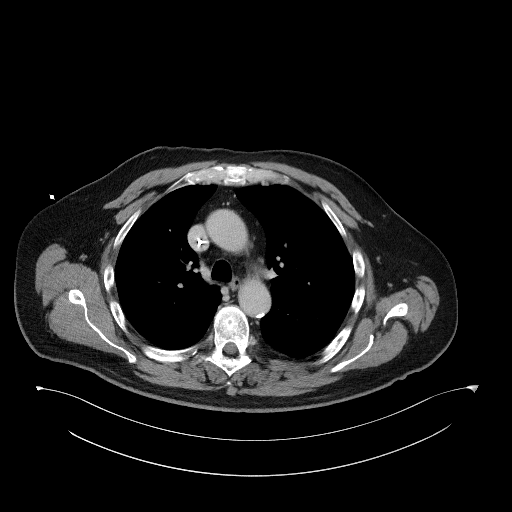
[im 145/166  lung]
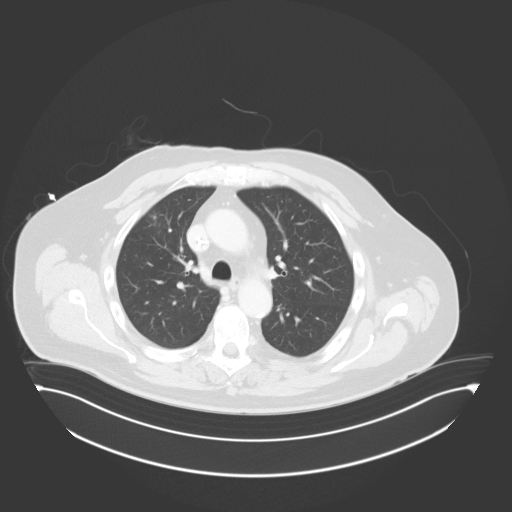

[Series 5: coronals · coronal · 0.91mm/px · 1 of 168 slices shown, 2 images]
[im 84/168  mediastinal]
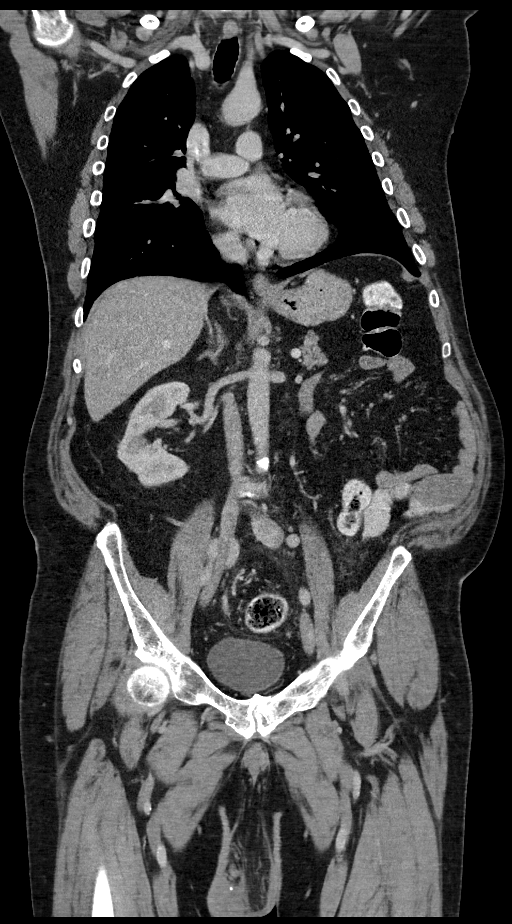
[im 84/168  bone]
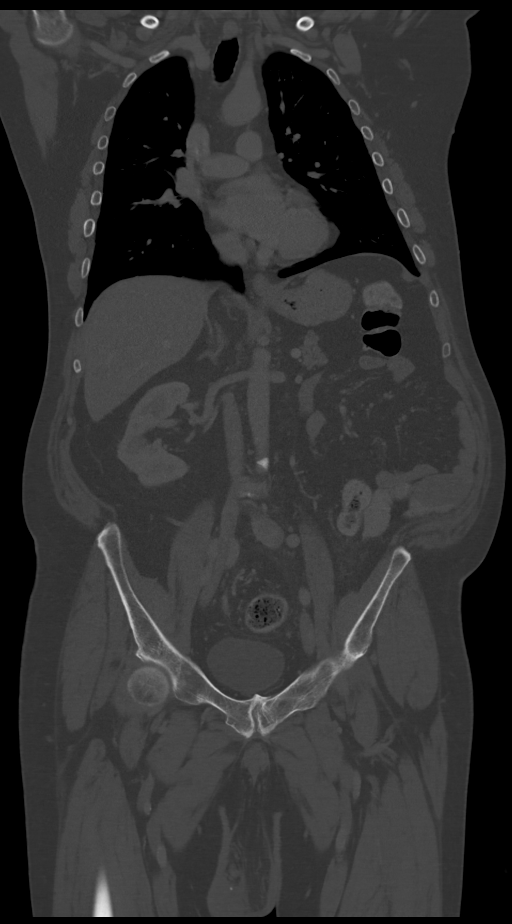

[8 of 36 positions shown; findings below may reference images not displayed]

FINDINGS: CT CHEST FINDINGS

Cardiovascular: Normal heart size. No significant pericardial
effusion/thickening. Left anterior descending coronary
atherosclerosis. Right internal jugular Port-A-Cath terminates at
the cavoatrial junction. Atherosclerotic nonaneurysmal thoracic
aorta. Normal caliber pulmonary arteries. No central pulmonary
emboli.

Mediastinum/Nodes: Left hemithyroidectomy. No discrete right thyroid
nodules. Unremarkable esophagus. No pathologically enlarged
axillary, mediastinal or hilar lymph nodes.

Lungs/Pleura: No pneumothorax. No pleural effusion. No acute
consolidative airspace disease or lung masses. A few scattered tiny
solid pulmonary nodules in both lungs, largest 0.3 cm in the
anterior right lower lobe along the major fissure (series 4/image
88), all stable since 12/13/2019 CT. New minimal patchy ground-glass
opacity in the anterior right upper lobe (series 4/image 56). No new
significant pulmonary nodules.

Musculoskeletal: No aggressive appearing focal osseous lesions. Mild
thoracic spondylosis.

CT ABDOMEN PELVIS FINDINGS

Hepatobiliary: Normal liver size. Simple 1.4 cm inferior right liver
cyst is stable. Hypodense 1.2 cm right liver dome lesion (series
2/image 51), stable and previously characterized as a benign
hemangioma on 12/23/2019 MRI. No new liver masses. Cholecystectomy.
No biliary ductal dilatation.

Pancreas: Normal, with no mass or duct dilation.

Spleen: Normal size. No mass.

Adrenals/Urinary Tract: No discrete adrenal nodules. Status post
left nephrectomy with no recurrent mass in the left nephrectomy bed.
No right hydronephrosis. Stable subcentimeter hypodense anterior
interpolar right renal cortical lesion, stable and characterized as
a simple cyst on prior MRI. No suspicious right renal masses. Normal
bladder.

Stomach/Bowel: Normal non-distended stomach. Normal caliber small
bowel with no small bowel wall thickening. Normal appendix. Oral
contrast transits to the rectum. Interval partial distal colectomy a
with intact appearing distal colonic anastomosis. No large bowel
wall thickening. Mild haziness of the pericolonic fat at the site of
the distal colonic anastomosis, nonspecific, favor postsurgical
change. No pericolonic collections or discrete masses.

Vascular/Lymphatic: Atherosclerotic nonaneurysmal abdominal aorta.
Patent portal, splenic, hepatic and right renal veins. No
pathologically enlarged lymph nodes in the abdomen or pelvis.

Reproductive: Normal size prostate. Large left hydrocele, present
and only partially visualized on the prior study, probably stable.

Other: No pneumoperitoneum, ascites or focal fluid collection.

Musculoskeletal: No aggressive appearing focal osseous lesions.
Moderate lumbar degenerative disc disease.
IMPRESSION: 1. No findings suspicious for metastatic disease in the chest,
abdomen or pelvis. Scattered tiny bilateral pulmonary nodules up to
3 mm are all stable and more likely benign.
2. New minimal patchy ground-glass opacity in the anterior right
upper lobe, nonspecific, favor inflammatory, recommend attention on
future follow-up chest CT.
3. Interval partial distal colectomy with intact distal colonic
anastomosis. Mild haziness of the pericolonic fat at the site of the
distal colonic anastomosis, nonspecific, favor postsurgical change,
recommend attention on future follow-up chest CT.
4. Large left hydrocele, probably stable.
5. Aortic Atherosclerosis ([id number]).

## 2023-01-30 ENCOUNTER — Inpatient Hospital Stay: Payer: Medicare Other | Attending: Hematology & Oncology

## 2023-01-30 ENCOUNTER — Inpatient Hospital Stay (HOSPITAL_BASED_OUTPATIENT_CLINIC_OR_DEPARTMENT_OTHER): Payer: Medicare Other | Admitting: Hematology & Oncology

## 2023-01-30 ENCOUNTER — Other Ambulatory Visit: Payer: Self-pay

## 2023-01-30 VITALS — BP 141/60 | HR 62 | Temp 98.1°F | Resp 18 | Ht 76.0 in | Wt 260.0 lb

## 2023-01-30 DIAGNOSIS — G629 Polyneuropathy, unspecified: Secondary | ICD-10-CM | POA: Diagnosis not present

## 2023-01-30 DIAGNOSIS — C189 Malignant neoplasm of colon, unspecified: Secondary | ICD-10-CM

## 2023-01-30 DIAGNOSIS — Z91030 Bee allergy status: Secondary | ICD-10-CM | POA: Diagnosis not present

## 2023-01-30 DIAGNOSIS — Z881 Allergy status to other antibiotic agents status: Secondary | ICD-10-CM | POA: Insufficient documentation

## 2023-01-30 DIAGNOSIS — Z888 Allergy status to other drugs, medicaments and biological substances status: Secondary | ICD-10-CM | POA: Insufficient documentation

## 2023-01-30 DIAGNOSIS — C187 Malignant neoplasm of sigmoid colon: Secondary | ICD-10-CM | POA: Diagnosis present

## 2023-01-30 DIAGNOSIS — Z79899 Other long term (current) drug therapy: Secondary | ICD-10-CM | POA: Diagnosis not present

## 2023-01-30 DIAGNOSIS — C184 Malignant neoplasm of transverse colon: Secondary | ICD-10-CM | POA: Diagnosis not present

## 2023-01-30 LAB — CBC WITH DIFFERENTIAL (CANCER CENTER ONLY)
Abs Immature Granulocytes: 0.05 10*3/uL (ref 0.00–0.07)
Basophils Absolute: 0 10*3/uL (ref 0.0–0.1)
Basophils Relative: 1 %
Eosinophils Absolute: 0.1 10*3/uL (ref 0.0–0.5)
Eosinophils Relative: 1 %
HCT: 41.3 % (ref 39.0–52.0)
Hemoglobin: 14.1 g/dL (ref 13.0–17.0)
Immature Granulocytes: 1 %
Lymphocytes Relative: 30 %
Lymphs Abs: 1.9 10*3/uL (ref 0.7–4.0)
MCH: 31.9 pg (ref 26.0–34.0)
MCHC: 34.1 g/dL (ref 30.0–36.0)
MCV: 93.4 fL (ref 80.0–100.0)
Monocytes Absolute: 0.5 10*3/uL (ref 0.1–1.0)
Monocytes Relative: 7 %
Neutro Abs: 3.8 10*3/uL (ref 1.7–7.7)
Neutrophils Relative %: 60 %
Platelet Count: 198 10*3/uL (ref 150–400)
RBC: 4.42 MIL/uL (ref 4.22–5.81)
RDW: 13.2 % (ref 11.5–15.5)
WBC Count: 6.3 10*3/uL (ref 4.0–10.5)
nRBC: 0 % (ref 0.0–0.2)

## 2023-01-30 LAB — CMP (CANCER CENTER ONLY)
ALT: 32 U/L (ref 0–44)
AST: 24 U/L (ref 15–41)
Albumin: 4.1 g/dL (ref 3.5–5.0)
Alkaline Phosphatase: 65 U/L (ref 38–126)
Anion gap: 6 (ref 5–15)
BUN: 14 mg/dL (ref 8–23)
CO2: 27 mmol/L (ref 22–32)
Calcium: 9.3 mg/dL (ref 8.9–10.3)
Chloride: 103 mmol/L (ref 98–111)
Creatinine: 1.11 mg/dL (ref 0.61–1.24)
GFR, Estimated: >60 mL/min (ref >60)
Glucose, Bld: 101 mg/dL — ABNORMAL HIGH (ref 70–99)
Potassium: 4.6 mmol/L (ref 3.5–5.1)
Sodium: 136 mmol/L (ref 135–145)
Total Bilirubin: 1 mg/dL (ref <1.2)
Total Protein: 6.8 g/dL (ref 6.5–8.1)

## 2023-01-30 LAB — CEA (IN HOUSE-CHCC): CEA (CHCC-In House): <1 ng/mL (ref 0.00–5.00)
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
## 2023-07-31 ENCOUNTER — Encounter: Payer: Self-pay | Admitting: Medical Oncology

## 2023-07-31 ENCOUNTER — Inpatient Hospital Stay: Payer: Medicare Other | Attending: Hematology & Oncology

## 2023-07-31 ENCOUNTER — Inpatient Hospital Stay (HOSPITAL_BASED_OUTPATIENT_CLINIC_OR_DEPARTMENT_OTHER): Payer: Medicare Other | Admitting: Medical Oncology

## 2023-07-31 VITALS — BP 124/80 | HR 56 | Temp 99.0°F | Resp 18 | Ht 76.0 in | Wt 265.1 lb

## 2023-07-31 DIAGNOSIS — C187 Malignant neoplasm of sigmoid colon: Secondary | ICD-10-CM | POA: Diagnosis present

## 2023-07-31 DIAGNOSIS — Z91030 Bee allergy status: Secondary | ICD-10-CM | POA: Diagnosis not present

## 2023-07-31 DIAGNOSIS — G629 Polyneuropathy, unspecified: Secondary | ICD-10-CM | POA: Insufficient documentation

## 2023-07-31 DIAGNOSIS — Z79899 Other long term (current) drug therapy: Secondary | ICD-10-CM | POA: Insufficient documentation

## 2023-07-31 DIAGNOSIS — C184 Malignant neoplasm of transverse colon: Secondary | ICD-10-CM

## 2023-07-31 DIAGNOSIS — Z881 Allergy status to other antibiotic agents status: Secondary | ICD-10-CM | POA: Diagnosis not present

## 2023-07-31 DIAGNOSIS — Z888 Allergy status to other drugs, medicaments and biological substances status: Secondary | ICD-10-CM | POA: Diagnosis not present

## 2023-07-31 DIAGNOSIS — C189 Malignant neoplasm of colon, unspecified: Secondary | ICD-10-CM

## 2023-07-31 LAB — CBC WITH DIFFERENTIAL (CANCER CENTER ONLY)
Abs Immature Granulocytes: 0.03 10*3/uL (ref 0.00–0.07)
Basophils Absolute: 0.1 10*3/uL (ref 0.0–0.1)
Basophils Relative: 1 %
Eosinophils Absolute: 0.1 10*3/uL (ref 0.0–0.5)
Eosinophils Relative: 1 %
HCT: 40.9 % (ref 39.0–52.0)
Hemoglobin: 13.6 g/dL (ref 13.0–17.0)
Immature Granulocytes: 1 %
Lymphocytes Relative: 28 %
Lymphs Abs: 1.8 10*3/uL (ref 0.7–4.0)
MCH: 31.1 pg (ref 26.0–34.0)
MCHC: 33.3 g/dL (ref 30.0–36.0)
MCV: 93.4 fL (ref 80.0–100.0)
Monocytes Absolute: 0.5 10*3/uL (ref 0.1–1.0)
Monocytes Relative: 8 %
Neutro Abs: 4.1 10*3/uL (ref 1.7–7.7)
Neutrophils Relative %: 61 %
Platelet Count: 226 10*3/uL (ref 150–400)
RBC: 4.38 MIL/uL (ref 4.22–5.81)
RDW: 13.3 % (ref 11.5–15.5)
WBC Count: 6.5 10*3/uL (ref 4.0–10.5)
nRBC: 0 % (ref 0.0–0.2)

## 2023-07-31 LAB — CMP (CANCER CENTER ONLY)
ALT: 30 U/L (ref 0–44)
AST: 22 U/L (ref 15–41)
Albumin: 4.2 g/dL (ref 3.5–5.0)
Alkaline Phosphatase: 62 U/L (ref 38–126)
Anion gap: 8 (ref 5–15)
BUN: 14 mg/dL (ref 8–23)
CO2: 26 mmol/L (ref 22–32)
Calcium: 8.9 mg/dL (ref 8.9–10.3)
Chloride: 105 mmol/L (ref 98–111)
Creatinine: 1.09 mg/dL (ref 0.61–1.24)
GFR, Estimated: >60 mL/min (ref >60)
Glucose, Bld: 92 mg/dL (ref 70–99)
Potassium: 4.4 mmol/L (ref 3.5–5.1)
Sodium: 139 mmol/L (ref 135–145)
Total Bilirubin: 0.8 mg/dL (ref 0.0–1.2)
Total Protein: 6.5 g/dL (ref 6.5–8.1)

## 2023-07-31 LAB — CEA (ACCESS): CEA (CHCC): <1 ng/mL (ref 0.00–5.00)

## 2023-07-31 NOTE — Progress Notes (Signed)
 Hematology and Oncology Follow Up Visit  John [name] [id number] 09-08-1955 68 y.o. 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  He comes every 6 months.  He is here with his wife.   Today he reports that he is doing well.   He has had no issues with cough or shortness of breath.    He does have an anterior abdominal wall hernia which he will have repaired on July 2nd at [facility].   His last CEA was less than 1.  His last Colonoscopy was March of 2023- He is slightly overdue but they wanted to repair his hernia first.   He has had no problems with bowels or bladder.  He has had no fever.  He has had no leg swelling.  He still has a neuropathy which is stable.   Currently, I would say that his performance status is ECOG 1.  Wt Readings from Last 3 Encounters:  07/31/23 265 lb 1.3 oz (120.2 kg)  01/30/23 260 lb (117.9 kg)  01/26/22 266 lb (120.7 kg)    Medications:  Current Outpatient Medications:    b complex vitamins capsule, Take 1 capsule by mouth daily., Disp: , Rfl:    cyanocobalamin (VITAMIN B12) 1000 MCG/ML injection, Inject 1,000 mcg into the muscle every 30 (thirty) days., Disp: , Rfl:    hydrOXYzine (ATARAX) 10 MG tablet, Take 10 mg by mouth at bedtime., Disp: , Rfl:    pantoprazole  (PROTONIX ) 40 MG tablet, Take 1 tablet (40 mg total) by mouth daily., Disp: 90 tablet, Rfl: 4   pregabalin (LYRICA) 75 MG capsule, Take 75 mg by mouth 2 (two) times daily., Disp: , Rfl:    sertraline (ZOLOFT) 50 MG tablet, Take 50 mg by mouth daily., Disp: , Rfl:    telmisartan (MICARDIS) 40 MG tablet, Take 40 mg by mouth daily., Disp: , Rfl:    VITAMIN D, CHOLECALCIFEROL, PO, Take 1,000 Int'l Units/day by mouth daily., Disp:  , Rfl:    melatonin 5 MG TABS, Take 5 mg by mouth at bedtime. (Patient not taking: Reported on 07/31/2023), Disp: , Rfl:   Allergies:  Allergies  Allergen Reactions   Other Swelling    Bee Sting     Erythromycin Nausea Only   Lisinopril Cough    Past Medical History, Surgical history, Social history, and Family History were reviewed and updated.  Review of Systems: Review of Systems  Constitutional: Negative.   HENT:  Negative.  Negative for voice change.   Eyes: Negative.   Respiratory: Negative.    Cardiovascular: Negative.   Gastrointestinal: Negative.   Endocrine: Negative.   Genitourinary: Negative.    Musculoskeletal: Negative.   Skin: Negative.   Neurological: Negative.   Hematological: Negative.   Psychiatric/Behavioral: Negative.      Physical Exam: Vitals:   07/31/23 1102  BP: 124/80  Pulse: (!) 56  Resp: 18  Temp: 99 F (37.2 C)  SpO2: 100%     Wt Readings from Last 3 Encounters:  07/31/23 265 lb 1.3 oz (120.2 kg)  01/30/23 260 lb (117.9 kg)  01/26/22 266 lb (120.7 kg)    Physical Exam Vitals reviewed.  HENT:     Head: Normocephalic and atraumatic.   Eyes:     Pupils: Pupils  are equal, round, and reactive to light.    Cardiovascular:     Rate and Rhythm: Normal rate and regular rhythm.     Heart sounds: Normal heart sounds.  Pulmonary:     Effort: Pulmonary effort is normal.     Breath sounds: Normal breath sounds.  Abdominal:     General: Bowel sounds are normal.     Palpations: Abdomen is soft.     Comments: Abdominal exam shows well-healed laparotomy scars.  He does have this anterior abdominal hernia just to the left of the umbilicus.  It is somewhat prominent.  It is not that reducible.     Musculoskeletal:        General: No tenderness or deformity. Normal range of motion.     Cervical back: Normal range of motion.  Lymphadenopathy:     Cervical: No cervical adenopathy.   Skin:    General: Skin is warm and dry.      Findings: No erythema or rash.   Neurological:     Mental Status: He is alert and oriented to person, place, and time.   Psychiatric:        Behavior: Behavior normal.        Thought Content: Thought content normal.        Judgment: Judgment normal.      Lab Results  Component Value Date   WBC 6.5 07/31/2023   HGB 13.6 07/31/2023   HCT 40.9 07/31/2023   MCV 93.4 07/31/2023   PLT 226 07/31/2023     Chemistry      Component Value Date/Time   NA 139 07/31/2023 1032   K 4.4 07/31/2023 1032   CL 105 07/31/2023 1032   CO2 26 07/31/2023 1032   BUN 14 07/31/2023 1032   CREATININE 1.09 07/31/2023 1032   CREATININE 1.14 12/04/2019 1310      Component Value Date/Time   CALCIUM  8.9 07/31/2023 1032   ALKPHOS 62 07/31/2023 1032   AST 22 07/31/2023 1032   ALT 30 07/31/2023 1032   BILITOT 0.8 07/31/2023 1032     No diagnosis found.  Impression and Plan: John [name] is a very nice 68 year old white male.  He has stage IIIb sigmoid colon cancer. He completed all his adjuvant chemotherapy back in May 2022. He has since had his port-a-cath removed. We now see him for surveillance.   Per NCCN Guidelines:  History and physical examination every 3-6 mo for 2 y, then every 6  mo for a total of 5 y  [place] every 3-6 mo for 2 y, then every 6 mo for a total of 5 y  C/A/P CT every 6-12 mo (category 2B for frequency <12 mo) from  date of surgery for a total of 5 y  Colonoscopy in 1 y after surgery except if no complete  preoperative colonoscopy, then colonoscopy in 3-6 mo  If advanced adenoma, repeat in 1 y  If no advanced adenoma repeat in 64 y, then every 5 ygg  FDG-PET/CT is not indicated  His last CT scan in our system was on 08/04/2022. But he had a CT C/A/P w/o contrast on 04/27/2023. This was done at an outside facility- [facility]- He will get us  a report of this scan. Given his labs, lack of symptoms we have elected to count this scan as his yearly scan. Should symptoms start  after his surgery we will proceed forward with a CT C/A/P w contrast.  CBC w/ diff is normal CMP is normal.  CEA pending  RTC 6 months MD, labs (CBC w/, CMP, CEA)   Sharla Davis, PA-C 6/16/202511:11 AM

## 2023-08-29 ENCOUNTER — Ambulatory Visit (AMBULATORY_SURGERY_CENTER): Admitting: *Deleted

## 2023-08-29 VITALS — Ht 76.0 in | Wt 255.0 lb

## 2023-08-29 DIAGNOSIS — Z83719 Family history of colon polyps, unspecified: Secondary | ICD-10-CM

## 2023-08-29 DIAGNOSIS — Z860100 Personal history of colon polyps, unspecified: Secondary | ICD-10-CM

## 2023-08-29 DIAGNOSIS — Z85038 Personal history of other malignant neoplasm of large intestine: Secondary | ICD-10-CM

## 2023-08-29 MED ORDER — NA SULFATE-K SULFATE-MG SULF 17.5-3.13-1.6 GM/177ML PO SOLN: 1.0000 | Freq: Once | ORAL | 0 refills | Status: AC

## 2023-08-29 NOTE — Progress Notes (Signed)
 Pt's name and DOB verified at the beginning of the pre-visit wit 2 identifiers  Permission to speak with Wife on speaker phone given  Pt denies any difficulty with ambulating,sitting, laying down or rolling side to side  Pt has no issues moving head neck or swallowing  No egg or soy allergy known to patient   No issues known to pt with past sedation with any surgeries or procedures  No FH of Malignant Hyperthermia  Pt is not on home 02   Pt is not on blood thinners   Pt denies issues with constipation   Pt is not on dialysis  Pt denise any abnormal heart rhythms   Pt denies any upcoming cardiac testing  Patient's chart reviewed by Norleen Schillings CNRA prior to pre-visit and patient appropriate for the [facility].  Pre-visit completed and red dot placed by patient's name on their procedure day (on provider's schedule).    Visit by phone  Pt states weight is 255 lb  IInstructions reviewed. Pt given  both [facility] main # and MD on call # prior to instructions.  Pt states understanding of instructions. Instructed pt to review instructions again prior to procedure and call main # given if has questions.. Pt states they will.   Instructed pt on where to find instructions on My Chart.

## 2023-09-06 ENCOUNTER — Other Ambulatory Visit: Payer: Self-pay | Admitting: Medical Genetics

## 2023-09-12 ENCOUNTER — Ambulatory Visit: Admitting: Gastroenterology

## 2023-09-12 ENCOUNTER — Encounter: Payer: Self-pay | Admitting: Gastroenterology

## 2023-09-12 VITALS — BP 133/72 | HR 59 | Temp 97.9°F | Resp 13 | Ht 76.0 in | Wt 255.0 lb

## 2023-09-12 DIAGNOSIS — Z980 Intestinal bypass and anastomosis status: Secondary | ICD-10-CM

## 2023-09-12 DIAGNOSIS — Z1211 Encounter for screening for malignant neoplasm of colon: Secondary | ICD-10-CM

## 2023-09-12 DIAGNOSIS — K635 Polyp of colon: Secondary | ICD-10-CM

## 2023-09-12 DIAGNOSIS — D125 Benign neoplasm of sigmoid colon: Secondary | ICD-10-CM

## 2023-09-12 DIAGNOSIS — K5730 Diverticulosis of large intestine without perforation or abscess without bleeding: Secondary | ICD-10-CM

## 2023-09-12 DIAGNOSIS — K642 Third degree hemorrhoids: Secondary | ICD-10-CM

## 2023-09-12 DIAGNOSIS — Z85038 Personal history of other malignant neoplasm of large intestine: Secondary | ICD-10-CM

## 2023-09-12 DIAGNOSIS — K648 Other hemorrhoids: Secondary | ICD-10-CM

## 2023-09-12 DIAGNOSIS — D124 Benign neoplasm of descending colon: Secondary | ICD-10-CM

## 2023-09-12 DIAGNOSIS — K644 Residual hemorrhoidal skin tags: Secondary | ICD-10-CM

## 2023-09-12 MED ORDER — SODIUM CHLORIDE 0.9 % IV SOLN: 500.0000 mL | Freq: Once | INTRAVENOUS | Status: DC

## 2023-09-12 NOTE — Progress Notes (Signed)
 Called to room to assist during endoscopic procedure.  Patient ID and intended procedure confirmed with present staff. Received instructions for my participation in the procedure from the performing physician.

## 2023-09-12 NOTE — Progress Notes (Signed)
 Vitals-DT  Pt's states no medical or surgical changes since previsit or office visit.

## 2023-09-12 NOTE — Op Note (Signed)
 [facility] Patient Name: John [name] Procedure Date: 09/12/2023 7:22 AM MRN: [id number] Endoscopist: Sandor Flatter , MD, [id number] Age: 68 Referring MD:  Date of Birth: 17-Jan-1956 Gender: Male Account #: [id number] Procedure:                Colonoscopy Indications:              High risk colon cancer surveillance: Personal                            history of colon cancer                           Diagnosed with stage III (T3 N1 M0) sigmoid colon                            cancer diagnosed on index colonoscopy in 2021, now                            s/ p sigmoid resection and adjuvant chemotherapy.                            Last colonoscopy in 04/2021 with 6 small 3-6 mm                            adenomas, healthy appearing sigmoid anastomosis at                            28 cm, left-sided diverticulosis, grade 3 internal                            hemorrhoids, with recommendation to repeat in 2                            years. Medicines:                Monitored Anesthesia Care Procedure:                Pre-Anesthesia Assessment:                           - Prior to the procedure, a History and Physical                            was performed, and patient medications and                            allergies were reviewed. The patient's tolerance of                            previous anesthesia was also reviewed. The risks                            and benefits of the procedure and the sedation  options and risks were discussed with the patient.                            All questions were answered, and informed consent                            was obtained. Prior Anticoagulants: The patient has                            taken no anticoagulant or antiplatelet agents. ASA                            Grade Assessment: II - A patient with mild systemic                            disease. After reviewing the risks and benefits,                             the patient was deemed in satisfactory condition to                            undergo the procedure.                           After obtaining informed consent, the colonoscope                            was passed under direct vision. Throughout the                            procedure, the patient's blood pressure, pulse, and                            oxygen saturations were monitored continuously. The                            CF [id number] #[id number] was introduced through the anus                            and advanced to the the cecum, identified by                            appendiceal orifice and ileocecal valve. The                            colonoscopy was performed without difficulty. The                            patient tolerated the procedure well. The quality                            of the bowel preparation was good. The ileocecal  valve, appendiceal orifice, and rectum were                            photographed. Scope In: 8:04:23 AM Scope Out: 8:16:29 AM Scope Withdrawal Time: 0 hours 10 minutes 3 seconds  Total Procedure Duration: 0 hours 12 minutes 6 seconds  Findings:                 Hemorrhoids were found on perianal exam.                           A 4 mm polyp was found in the descending colon. The                            polyp was sessile. The polyp was removed with a                            cold snare. Resection and retrieval were complete.                            Estimated blood loss was minimal.                           A 3 mm polyp was found in the sigmoid colon. The                            polyp was sessile. The polyp was removed with a                            cold snare. Resection and retrieval were complete.                            Estimated blood loss was minimal.                           A few small-mouthed diverticula were found in the                            sigmoid colon and distal  descending colon.                           There was evidence of a prior end-to-end                            colo-colonic anastomosis in the sigmoid colon. This                            was patent and was characterized by healthy                            appearing mucosa. The anastomosis was traversed.                           External and internal hemorrhoids were found during  retroflexion. The hemorrhoids were medium-sized. Complications:            No immediate complications. Estimated Blood Loss:     Estimated blood loss was minimal. Impression:               - Hemorrhoids found on perianal exam.                           - One 4 mm polyp in the descending colon, removed                            with a cold snare. Resected and retrieved.                           - One 3 mm polyp in the sigmoid colon, removed with                            a cold snare. Resected and retrieved.                           - Diverticulosis in the sigmoid colon and in the                            distal descending colon.                           - Patent end-to-end colo-colonic anastomosis,                            characterized by healthy appearing mucosa.                           - External and internal hemorrhoids. Recommendation:           - Patient has a contact number available for                            emergencies. The signs and symptoms of potential                            delayed complications were discussed with the                            patient. Return to normal activities tomorrow.                            Written discharge instructions were provided to the                            patient.                           - Resume previous diet.                           - Continue present medications.                           -  Await pathology results.                           - Repeat colonoscopy for surveillance based on                             pathology results.                           - Return to GI clinic PRN. Sandor Flatter, MD 09/12/2023 8:23:16 AM

## 2023-09-12 NOTE — Progress Notes (Signed)
 GASTROENTEROLOGY PROCEDURE H&P NOTE   Primary Care Physician: Lurena Shape, MD    Reason for Procedure:  Colon cancer surveillance  Plan:    Colonoscopy  Patient is appropriate for endoscopic procedure(s) in the ambulatory ([facility]) setting.  The nature of the procedure, as well as the risks, benefits, and alternatives were carefully and thoroughly reviewed with the patient. Ample time for discussion and questions allowed. The patient understood, was satisfied, and agreed to proceed.     HPI: John [name] is a 68 y.o. male who presents for colonoscopy for ongoing colon cancer surveillance.    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  Last colonoscopy in 04/2021 with 6 small 3-6 mm adenomas, healthy appearing sigmoid anastomosis at 28 cm, left-sided diverticulosis, grade 3 internal hemorrhoids, with recommendation to repeat in 2 years.  He was recently seen by General Surgery at [facility] earlier this month with plan for ventral hernia repair in October.  Surgeon recommended colonoscopy prior to hernia repair due to history of colon cancer and as part of preoperative evaluation.  Reviewed his last abdominal CT from 07/2022, notable for ventral hernia containing small bowel loops to the left of the umbilicus with no signs of incarceration or obstruction.  There was an additional CT done earlier this year at [facility].  Full report not available for review, but per surgical notes bowel containing 7 cm wide incisional hernia, prior intraperitoneal mesh noted superiorly.  No mention of colon within hernia and no obstruction.   Past Medical History:  Diagnosis Date   Allergy    Anemia    Arthritis    Cancer of right kidney (HCC)    Lt. nephrectomy   Colon cancer (HCC) 12/11/2019   Family history of colon cancer    Family history of prostate cancer    GERD (gastroesophageal reflux disease)     Hypertension    Lower GI bleed 12/11/2019   Multiple thyroid nodules    Personal history of kidney cancer    Stage III carcinoma of colon (HCC) 02/28/2020    Past Surgical History:  Procedure Laterality Date   CHOLECYSTECTOMY     COLONOSCOPY  12/04/2019   FLEXIBLE SIGMOIDOSCOPY N/A 01/31/2020   Procedure: FLEXIBLE SIGMOIDOSCOPY;  Surgeon: Teresa Lonni HERO, MD;  Location: [facility];  Service: General;  Laterality: N/A;   HERNIA REPAIR     INCISION AND DRAINAGE / EXCISION THYROGLOSSAL CYST     IR IMAGING GUIDED PORT INSERTION  03/05/2020   IR REMOVAL TUN ACCESS W/ PORT W/O FL MOD SED  02/11/2022   LYSIS OF ADHESION N/A 01/31/2020   Procedure: LYSIS OF ADHESION;  Surgeon: Teresa Lonni HERO, MD;  Location: [facility];  Service: General;  Laterality: N/A;   MENISCUS REPAIR Left    MENISECTOMY Right    NEPHRECTOMY RADICAL     REPLACEMENT TOTAL KNEE Right 12/10/2020   THYROIDECTOMY, PARTIAL     UPPER GASTROINTESTINAL ENDOSCOPY  12/04/2019    Prior to Admission medications   Medication Sig Start Date End Date Taking? Authorizing Provider  ascorbic acid (VITAMIN C) 1000 MG tablet Take 1,000 mg by mouth. 03/23/23   [provider]  b complex vitamins capsule Take 1 capsule by mouth daily.    [provider]  B-D INTEGRA SYRINGE 25G X 5/8 3 ML MISC USE TO INJECT B12 SHOT ONCE A MONTH 07/05/23   [provider]  cyanocobalamin (VITAMIN B12) 1000 MCG/ML injection Inject 1,000 mcg into the muscle every 30 (thirty) days. 01/12/22   [provider]  hydrOXYzine (ATARAX) 10 MG tablet Take 10 mg by mouth at bedtime. 11/15/21   [provider]  melatonin 5 MG TABS Take 5 mg by mouth at bedtime. Patient taking differently: Take 5 mg by mouth as needed.    [provider]  Omega-3 Fatty Acids (FISH OIL) 1000 MG CAPS Take by mouth daily at 12 noon.    [provider]  pantoprazole  (PROTONIX ) 40 MG tablet Take 1 tablet (40 mg total) by  mouth daily. 04/22/21   Suha Schoenbeck V, DO  pregabalin (LYRICA) 75 MG capsule Take 75 mg by mouth 2 (two) times daily. 11/15/21   [provider]  sertraline (ZOLOFT) 50 MG tablet Take 50 mg by mouth daily. 06/15/22   [provider]  telmisartan (MICARDIS) 40 MG tablet Take 40 mg by mouth daily. 09/09/19   [provider]  VITAMIN D, CHOLECALCIFEROL, PO Take 1,000 Int'l Units/day by mouth daily.    [provider]    Current Outpatient Medications  Medication Sig Dispense Refill   ascorbic acid (VITAMIN C) 1000 MG tablet Take 1,000 mg by mouth.     b complex vitamins capsule Take 1 capsule by mouth daily.     B-D INTEGRA SYRINGE 25G X 5/8 3 ML MISC USE TO INJECT B12 SHOT ONCE A MONTH     cyanocobalamin (VITAMIN B12) 1000 MCG/ML injection Inject 1,000 mcg into the muscle every 30 (thirty) days.     hydrOXYzine (ATARAX) 10 MG tablet Take 10 mg by mouth at bedtime.     melatonin 5 MG TABS Take 5 mg by mouth at bedtime. (Patient taking differently: Take 5 mg by mouth as needed.)     Omega-3 Fatty Acids (FISH OIL) 1000 MG CAPS Take by mouth daily at 12 noon.     pantoprazole  (PROTONIX ) 40 MG tablet Take 1 tablet (40 mg total) by mouth daily. 90 tablet 4   pregabalin (LYRICA) 75 MG capsule Take 75 mg by mouth 2 (two) times daily.     sertraline (ZOLOFT) 50 MG tablet Take 50 mg by mouth daily.     telmisartan (MICARDIS) 40 MG tablet Take 40 mg by mouth daily.     VITAMIN D, CHOLECALCIFEROL, PO Take 1,000 Int'l Units/day by mouth daily.     No current facility-administered medications for this visit.    Allergies as of 09/12/2023 - Review Complete 08/29/2023  Allergen Reaction Noted   Other Swelling 12/23/2019   Erythromycin Nausea Only 12/23/2019   Lisinopril Cough 10/31/2019    Family History  Problem Relation Age of Onset   Valvular heart disease Father    Colon cancer Maternal Grandfather 37   Prostate cancer Maternal Grandfather 62   Stroke  Maternal Grandfather    Liver cancer Paternal Uncle        probable mets, unknown primary   Dementia Paternal Uncle    Stroke Maternal Grandmother    Stroke Paternal Grandmother    AAA (abdominal aortic aneurysm) Paternal Grandmother    Dementia Paternal Grandfather    Other Paternal Uncle        Amyloidosis   Colon polyps Neg Hx    Esophageal cancer Neg Hx    Rectal cancer Neg Hx    Stomach cancer Neg Hx     Social History   Socioeconomic History   Marital status: Married  Spouse name: Not on file   Number of children: Not on file   Years of education: Not on file   Highest education level: Not on file  Occupational History   Not on file  Tobacco Use   Smoking status: Former    Current packs/day: 0.00    Types: Cigarettes    Quit date: 37    Years since quitting: 32.5   Smokeless tobacco: Never  Vaping Use   Vaping status: Never Used  Substance and Sexual Activity   Alcohol use: Yes    Comment: occ   Drug use: Never   Sexual activity: Not on file  Other Topics Concern   Not on file  Social History Narrative   Not on file   Social Drivers of Health   Financial Resource Strain: Not on file  Food Insecurity: Not on file  Transportation Needs: Not on file  Physical Activity: Not on file  Stress: Not on file  Social Connections: Not on file  Intimate Partner Violence: Not on file    Physical Exam: Vital signs in last 24 hours: @There  were no vitals taken for this visit. GEN: NAD EYE: Sclerae anicteric ENT: MMM CV: Non-tachycardic Pulm: CTA b/l GI: Soft, NT/ND NEURO:  Alert & Oriented x 3   Sandor Flatter, DO [facility]   09/12/2023 7:10 AM

## 2023-09-12 NOTE — Patient Instructions (Signed)

## 2023-09-12 NOTE — Progress Notes (Signed)
 Report to PACU, RN, vss, BBS= Clear.

## 2023-09-13 ENCOUNTER — Telehealth: Payer: Self-pay

## 2023-09-13 NOTE — Telephone Encounter (Signed)
 Left message

## 2023-09-14 LAB — SURGICAL PATHOLOGY

## 2023-09-18 ENCOUNTER — Ambulatory Visit: Payer: Self-pay | Admitting: Gastroenterology

## 2023-12-11 ENCOUNTER — Other Ambulatory Visit: Payer: Self-pay | Admitting: Medical Genetics

## 2023-12-11 DIAGNOSIS — Z006 Encounter for examination for normal comparison and control in clinical research program: Secondary | ICD-10-CM

## 2024-01-15 LAB — GENECONNECT MOLECULAR SCREEN: Genetic Analysis Overall Interpretation: NEGATIVE

## 2024-01-29 ENCOUNTER — Inpatient Hospital Stay: Admitting: Hematology & Oncology

## 2024-01-29 ENCOUNTER — Inpatient Hospital Stay: Attending: Hematology & Oncology

## 2024-01-29 ENCOUNTER — Encounter: Payer: Self-pay | Admitting: Hematology & Oncology

## 2024-01-29 ENCOUNTER — Other Ambulatory Visit: Payer: Self-pay

## 2024-01-29 VITALS — BP 146/65 | HR 49 | Temp 98.1°F | Resp 18 | Ht 76.0 in | Wt 272.0 lb

## 2024-01-29 DIAGNOSIS — C185 Malignant neoplasm of splenic flexure: Secondary | ICD-10-CM

## 2024-01-29 DIAGNOSIS — Z91030 Bee allergy status: Secondary | ICD-10-CM | POA: Diagnosis not present

## 2024-01-29 DIAGNOSIS — C189 Malignant neoplasm of colon, unspecified: Secondary | ICD-10-CM

## 2024-01-29 DIAGNOSIS — K439 Ventral hernia without obstruction or gangrene: Secondary | ICD-10-CM | POA: Insufficient documentation

## 2024-01-29 DIAGNOSIS — Z79899 Other long term (current) drug therapy: Secondary | ICD-10-CM | POA: Insufficient documentation

## 2024-01-29 DIAGNOSIS — C187 Malignant neoplasm of sigmoid colon: Secondary | ICD-10-CM | POA: Insufficient documentation

## 2024-01-29 DIAGNOSIS — C184 Malignant neoplasm of transverse colon: Secondary | ICD-10-CM

## 2024-01-29 DIAGNOSIS — Z881 Allergy status to other antibiotic agents status: Secondary | ICD-10-CM | POA: Diagnosis not present

## 2024-01-29 DIAGNOSIS — Z79890 Hormone replacement therapy (postmenopausal): Secondary | ICD-10-CM | POA: Diagnosis not present

## 2024-01-29 DIAGNOSIS — Z888 Allergy status to other drugs, medicaments and biological substances status: Secondary | ICD-10-CM | POA: Diagnosis not present

## 2024-01-29 LAB — CBC WITH DIFFERENTIAL (CANCER CENTER ONLY)
Abs Immature Granulocytes: 0.04 K/uL (ref 0.00–0.07)
Basophils Absolute: 0 K/uL (ref 0.0–0.1)
Basophils Relative: 1 %
Eosinophils Absolute: 0.1 K/uL (ref 0.0–0.5)
Eosinophils Relative: 1 %
HCT: 42.1 % (ref 39.0–52.0)
Hemoglobin: 14.1 g/dL (ref 13.0–17.0)
Immature Granulocytes: 1 %
Lymphocytes Relative: 33 %
Lymphs Abs: 2.2 K/uL (ref 0.7–4.0)
MCH: 31.3 pg (ref 26.0–34.0)
MCHC: 33.5 g/dL (ref 30.0–36.0)
MCV: 93.6 fL (ref 80.0–100.0)
Monocytes Absolute: 0.6 K/uL (ref 0.1–1.0)
Monocytes Relative: 8 %
Neutro Abs: 3.8 K/uL (ref 1.7–7.7)
Neutrophils Relative %: 56 %
Platelet Count: 243 K/uL (ref 150–400)
RBC: 4.5 MIL/uL (ref 4.22–5.81)
RDW: 13.2 % (ref 11.5–15.5)
WBC Count: 6.7 K/uL (ref 4.0–10.5)
nRBC: 0 % (ref 0.0–0.2)

## 2024-01-29 LAB — CMP (CANCER CENTER ONLY)
ALT: 59 U/L — ABNORMAL HIGH (ref 0–44)
AST: 39 U/L (ref 15–41)
Albumin: 4.2 g/dL (ref 3.5–5.0)
Alkaline Phosphatase: 67 U/L (ref 38–126)
Anion gap: 12 (ref 5–15)
BUN: 12 mg/dL (ref 8–23)
CO2: 25 mmol/L (ref 22–32)
Calcium: 9.3 mg/dL (ref 8.9–10.3)
Chloride: 103 mmol/L (ref 98–111)
Creatinine: 1.14 mg/dL (ref 0.61–1.24)
GFR, Estimated: >60 mL/min (ref >60)
Glucose, Bld: 101 mg/dL — ABNORMAL HIGH (ref 70–99)
Potassium: 4.5 mmol/L (ref 3.5–5.1)
Sodium: 140 mmol/L (ref 135–145)
Total Bilirubin: 0.7 mg/dL (ref 0.0–1.2)
Total Protein: 7 g/dL (ref 6.5–8.1)

## 2024-01-29 LAB — CEA (ACCESS): CEA (CHCC): <1 ng/mL (ref 0.00–5.00)

## 2024-01-29 NOTE — Progress Notes (Signed)
 Hematology and Oncology Follow Up Visit  John [name] [id number] 1956/02/04 68 y.o. 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  He comes every 6 months.  He is here with his wife.  He looks quite good.  He did undergo anterior abdominal wall hernia surgery..  This was done at [facility].  This was done in the October.  He did quite well with this.  This is all done laparoscopically.  As far as colon cancer concerned, this really is not a problem.  He completed all of his treatment back in 2022.  His last CEA level was less than 1.  He had a colonoscopy on 09/12/2023.  Some small polyps were found.  These were hyperplastic polyps.  I am sure that his next colonoscopy will probably be in 3 years.  He has had no issues with nausea or vomiting.  He has had no change in bowel or bladder habits.  He has had no fever.  He has had no bleeding.  He has had no cough or shortness of breath.  There is been no leg swelling.  Overall, I will say that his performance status is probably ECOG 0.   Wt Readings from Last 3 Encounters:  01/29/24 272 lb (123.4 kg)  09/12/23 255 lb (115.7 kg)  08/29/23 255 lb (115.7 kg)    Medications:  Current Outpatient Medications:    ascorbic acid (VITAMIN C) 1000 MG tablet, Take 1,000 mg by mouth., Disp: , Rfl:    b complex vitamins capsule, Take 1 capsule by mouth daily., Disp: , Rfl:    B-D INTEGRA SYRINGE 25G X 5/8 3 ML MISC, USE TO INJECT B12 SHOT ONCE A MONTH, Disp: , Rfl:    cyanocobalamin (VITAMIN B12) 1000 MCG/ML injection, Inject 1,000 mcg into the muscle every 30 (thirty) days., Disp: , Rfl:    hydrOXYzine (ATARAX) 10 MG tablet, Take 10 mg by mouth at bedtime., Disp: , Rfl:    levothyroxine  (SYNTHROID) 25 MCG tablet, Take 25 mcg by mouth daily., Disp: , Rfl:    melatonin 5 MG TABS, Take 5 mg by mouth at bedtime. (Patient taking differently: Take 5 mg by mouth as needed.), Disp: , Rfl:    Omega-3 Fatty Acids (FISH OIL) 1000 MG CAPS, Take by mouth daily at 12 noon., Disp: , Rfl:    pantoprazole  (PROTONIX ) 40 MG tablet, Take 1 tablet (40 mg total) by mouth daily., Disp: 90 tablet, Rfl: 4   pregabalin (LYRICA) 75 MG capsule, Take 75 mg by mouth 2 (two) times daily., Disp: , Rfl:    sertraline (ZOLOFT) 50 MG tablet, Take 50 mg by mouth daily., Disp: , Rfl:    telmisartan (MICARDIS) 40 MG tablet, Take 40 mg by mouth daily., Disp: , Rfl:    VITAMIN D, CHOLECALCIFEROL, PO, Take 1,000 Int'l Units/day by mouth daily., Disp: , Rfl:   Allergies:  Allergies  Allergen Reactions   Other Swelling    Bee Sting     Erythromycin Nausea Only   Lisinopril Cough    Past Medical History, Surgical history, Social history, and Family History were reviewed and updated.  Review of Systems: Review of Systems  Constitutional: Negative.   HENT:  Negative.  Negative for voice change.   Eyes: Negative.  Respiratory: Negative.    Cardiovascular: Negative.   Gastrointestinal: Negative.   Endocrine: Negative.   Genitourinary: Negative.    Musculoskeletal: Negative.   Skin: Negative.   Neurological: Negative.   Hematological: Negative.   Psychiatric/Behavioral: Negative.      Physical Exam: Vitals:   01/29/24 1100  BP: (!) 146/65  Pulse: (!) 49  Resp: 18  Temp: 98.1 F (36.7 C)  SpO2: 98%     Wt Readings from Last 3 Encounters:  01/29/24 272 lb (123.4 kg)  09/12/23 255 lb (115.7 kg)  08/29/23 255 lb (115.7 kg)    Physical Exam Vitals reviewed.  HENT:     Head: Normocephalic and atraumatic.  Eyes:     Pupils: Pupils are equal, round, and reactive to light.  Cardiovascular:     Rate and Rhythm: Normal rate and regular rhythm.     Heart sounds: Normal heart sounds.   Pulmonary:     Effort: Pulmonary effort is normal.     Breath sounds: Normal breath sounds.  Abdominal:     General: Bowel sounds are normal.     Palpations: Abdomen is soft.     Comments: Abdominal exam shows well-healed laparotomy scars.  He does have this anterior abdominal hernia just to the left of the umbilicus.  It is somewhat prominent.  It is not that reducible.    Musculoskeletal:        General: No tenderness or deformity. Normal range of motion.     Cervical back: Normal range of motion.  Lymphadenopathy:     Cervical: No cervical adenopathy.  Skin:    General: Skin is warm and dry.     Findings: No erythema or rash.  Neurological:     Mental Status: He is alert and oriented to person, place, and time.  Psychiatric:        Behavior: Behavior normal.        Thought Content: Thought content normal.        Judgment: Judgment normal.      Lab Results  Component Value Date   WBC 6.7 01/29/2024   HGB 14.1 01/29/2024   HCT 42.1 01/29/2024   MCV 93.6 01/29/2024   PLT 243 01/29/2024     Chemistry      Component Value Date/Time   NA 140 01/29/2024 1016   K 4.5 01/29/2024 1016   CL 103 01/29/2024 1016   CO2 25 01/29/2024 1016   BUN 12 01/29/2024 1016   CREATININE 1.14 01/29/2024 1016   CREATININE 1.14 12/04/2019 1310      Component Value Date/Time   CALCIUM  9.3 01/29/2024 1016   ALKPHOS 67 01/29/2024 1016   AST 39 01/29/2024 1016   ALT 59 (H) 01/29/2024 1016   BILITOT 0.7 01/29/2024 1016       Impression and Plan: John [name] is a very nice 68year-old white male.  He has stage IIIb sigmoid colon cancer. He completed all his adjuvant chemotherapy back in May 2022. He has since had his port-a-cath removed. We now see him for surveillance.   We will see about another CT scan will see him back in June.  We will see what his CEA level is.  Otherwise, I do not see that we have to do anything else different with him.     Maude JONELLE Crease, MD 12/15/202511:58  AM

## 2024-01-29 NOTE — Addendum Note (Signed)
 Addended by: TIMMY COY R on: 01/29/2024 12:16 PM   Modules accepted: Orders

## 2024-01-30 ENCOUNTER — Inpatient Hospital Stay

## 2024-01-30 ENCOUNTER — Ambulatory Visit: Admitting: Hematology & Oncology

## 2024-07-29 ENCOUNTER — Inpatient Hospital Stay

## 2024-07-29 ENCOUNTER — Other Ambulatory Visit (HOSPITAL_BASED_OUTPATIENT_CLINIC_OR_DEPARTMENT_OTHER)

## 2024-07-29 ENCOUNTER — Inpatient Hospital Stay: Admitting: Hematology & Oncology
